# Patient Record
Sex: Male | Born: 1970 | Race: Black or African American | Hispanic: No | Marital: Married | Smoking: Former smoker
Health system: Southern US, Community
[De-identification: ages and names within clinical notes are randomized; demographics above are authoritative.]

## PROBLEM LIST (undated history)

## (undated) DIAGNOSIS — G4730 Sleep apnea, unspecified: Secondary | ICD-10-CM

## (undated) DIAGNOSIS — E785 Hyperlipidemia, unspecified: Secondary | ICD-10-CM

## (undated) DIAGNOSIS — K219 Gastro-esophageal reflux disease without esophagitis: Secondary | ICD-10-CM

## (undated) DIAGNOSIS — I10 Essential (primary) hypertension: Secondary | ICD-10-CM

## (undated) DIAGNOSIS — K760 Fatty (change of) liver, not elsewhere classified: Secondary | ICD-10-CM

## (undated) DIAGNOSIS — Z9989 Dependence on other enabling machines and devices: Secondary | ICD-10-CM

## (undated) DIAGNOSIS — G4733 Obstructive sleep apnea (adult) (pediatric): Secondary | ICD-10-CM

## (undated) DIAGNOSIS — M1990 Unspecified osteoarthritis, unspecified site: Secondary | ICD-10-CM

## (undated) DIAGNOSIS — I517 Cardiomegaly: Secondary | ICD-10-CM

## (undated) DIAGNOSIS — L409 Psoriasis, unspecified: Secondary | ICD-10-CM

## (undated) DIAGNOSIS — R011 Cardiac murmur, unspecified: Secondary | ICD-10-CM

## (undated) HISTORY — DX: Hyperlipidemia, unspecified: E78.5

## (undated) HISTORY — DX: Obstructive sleep apnea (adult) (pediatric): G47.33

## (undated) HISTORY — PX: CARDIAC CATHETERIZATION: SHX172

## (undated) HISTORY — DX: Unspecified osteoarthritis, unspecified site: M19.90

## (undated) HISTORY — DX: Gastro-esophageal reflux disease without esophagitis: K21.9

## (undated) HISTORY — DX: Cardiomegaly: I51.7

## (undated) HISTORY — PX: INGUINAL HERNIA REPAIR: SUR1180

## (undated) HISTORY — DX: Psoriasis, unspecified: L40.9

## (undated) HISTORY — DX: Dependence on other enabling machines and devices: Z99.89

## (undated) HISTORY — DX: Cardiac murmur, unspecified: R01.1

## (undated) HISTORY — DX: Fatty (change of) liver, not elsewhere classified: K76.0

## (undated) HISTORY — DX: Sleep apnea, unspecified: G47.30

## (undated) HISTORY — DX: Essential (primary) hypertension: I10

---

## 1999-05-17 ENCOUNTER — Ambulatory Visit (HOSPITAL_COMMUNITY): Admission: RE | Admit: 1999-05-17 | Discharge: 1999-05-17 | Payer: Self-pay | Admitting: Specialist

## 1999-05-17 ENCOUNTER — Encounter: Payer: Self-pay | Admitting: Specialist

## 2002-04-11 ENCOUNTER — Encounter: Payer: Self-pay | Admitting: Family Medicine

## 2002-04-11 ENCOUNTER — Ambulatory Visit (HOSPITAL_COMMUNITY): Admission: RE | Admit: 2002-04-11 | Discharge: 2002-04-11 | Payer: Self-pay | Admitting: Family Medicine

## 2003-10-11 ENCOUNTER — Ambulatory Visit (HOSPITAL_BASED_OUTPATIENT_CLINIC_OR_DEPARTMENT_OTHER): Admission: RE | Admit: 2003-10-11 | Discharge: 2003-10-11 | Payer: Self-pay | Admitting: *Deleted

## 2005-02-16 ENCOUNTER — Encounter (INDEPENDENT_AMBULATORY_CARE_PROVIDER_SITE_OTHER): Payer: Self-pay | Admitting: Specialist

## 2005-02-16 ENCOUNTER — Ambulatory Visit (HOSPITAL_COMMUNITY): Admission: RE | Admit: 2005-02-16 | Discharge: 2005-02-16 | Payer: Self-pay | Admitting: Otolaryngology

## 2005-02-16 ENCOUNTER — Ambulatory Visit (HOSPITAL_BASED_OUTPATIENT_CLINIC_OR_DEPARTMENT_OTHER): Admission: RE | Admit: 2005-02-16 | Discharge: 2005-02-16 | Payer: Self-pay | Admitting: Otolaryngology

## 2008-07-30 DIAGNOSIS — I517 Cardiomegaly: Secondary | ICD-10-CM

## 2008-07-30 DIAGNOSIS — G4733 Obstructive sleep apnea (adult) (pediatric): Secondary | ICD-10-CM

## 2008-07-30 HISTORY — DX: Cardiomegaly: I51.7

## 2008-07-30 HISTORY — DX: Obstructive sleep apnea (adult) (pediatric): G47.33

## 2009-01-14 ENCOUNTER — Ambulatory Visit: Payer: Self-pay | Admitting: Vascular Surgery

## 2009-03-26 ENCOUNTER — Encounter: Admission: RE | Admit: 2009-03-26 | Discharge: 2009-03-26 | Payer: Self-pay | Admitting: Rheumatology

## 2009-08-26 ENCOUNTER — Encounter: Admission: RE | Admit: 2009-08-26 | Discharge: 2009-08-26 | Payer: Self-pay | Admitting: Cardiology

## 2009-09-09 ENCOUNTER — Encounter: Admission: RE | Admit: 2009-09-09 | Discharge: 2009-09-09 | Payer: Self-pay | Admitting: Cardiology

## 2010-03-31 ENCOUNTER — Emergency Department (HOSPITAL_COMMUNITY): Admission: EM | Admit: 2010-03-31 | Discharge: 2010-03-31 | Payer: Self-pay | Admitting: Emergency Medicine

## 2010-08-20 ENCOUNTER — Encounter: Payer: Self-pay | Admitting: Cardiology

## 2010-12-12 NOTE — Procedures (Signed)
DUPLEX DEEP VENOUS EXAM - LOWER EXTREMITY   INDICATION:  Left lower extremity pain and swelling.   HISTORY:  Edema:  Yes.  Trauma/Surgery:  No.  Pain:  Yes.  PE:  No.  Previous DVT:  No.  Anticoagulants:  None.  Other:   DUPLEX EXAM:                CFV   SFV   PopV  PTV    GSV                R  L  R  L  R  L  R   L  R  L  Thrombosis    o  o     o     o      o     o  Spontaneous   +  +     +     +      +     +  Phasic        +  +     +     +      +     +  Augmentation  +  +     +     +      +     +  Compressible  +  +     +     +      +     +  Competent     +  +     +     +      +     +   Legend:  + - yes  o - no  p - partial  D - decreased   IMPRESSION:  No evidence of deep venous thrombosis noted in the left  leg.   Left a message on [employer], the nurse, answering machine.    _____________________________  Quita Skye. Hart Rochester, M.D.   MG/MEDQ  D:  01/14/2009  T:  01/14/2009  Job:  [id number]

## 2010-12-15 NOTE — Op Note (Signed)
NAMEEDITH, LORD             ACCOUNT NO.:  [id number]   MEDICAL RECORD NO.:  [id number]          PATIENT TYPE:  AMB   LOCATION:  DSC                          FACILITY:  [facility]   PHYSICIAN:  Christopher E. Ezzard Standing, M.D.DATE OF BIRTH:  06/07/1971   DATE OF PROCEDURE:  02/16/2005  DATE OF DISCHARGE:                                 OPERATIVE REPORT   PREOPERATIVE DIAGNOSIS:  Submental mass.   POSTOPERATIVE DIAGNOSIS:  Submental mass consistent with submental lymph  nodes.   OPERATION:  Excision of submental mass.   SURGEON:  Kristine Garbe. Ezzard Standing, M.D.   ANESTHESIA:  General endotracheal anesthesia.   COMPLICATIONS:  None.   BRIEF CLINICAL NOTE:  Erick Murin is a 40 year old gentleman who has had  a mass underneath his chin in the submental crease region for about two  months.  On examination, he has approximately 2 to 2.5 cm submental firm  mass, questionable cyst.  He was taken to the operating room for excisional  biopsy of a submental mass.   DESCRIPTION OF PROCEDURE:  Patient was brought to the operating room, placed  under general endotracheal anesthesia and given 1 g Ancef IV preoperatively.  Neck was prepped with Betadine solution and draped out with sterile towels.  An incision was made in a horizontal fashion directly over the mass.  Dissection was carried down through the subcutaneous tissue and the platysma  muscle.  Mass was found just beneath the platysma muscle and was consistent  with probable large lymph node.  There was an additional separate enlarged  lymph node that was removed also.  Hemostasis was obtained with the cautery.  The lymph nodes were sent in saline as fresh specimens to pathology.  After  obtaining adequate hemostasis, the wound was closed with 3-0 chromic sutures  subcutaneously and 5-0 nylon to reapproximate the skin edges.  Dressing was  applied.  The patient was awoken from anesthesia and transferred to the  recovery room  postoperatively doing well.   DISPOSITION:  Marlan is discharged home later this morning on Tylenol and  Tylenol No. 3 p.r.n. pain.  Will have him follow up in my office in one week  for recheck, review pathology and have sutures removed.        CEN/MEDQ  D:  02/16/2005  T:  02/16/2005  Job:  [id number]   cc:   Caryn Bee L. Little, M.D.  [address]  [place]  [place] [address]  Fax: [phone]

## 2010-12-19 ENCOUNTER — Ambulatory Visit: Payer: Self-pay | Admitting: Internal Medicine

## 2011-01-12 ENCOUNTER — Encounter: Payer: Self-pay | Admitting: *Deleted

## 2011-01-12 ENCOUNTER — Encounter: Payer: Self-pay | Admitting: Internal Medicine

## 2011-01-12 ENCOUNTER — Ambulatory Visit (INDEPENDENT_AMBULATORY_CARE_PROVIDER_SITE_OTHER): Payer: PRIVATE HEALTH INSURANCE | Admitting: Internal Medicine

## 2011-01-12 DIAGNOSIS — Z23 Encounter for immunization: Secondary | ICD-10-CM

## 2011-01-12 DIAGNOSIS — R011 Cardiac murmur, unspecified: Secondary | ICD-10-CM | POA: Insufficient documentation

## 2011-01-12 DIAGNOSIS — R05 Cough: Secondary | ICD-10-CM

## 2011-01-12 DIAGNOSIS — M79673 Pain in unspecified foot: Secondary | ICD-10-CM | POA: Insufficient documentation

## 2011-01-12 DIAGNOSIS — G4733 Obstructive sleep apnea (adult) (pediatric): Secondary | ICD-10-CM | POA: Insufficient documentation

## 2011-01-12 DIAGNOSIS — Z9989 Dependence on other enabling machines and devices: Secondary | ICD-10-CM | POA: Insufficient documentation

## 2011-01-12 DIAGNOSIS — I517 Cardiomegaly: Secondary | ICD-10-CM

## 2011-01-12 DIAGNOSIS — R059 Cough, unspecified: Secondary | ICD-10-CM

## 2011-01-12 DIAGNOSIS — W57XXXA Bitten or stung by nonvenomous insect and other nonvenomous arthropods, initial encounter: Secondary | ICD-10-CM | POA: Insufficient documentation

## 2011-01-12 DIAGNOSIS — L408 Other psoriasis: Secondary | ICD-10-CM

## 2011-01-12 DIAGNOSIS — Z0000 Encounter for general adult medical examination without abnormal findings: Secondary | ICD-10-CM | POA: Insufficient documentation

## 2011-01-12 DIAGNOSIS — T148XXA Other injury of unspecified body region, initial encounter: Secondary | ICD-10-CM

## 2011-01-12 DIAGNOSIS — T148 Other injury of unspecified body region: Secondary | ICD-10-CM

## 2011-01-12 DIAGNOSIS — L409 Psoriasis, unspecified: Secondary | ICD-10-CM

## 2011-01-12 DIAGNOSIS — M79609 Pain in unspecified limb: Secondary | ICD-10-CM

## 2011-01-12 DIAGNOSIS — I10 Essential (primary) hypertension: Secondary | ICD-10-CM

## 2011-01-12 LAB — CBC WITH DIFFERENTIAL/PLATELET
Basophils Absolute: 0 10*3/uL (ref 0.0–0.1)
Eosinophils Relative: 3.7 % (ref 0.0–5.0)
HCT: 48.5 % (ref 39.0–52.0)
Hemoglobin: 16.5 g/dL (ref 13.0–17.0)
Lymphs Abs: 2 10*3/uL (ref 0.7–4.0)
MCV: 88.8 fl (ref 78.0–100.0)
Monocytes Absolute: 0.4 10*3/uL (ref 0.1–1.0)
Monocytes Relative: 7.3 % (ref 3.0–12.0)
Neutro Abs: 3.3 10*3/uL (ref 1.4–7.7)
RDW: 14.1 % (ref 11.5–14.6)

## 2011-01-12 LAB — LIPID PANEL
Cholesterol: 211 mg/dL — ABNORMAL HIGH (ref 0–200)
Total CHOL/HDL Ratio: 4

## 2011-01-12 LAB — COMPREHENSIVE METABOLIC PANEL
Albumin: 4.3 g/dL (ref 3.5–5.2)
Alkaline Phosphatase: 75 U/L (ref 39–117)
BUN: 13 mg/dL (ref 6–23)
CO2: 28 mEq/L (ref 19–32)
Calcium: 9.3 mg/dL (ref 8.4–10.5)
Chloride: 106 mEq/L (ref 96–112)
GFR: 117.06 mL/min (ref >60.00)
Glucose, Bld: 79 mg/dL (ref 70–99)
Potassium: 4.8 mEq/L (ref 3.5–5.1)
Sodium: 140 mEq/L (ref 135–145)
Total Protein: 7.4 g/dL (ref 6.0–8.3)

## 2011-01-12 MED ORDER — HYDROCHLOROTHIAZIDE 25 MG PO TABS: 25.0000 mg | ORAL_TABLET | Freq: Every day | ORAL | Status: DC

## 2011-01-12 MED ORDER — LISINOPRIL 40 MG PO TABS: 40.0000 mg | ORAL_TABLET | Freq: Every day | ORAL | Status: DC

## 2011-01-12 MED ORDER — AZITHROMYCIN 250 MG PO TABS: ORAL_TABLET | ORAL | Status: AC

## 2011-01-12 NOTE — Assessment & Plan Note (Signed)
Reports history of cardiomegaly, and has been seen by cardiology at [facility] many times, needs a new cardiologist. Today he seems to be stable from the cardiac standpoint. Plan: Refer to cardiology Record

## 2011-01-12 NOTE — Assessment & Plan Note (Addendum)
Td today  never had a cscope Labs including a uric acid, patient likes to be tested for gout. Diet, exercise discussed

## 2011-01-12 NOTE — Assessment & Plan Note (Signed)
BP today well controlled

## 2011-01-12 NOTE — Assessment & Plan Note (Signed)
Recommend observation, will call if he develops a rash

## 2011-01-12 NOTE — Patient Instructions (Addendum)
Please sign a release of information and get the records from your previous doctors Rest, fluids , tylenol For cough, take Mucinex DM twice a day as needed  Take the antibiotic as prescribed. Take zithromax x 5 days  Call if no better in few days Call anytime if the symptoms increase (cough, chest congestion)

## 2011-01-12 NOTE — Assessment & Plan Note (Signed)
Plantar fascia is? Stretching discussed

## 2011-01-12 NOTE — Assessment & Plan Note (Signed)
Referred to a new dermatologist

## 2011-01-12 NOTE — Progress Notes (Signed)
  Subjective:    Patient ID: Jacob Cannon, male    DOB: 02-04-1971, 40 y.o.   MRN: [id number]  HPI CPX New patient, here with his wife. He has been unable to see his previous doctors due to an insurance issue. He used to be seen @ [facility]. Needs a referral to a new heart doctor and a dermatologist  Past Medical History  Diagnosis Date  . Hypertension   . Arthritis     type?  . Psoriasis     used to see derm   . Heart murmur congenital  . OSA on CPAP 2010  . Cardiomegaly 2010    [facility]   Past Surgical History  Procedure Date  . Hernia repair     R inguinal   Family History  Problem Relation Age of Onset  . Multiple sclerosis Mother   . Arthritis    . Heart disease      GM, father , brother (early age)  . Diabetes      father   . Colon cancer Neg Hx   . Prostate cancer Neg Hx    History   Social History  . Marital Status: Single    Spouse Name: N/A    Number of Children: 3  . Years of Education: N/A   Occupational History  .  [employer]   Social History Main Topics  . Smoking status: Never Smoker   . Smokeless tobacco: Not on file  . Alcohol Use: Yes     wine- occasionally  . Drug Use: No  . Sexually Active: Not on file   Other Topics Concern  . Not on file   Social History Narrative   3 biological kids and 5 adopted---    Review of Systems For one month, he has been coughing a lot, he has noted some chest congestion and occasional wheezing. He is producing green sputum. Last week, he had episode of sore throat, nausea some fever--those symptoms are resolved. Occasional short of breath with cough. He has ongoing chest pain, has been seeing cardiology for that, reports a stress test recently. Denies any abdominal pain or blood in the stools. Denies dysuria or gross hematuria. Complaining of right heel pain for a while. Over-the-counter medicines have not helped much. Yesterday he found a tick on the right scrotum, he showed to me, is 2  mm, not engorged     Objective:   Physical Exam  Constitutional: He appears well-developed. No distress.  HENT:  Head: Normocephalic and atraumatic.  Eyes: No scleral icterus.  Neck: No thyromegaly present.  Cardiovascular: Normal rate, regular rhythm and normal heart sounds.   No murmur heard. Pulmonary/Chest: Effort normal. No respiratory distress. He has no wheezes.       Few rhonchi  Abdominal: Soft. He exhibits no distension. There is no tenderness. There is no rebound and no guarding.  Musculoskeletal: He exhibits no edema.       Palpation of the right heel cause some pain  Skin: He is not diaphoretic.       Area of the tick bite is in the right scrotum, no lesions, no rash. Multiple skin lesions consistent with history of psoriasis          Assessment & Plan:  Today , I spent more than 20 min with the patient in addition of CPX addressing other  Issues (see a/p)

## 2011-01-15 DIAGNOSIS — R059 Cough, unspecified: Secondary | ICD-10-CM | POA: Insufficient documentation

## 2011-01-15 DIAGNOSIS — R05 Cough: Secondary | ICD-10-CM | POA: Insufficient documentation

## 2011-01-15 NOTE — Assessment & Plan Note (Signed)
Bronchitis: See instructions 

## 2011-02-09 ENCOUNTER — Encounter: Payer: Self-pay | Admitting: *Deleted

## 2011-02-09 ENCOUNTER — Encounter: Payer: Self-pay | Admitting: Cardiology

## 2011-02-12 ENCOUNTER — Institutional Professional Consult (permissible substitution): Payer: PRIVATE HEALTH INSURANCE | Admitting: Cardiology

## 2011-04-06 ENCOUNTER — Institutional Professional Consult (permissible substitution): Payer: PRIVATE HEALTH INSURANCE | Admitting: Cardiology

## 2011-04-20 ENCOUNTER — Encounter: Payer: Self-pay | Admitting: Cardiology

## 2011-04-20 ENCOUNTER — Ambulatory Visit (INDEPENDENT_AMBULATORY_CARE_PROVIDER_SITE_OTHER): Payer: PRIVATE HEALTH INSURANCE | Admitting: Cardiology

## 2011-04-20 DIAGNOSIS — I10 Essential (primary) hypertension: Secondary | ICD-10-CM

## 2011-04-20 DIAGNOSIS — R079 Chest pain, unspecified: Secondary | ICD-10-CM | POA: Insufficient documentation

## 2011-04-20 DIAGNOSIS — I517 Cardiomegaly: Secondary | ICD-10-CM

## 2011-04-20 MED ORDER — LISINOPRIL 40 MG PO TABS: 40.0000 mg | ORAL_TABLET | Freq: Every day | ORAL | Status: DC

## 2011-04-20 NOTE — Progress Notes (Signed)
HPI: 40 yo male for evaluation of cardiac enlargement. Apparently seen at [facility] in the past; no records available. Patient apparently has had intermittent chest pain for years. The pain is in the left breast area. It occurs usually with exertion or at rest. It lasts approximately 2 minutes and resolves spontaneously. It can increase with deep inspiration. No radiation. There is diaphoresis. He has some dyspnea on exertion but no orthopnea, PND or significant pedal edema. No syncope. He apparently had a cardiac catheterization in the remote past that was unremarkable. Because of his cardiac history and chest pain cardiology was asked to evaluate.  Current Outpatient Prescriptions  Medication Sig Dispense Refill  . lisinopril (PRINIVIL,ZESTRIL) 40 MG tablet Take 1 tablet (40 mg total) by mouth daily.  30 tablet  5  . Methotrexate Sodium (METHOTREXATE PO) Take 4 tablets by mouth once a week.        . hydrochlorothiazide 25 MG tablet Take 1 tablet (25 mg total) by mouth daily.  30 tablet  5    No Known Allergies  Past Medical History  Diagnosis Date  . Hypertension   . Arthritis     type?  . Psoriasis     used to see derm   . Heart murmur congenital  . OSA on CPAP 2010  . Cardiomegaly 2010    [facility]  . Hyperlipidemia     Past Surgical History  Procedure Date  . Hernia repair     R inguinal    History   Social History  . Marital Status: Single    Spouse Name: N/A    Number of Children: 3  . Years of Education: N/A   Occupational History  .  [employer]   Social History Main Topics  . Smoking status: Former Smoker    Quit date: 04/19/2001  . Smokeless tobacco: Not on file  . Alcohol Use: Yes     wine- occasionally  . Drug Use: No  . Sexually Active: Not on file   Other Topics Concern  . Not on file   Social History Narrative   3 biological kids and 5 adopted---    Family History  Problem Relation Age of Onset  . Multiple sclerosis Mother     . Arthritis    . Heart disease      GM, father , brother (early age)  . Diabetes      father   . Colon cancer Neg Hx   . Prostate cancer Neg Hx     ROS: no fevers or chills, productive cough, hemoptysis, dysphasia, odynophagia, melena, hematochezia, dysuria, hematuria, rash, seizure activity, orthopnea, PND, pedal edema, claudication. Remaining systems are negative.  Physical Exam: General:  Well developed/well nourished in NAD Skin warm/dry Patient not depressed No peripheral clubbing Back-normal HEENT-normal/normal eyelids Neck supple/normal carotid upstroke bilaterally; no bruits; no JVD; no thyromegaly chest - CTA/ normal expansion CV - RRR/normal S1 and S2; no murmurs, rubs or gallops;  PMI nondisplaced Abdomen -NT/ND, no HSM, no mass, + bowel sounds, no bruit 2+ femoral pulses, no bruits Ext-no edema, chords, 2+ DP Neuro-grossly nonfocal  ECG NSR, Normal axis, RVCD, Diffuse TWI, prolonged QT

## 2011-04-20 NOTE — Assessment & Plan Note (Signed)
Blood pressure controlled. Continue present medications. 

## 2011-04-20 NOTE — Patient Instructions (Signed)
Your physician recommends that you schedule a follow-up appointment in: 3 months.  

## 2011-04-20 NOTE — Assessment & Plan Note (Signed)
Patient apparently with history of cardiomegaly and murmur. I will obtain all records from [facility] cardiology. We will make further decisions concerning workup based on those records.

## 2011-04-20 NOTE — Assessment & Plan Note (Signed)
Symptoms atypical and apparently chronic. Await records concerning previous evaluation.

## 2011-05-12 ENCOUNTER — Telehealth: Payer: Self-pay | Admitting: Internal Medicine

## 2011-05-12 NOTE — Telephone Encounter (Signed)
A number of records reviewed , relevant ones will be scanned , the rest will go back to pt for safe keeping: H/o cardiac cath 2005 for CP: negative ECHO 06-2007: Normal EF ECHO 11-12-08: diastolic dysfunction Stress test 05-13-09 neg, hypertensive response Has seen before Dr Nickola Major , serologic w/u neg for inflamatory arthritis

## 2011-05-14 ENCOUNTER — Telehealth: Payer: Self-pay | Admitting: *Deleted

## 2011-05-14 NOTE — Telephone Encounter (Signed)
LMOM for patient to inform that old medical records can be picked up at their convenience. Contact number left to call if patient prefers records to be mailed.

## 2011-06-08 ENCOUNTER — Emergency Department (INDEPENDENT_AMBULATORY_CARE_PROVIDER_SITE_OTHER): Payer: PRIVATE HEALTH INSURANCE

## 2011-06-08 ENCOUNTER — Emergency Department (HOSPITAL_BASED_OUTPATIENT_CLINIC_OR_DEPARTMENT_OTHER)
Admission: EM | Admit: 2011-06-08 | Discharge: 2011-06-08 | Disposition: A | Discharge status: Home / Self Care | Payer: PRIVATE HEALTH INSURANCE | Attending: Emergency Medicine | Admitting: Emergency Medicine

## 2011-06-08 ENCOUNTER — Encounter (HOSPITAL_BASED_OUTPATIENT_CLINIC_OR_DEPARTMENT_OTHER): Payer: Self-pay | Admitting: *Deleted

## 2011-06-08 DIAGNOSIS — G4733 Obstructive sleep apnea (adult) (pediatric): Secondary | ICD-10-CM | POA: Insufficient documentation

## 2011-06-08 DIAGNOSIS — S0100XA Unspecified open wound of scalp, initial encounter: Secondary | ICD-10-CM

## 2011-06-08 DIAGNOSIS — I10 Essential (primary) hypertension: Secondary | ICD-10-CM | POA: Insufficient documentation

## 2011-06-08 DIAGNOSIS — I517 Cardiomegaly: Secondary | ICD-10-CM | POA: Insufficient documentation

## 2011-06-08 DIAGNOSIS — S0180XA Unspecified open wound of other part of head, initial encounter: Secondary | ICD-10-CM | POA: Insufficient documentation

## 2011-06-08 DIAGNOSIS — S0990XA Unspecified injury of head, initial encounter: Secondary | ICD-10-CM

## 2011-06-08 DIAGNOSIS — Z8739 Personal history of other diseases of the musculoskeletal system and connective tissue: Secondary | ICD-10-CM | POA: Insufficient documentation

## 2011-06-08 DIAGNOSIS — W1809XA Striking against other object with subsequent fall, initial encounter: Secondary | ICD-10-CM | POA: Insufficient documentation

## 2011-06-08 DIAGNOSIS — W010XXA Fall on same level from slipping, tripping and stumbling without subsequent striking against object, initial encounter: Secondary | ICD-10-CM

## 2011-06-08 DIAGNOSIS — E785 Hyperlipidemia, unspecified: Secondary | ICD-10-CM | POA: Insufficient documentation

## 2011-06-08 DIAGNOSIS — S0181XA Laceration without foreign body of other part of head, initial encounter: Secondary | ICD-10-CM

## 2011-06-08 MED ORDER — LIDOCAINE-EPINEPHRINE 2 %-1:100000 IJ SOLN
INTRAMUSCULAR | Status: AC
  Administered 2011-06-08: 1 mL
  Filled 2011-06-08: qty 1

## 2011-06-08 MED ORDER — TETANUS-DIPHTH-ACELL PERTUSSIS 5-2.5-18.5 LF-MCG/0.5 IM SUSP: 0.5000 mL | Freq: Once | INTRAMUSCULAR | Status: DC

## 2011-06-08 NOTE — ED Notes (Signed)
Tripped and fell onto a metal beam. 3" laceration to his forehead. Bleeding controlled. Pain on the left side of his face. Alert oriented. No LOC.

## 2011-06-08 NOTE — ED Notes (Signed)
Pt states he had a DT 2 months ago

## 2011-06-08 NOTE — ED Provider Notes (Signed)
History     CSN: [id number] Arrival date & time: 06/08/2011  1:45 PM   First MD Initiated Contact with Patient 06/08/11 1357      Chief Complaint  Patient presents with  . Head Laceration    (Consider location/radiation/quality/duration/timing/severity/associated sxs/prior treatment) HPI Patient was at work today. Patient tripped and fell hitting his head onto a metal beam. Patient sustained a laceration across his forehead. Patient did not lose consciousness however he has been somewhat dazed and drowsy since the injury. Patient denies any neck pain. He denies any numbness or weakness. There's been no fevers or vomiting. The pain is increased with palpation. It has been constant since the injury. Past Medical History  Diagnosis Date  . Hypertension   . Arthritis     type?  . Psoriasis     used to see derm   . Heart murmur congenital  . OSA on CPAP 2010  . Cardiomegaly 2010    [facility]  . Hyperlipidemia     Past Surgical History  Procedure Date  . Hernia repair     R inguinal    Family History  Problem Relation Age of Onset  . Multiple sclerosis Mother   . Arthritis    . Heart disease      GM, father , brother (early age)  . Diabetes      father   . Colon cancer Neg Hx   . Prostate cancer Neg Hx     History  Substance Use Topics  . Smoking status: Former Smoker    Quit date: 04/19/2001  . Smokeless tobacco: Not on file  . Alcohol Use: Yes     wine- occasionally      Review of Systems  All other systems reviewed and are negative.    Allergies  Review of patient's allergies indicates no known allergies.  Home Medications   Current Outpatient Rx  Name Route Sig Dispense Refill  . HYDROCHLOROTHIAZIDE 25 MG PO TABS Oral Take 1 tablet (25 mg total) by mouth daily. 30 tablet 5  . LISINOPRIL 40 MG PO TABS Oral Take 1 tablet (40 mg total) by mouth daily. 30 tablet 12  . METHOTREXATE PO Oral Take 4 tablets by mouth once a week.        BP  153/89  Pulse 87  Temp(Src) 98.2 F (36.8 C) (Oral)  Resp 20  SpO2 99%  Physical Exam  Nursing note and vitals reviewed. Constitutional: He appears well-developed and well-nourished. No distress.  HENT:  Head: Normocephalic.    Right Ear: External ear normal.  Left Ear: External ear normal.  Eyes: Conjunctivae are normal. Right eye exhibits no discharge. Left eye exhibits no discharge. No scleral icterus.  Neck: Neck supple. No tracheal deviation present.  Cardiovascular: Normal rate, regular rhythm and intact distal pulses.   Pulmonary/Chest: Effort normal and breath sounds normal. No stridor. No respiratory distress. He has no wheezes. He has no rales.  Abdominal: Soft. Bowel sounds are normal. He exhibits no distension. There is no tenderness. There is no rebound and no guarding.  Musculoskeletal: He exhibits no edema and no tenderness.       Cervical back: He exhibits no tenderness.       Thoracic back: He exhibits no tenderness.       Lumbar back: He exhibits no tenderness.  Neurological: He is alert. He has normal strength. No sensory deficit. Cranial nerve deficit:  no gross defecits noted. He exhibits normal muscle tone. He displays no  seizure activity. Coordination normal.  Skin: Skin is warm and dry. No rash noted.  Psychiatric: He has a normal mood and affect.    ED Course  LACERATION REPAIR Performed by: Jacob Cannon Authorized by: Jacob Cannon R Consent: Verbal consent obtained. Consent given by: patient Patient understanding: patient states understanding of the procedure being performed Patient identity confirmed: verbally with patient Body area: head/neck Location details: forehead Laceration length: 3 cm Tendon involvement: none Nerve involvement: none Vascular damage: no Anesthesia: local infiltration Local anesthetic: lidocaine 1% with epinephrine Anesthetic total: 10 ml Patient sedated: no Preparation: Patient was prepped and draped in the usual  sterile fashion. Irrigation solution: saline Irrigation method: syringe Amount of cleaning: standard Debridement: none Degree of undermining: none Skin closure: 5-0 Prolene Subcutaneous closure: 4-0 Vicryl Number of sutures: 9 Technique: simple Approximation: close Approximation difficulty: complex Patient tolerance: Patient tolerated the procedure well with no immediate complications.   (including critical care time)  Labs Reviewed - No data to display Ct Head Wo Contrast  06/08/2011  *RADIOLOGY REPORT*  Clinical Data: Laceration, blunt trauma  CT HEAD WITHOUT CONTRAST  Technique:  Contiguous axial images were obtained from the base of the skull through the vertex without contrast.  Comparison: Head CT 03/31/2010  Findings: No intracranial hemorrhage.  No parenchymal contusion. No midline shift or mass effect.  Basilar cisterns are patent.  No skull base fracture.  No fluid in the paranasal sinuses.  Orbits are normal.  There is a small laceration over the anterior left frontal bone.  IMPRESSION: No intracranial trauma.  Original Report Authenticated By: Jacob Cannon, M.D.     MDM  Patient does not show any signs of significant head injury on the CT scan. His laceration was. Without complications. Patient will be discharged home with recommendations for suture removal in 5 days.      Diagnosis: Laceration #2 head injury  Jacob Kras, MD 06/08/11 1547

## 2011-07-06 ENCOUNTER — Ambulatory Visit (INDEPENDENT_AMBULATORY_CARE_PROVIDER_SITE_OTHER): Payer: PRIVATE HEALTH INSURANCE | Admitting: Cardiology

## 2011-07-06 ENCOUNTER — Encounter: Payer: Self-pay | Admitting: Cardiology

## 2011-07-06 DIAGNOSIS — I10 Essential (primary) hypertension: Secondary | ICD-10-CM

## 2011-07-06 DIAGNOSIS — R079 Chest pain, unspecified: Secondary | ICD-10-CM

## 2011-07-06 NOTE — Progress Notes (Signed)
HPI:40 yo male I saw in Sept of 2012 for evaluation of cardiac enlargement. Seen at [facility] in the past. Patient apparently has had intermittent chest pain for years. Review of his records from [facility] cardiology shows cardiac catheterization in 2004 revealed normal coronary arteries, an echocardiogram in April of 2012 that showed normal LV function and grade 2 diastolic dysfunction, renal Doppler in February of 2011 showed no stenosis. Myoview in October of 2010 showed abnormal ECG but perfusion was normal with an ejection fraction of 68%. Previous WU for pheochromocytoma negative. Since I last saw him, he continues to have occasional chest pain which he states has been present intermittently for 12 years. He notices his symptoms mainly at night but there is no associated water brash. He does not have exertional chest pain. No dyspnea or syncope.  Current Outpatient Prescriptions  Medication Sig Dispense Refill  . hydrochlorothiazide 25 MG tablet Take 1 tablet (25 mg total) by mouth daily.  30 tablet  5  . lisinopril (PRINIVIL,ZESTRIL) 40 MG tablet Take 1 tablet (40 mg total) by mouth daily.  30 tablet  12  . Methotrexate Sodium (METHOTREXATE PO) Take 6 tablets by mouth once a week.          Past Medical History  Diagnosis Date  . Hypertension   . Arthritis     type?  . Psoriasis     used to see derm   . Heart murmur congenital  . OSA on CPAP 2010  . Cardiomegaly 2010    [facility]  . Hyperlipidemia     Past Surgical History  Procedure Date  . Hernia repair     R inguinal    History   Social History  . Marital Status: Single    Spouse Name: N/A    Number of Children: 3  . Years of Education: N/A   Occupational History  .  [employer]   Social History Main Topics  . Smoking status: Former Smoker    Quit date: 04/19/2001  . Smokeless tobacco: Not on file  . Alcohol Use: Yes     wine- occasionally  . Drug Use: No  . Sexually Active: Not on file   Other  Topics Concern  . Not on file   Social History Narrative   3 biological kids and 5 adopted---    ROS: no fevers or chills, productive cough, hemoptysis, dysphasia, odynophagia, melena, hematochezia, dysuria, hematuria, rash, seizure activity, orthopnea, PND, pedal edema, claudication. Remaining systems are negative.  Physical Exam: Well-developed well-nourished in no acute distress.  Skin is warm and dry.  HEENT is normal.  Neck is supple. No thyromegaly.  Chest is clear to auscultation with normal expansion.  Cardiovascular exam is regular rate and rhythm.  Abdominal exam nontender or distended. No masses palpated. Extremities show no edema. neuro grossly intact  ECG normal sinus rhythm at a rate of 72. Occasional fusion complex. Right axis deviation. RV conduction delay. Diffuse T-wave inversion.

## 2011-07-06 NOTE — Patient Instructions (Signed)
Your physician recommends that you schedule a follow-up appointment in: AS NEEDED  Your physician recommends that you continue on your current medications as directed. Please refer to the Current Medication list given to you today.  

## 2011-07-06 NOTE — Assessment & Plan Note (Signed)
Symptoms are chronic and previous workup including cardiac catheterization and stress Myoview. I will not pursue further cardiac evaluation. He does have an abnormal electrocardiogram at baseline and we will provide a copy for him to carry for comparison in the future.

## 2011-07-06 NOTE — Assessment & Plan Note (Signed)
We have discussed the importance of blood pressure control. He will continue on his present medications. Continue risk factor and lifestyle modification.

## 2011-07-13 ENCOUNTER — Ambulatory Visit: Payer: PRIVATE HEALTH INSURANCE | Admitting: Internal Medicine

## 2011-07-13 DIAGNOSIS — Z0289 Encounter for other administrative examinations: Secondary | ICD-10-CM

## 2011-09-11 ENCOUNTER — Observation Stay (HOSPITAL_COMMUNITY)
Admission: EM | Admit: 2011-09-11 | Discharge: 2011-09-13 | Disposition: A | Discharge status: Home / Self Care | Payer: PRIVATE HEALTH INSURANCE | Attending: Internal Medicine | Admitting: Internal Medicine

## 2011-09-11 ENCOUNTER — Other Ambulatory Visit: Payer: Self-pay

## 2011-09-11 ENCOUNTER — Emergency Department (HOSPITAL_COMMUNITY): Payer: PRIVATE HEALTH INSURANCE

## 2011-09-11 ENCOUNTER — Encounter (HOSPITAL_COMMUNITY): Payer: Self-pay | Admitting: *Deleted

## 2011-09-11 DIAGNOSIS — I10 Essential (primary) hypertension: Secondary | ICD-10-CM | POA: Insufficient documentation

## 2011-09-11 DIAGNOSIS — R1011 Right upper quadrant pain: Secondary | ICD-10-CM | POA: Insufficient documentation

## 2011-09-11 DIAGNOSIS — R0789 Other chest pain: Principal | ICD-10-CM | POA: Insufficient documentation

## 2011-09-11 DIAGNOSIS — R079 Chest pain, unspecified: Secondary | ICD-10-CM | POA: Diagnosis present

## 2011-09-11 DIAGNOSIS — R011 Cardiac murmur, unspecified: Secondary | ICD-10-CM | POA: Insufficient documentation

## 2011-09-11 DIAGNOSIS — G4733 Obstructive sleep apnea (adult) (pediatric): Secondary | ICD-10-CM | POA: Diagnosis present

## 2011-09-11 DIAGNOSIS — Z79899 Other long term (current) drug therapy: Secondary | ICD-10-CM | POA: Insufficient documentation

## 2011-09-11 DIAGNOSIS — K2990 Gastroduodenitis, unspecified, without bleeding: Secondary | ICD-10-CM | POA: Insufficient documentation

## 2011-09-11 DIAGNOSIS — K2970 Gastritis, unspecified, without bleeding: Secondary | ICD-10-CM | POA: Insufficient documentation

## 2011-09-11 DIAGNOSIS — K219 Gastro-esophageal reflux disease without esophagitis: Secondary | ICD-10-CM | POA: Insufficient documentation

## 2011-09-11 DIAGNOSIS — L409 Psoriasis, unspecified: Secondary | ICD-10-CM | POA: Diagnosis present

## 2011-09-11 DIAGNOSIS — L408 Other psoriasis: Secondary | ICD-10-CM | POA: Insufficient documentation

## 2011-09-11 DIAGNOSIS — E785 Hyperlipidemia, unspecified: Secondary | ICD-10-CM | POA: Insufficient documentation

## 2011-09-11 LAB — BASIC METABOLIC PANEL
BUN: 10 mg/dL (ref 6–23)
CO2: 26 mEq/L (ref 19–32)
Calcium: 9.7 mg/dL (ref 8.4–10.5)
Chloride: 100 mEq/L (ref 96–112)
Creatinine, Ser: 1.01 mg/dL (ref 0.50–1.35)
GFR calc Af Amer: >90 mL/min (ref >90)
GFR calc non Af Amer: >90 mL/min (ref >90)
Glucose, Bld: 92 mg/dL (ref 70–99)
Potassium: 4.3 mEq/L (ref 3.5–5.1)
Sodium: 137 mEq/L (ref 135–145)

## 2011-09-11 LAB — URINALYSIS, ROUTINE W REFLEX MICROSCOPIC
Bilirubin Urine: NEGATIVE
Glucose, UA: NEGATIVE mg/dL
Hgb urine dipstick: NEGATIVE
Ketones, ur: NEGATIVE mg/dL
Leukocytes, UA: NEGATIVE
Nitrite: NEGATIVE
Protein, ur: NEGATIVE mg/dL
Specific Gravity, Urine: 1.015 (ref 1.005–1.030)
Urobilinogen, UA: 0.2 mg/dL (ref 0.0–1.0)
pH: 7.5 (ref 5.0–8.0)

## 2011-09-11 LAB — CBC
HCT: 48.7 % (ref 39.0–52.0)
Hemoglobin: 16.9 g/dL (ref 13.0–17.0)
MCH: 29.9 pg (ref 26.0–34.0)
MCHC: 34.7 g/dL (ref 30.0–36.0)
MCV: 86 fL (ref 78.0–100.0)
Platelets: 259 10*3/uL (ref 150–400)
RBC: 5.66 MIL/uL (ref 4.22–5.81)
RDW: 13 % (ref 11.5–15.5)
WBC: 6.3 10*3/uL (ref 4.0–10.5)

## 2011-09-11 LAB — TROPONIN I
Troponin I: <0.3 ng/mL (ref <0.30)
Troponin I: <0.3 ng/mL (ref <0.30)

## 2011-09-11 MED ORDER — MORPHINE SULFATE 4 MG/ML IJ SOLN
4.0000 mg | Freq: Once | INTRAMUSCULAR | Status: AC
  Administered 2011-09-11: 4 mg via INTRAVENOUS
  Filled 2011-09-11: qty 1

## 2011-09-11 MED ORDER — ASPIRIN 81 MG PO CHEW
324.0000 mg | CHEWABLE_TABLET | Freq: Once | ORAL | Status: AC
  Administered 2011-09-11: 324 mg via ORAL
  Filled 2011-09-11: qty 1

## 2011-09-11 NOTE — ED Provider Notes (Signed)
Patient was seen by a colleague and has a disposition as admitted. Did not receive sign out concerning him in change of shit. Asked by nursing for bed assignment. Cardiology was consulted and earlier evaluated the patient. Per review of their note, it appears that they'll be following the patient has a consult though. Discussed with cards who confirmed this. Discussed with Dr Joneen Roach, hospitalist concerning admission? She pleasantly agreed to see pt in ED.  Raeford Razor, MD 09/16/11 [phone]

## 2011-09-11 NOTE — ED Provider Notes (Signed)
History     CSN: [id number]  Arrival date & time 09/11/11  1010   First MD Initiated Contact with Patient 09/11/11 1039      Chief Complaint  Patient presents with  . Chest Pain  . Abdominal Cramping    (Consider location/radiation/quality/duration/timing/severity/associated sxs/prior treatment) HPI The patient presents to the emergency department for chest pain that started at 5:30 AM.  He states that he was at work and the pain seemed to be getting worse.  He stated he was having nausea and shortness of breath as well.  Patient states the pain seemed to radiate to his left arm and into his back.  Patient denies abdominal pain, weakness, diarrhea, headache, visual changes, numbness, or fever.  Patient states that he did not try anything for his pain.  He states that he does have a cardiologist which he is seen for enlarged heart in the past. Past Medical History  Diagnosis Date  . Hypertension   . Arthritis     type?  . Psoriasis     used to see derm   . Heart murmur congenital  . OSA on CPAP 2010  . Cardiomegaly 2010    [facility]  . Hyperlipidemia     Past Surgical History  Procedure Date  . Hernia repair     R inguinal    Family History  Problem Relation Age of Onset  . Multiple sclerosis Mother   . Arthritis    . Heart disease      GM, father , brother (early age)  . Diabetes      father   . Colon cancer Neg Hx   . Prostate cancer Neg Hx     History  Substance Use Topics  . Smoking status: Former Smoker    Quit date: 04/19/2001  . Smokeless tobacco: Not on file  . Alcohol Use: Yes     wine- occasionally      Review of Systems All pertinent positives and negatives reviewed in the history of present illness  Allergies  Review of patient's allergies indicates no known allergies.  Home Medications     BP 141/84  Pulse 80  Temp(Src) 98.1 F (36.7 C) (Oral)  Resp 16  Ht 5\' 5"  (1.651 m)  Wt 192 lb (87.091 kg)  BMI 31.95 kg/m2  SpO2  100%  Physical Exam  Constitutional: He is oriented to person, place, and time. He appears well-developed and well-nourished. No distress.  HENT:  Head: Normocephalic and atraumatic.  Mouth/Throat: No oropharyngeal exudate.  Eyes: Pupils are equal, round, and reactive to light.  Neck: Normal range of motion. Neck supple.  Cardiovascular: Normal rate, regular rhythm and normal heart sounds.  Exam reveals no gallop and no friction rub.   No murmur heard. Pulmonary/Chest: Effort normal and breath sounds normal. No respiratory distress. He has no wheezes. He has no rales.  Abdominal: Soft. Bowel sounds are normal. There is no tenderness. There is no rebound and no guarding.  Neurological: He is alert and oriented to person, place, and time. Coordination normal.  Skin: Skin is warm and dry. No rash noted.    ED Course  Procedures (including critical care time)      Chest pain - Assessment & Plan Note Info       Author  Note Status  Last Update User  Last Update Date/Time    Lewayne Bunting, MD  Signed  Lewayne Bunting, MD  07/06/2011 4:10 PM  Chest pain - Assessment & Plan Note     Symptoms are chronic and previous workup including cardiac catheterization and stress Myoview. I will not pursue further cardiac evaluation. He does have an abnormal electrocardiogram at baseline and we will provide a copy for him to carry for comparison in the future.     I spoke with [facility] cardiology that the patient and they will be down to assess him for admission.  Patient in stable here in the emergency department and had pain relief with morphine.   MDM  MDM Reviewed: nursing note, previous chart and vitals Reviewed previous: labs, x-ray and ECG Interpretation: labs, ECG and x-ray Consults: cardiology   Date: 09/12/2011  Rate: 82  Rhythm: normal sinus rhythm  QRS Axis: normal  Intervals: normal  ST/T Wave abnormalities: nonspecific ST/T changes  Conduction  Disutrbances:none  Narrative Interpretation: Patient has diffuse ST changes but are compared to old and are unchanged.  Old EKG Reviewed: unchanged           Carlyle Dolly, PA-C 09/12/11 1654

## 2011-09-11 NOTE — ED Notes (Signed)
Pt reports onset of left side chest pain since this am, states radiating down left arm, hurts to take deep breath. Pt reports abdominal cramping/nausea/vomiting x 1 week. Reports diaphoresis, shortness of breath.

## 2011-09-11 NOTE — Consult Note (Signed)
CARDIOLOGY CONSULT NOTE   Patient ID: Jacob Cannon MRN: [id number] DOB/AGE: 41/28/1972 40 y.o.  Admit date: 09/11/2011  Primary Physician   Willow Ora, MD, MD Primary Cardiologist   [facility] - last seen in December 2012 Reason for Consultation   Chest pain  Jacob Cannon is a 41 y.o. male with no history of CAD.  He has approximately a 2 week history of cramping abdominal pain that has been episodic. There has been no association with meals or activity. He has not had any diarrhea or constipation. Except for one day, he has not had nausea or vomiting. The pain would start in his right upper quadrant but mainly focus on the mid abdominal area. His symptoms have gradually worsened over the last 2 weeks. Over the last week, his symptoms have been waking him during the night. He has slept poorly. One day last week he had nausea and one episode of vomiting. He has not had any fevers or chills. His activity level has been decreased because of the pain.  Today, he got up at 4:15 AM as usual. Sometime after 5 AM, he had onset of upper left chest pain. The pain was worse with deep inspiration and some movement of his left shoulder. The pain radiated into his left shoulder and down his left arm. It also goes through to his back. He was not diaphoretic or nauseated. The only shortness of breath was with exertion and because it hurt to take a deep breath. He felt slightly disoriented. There may have been some weakness in his left arm. He drove to the hospital but had to stop and ask for directions on the way because he got lost. In the emergency room, he received aspirin 81 mg x4 and morphine 4 mg. Initially, the pain was a 10/10. Currently it is a 7/10. He has not had this before. He has no history of exertional symptoms. He denies any history of presyncope or syncope. He denies PND or orthopnea.   Past Medical History  Diagnosis Date  . Hypertension   . Arthritis     type?  . Psoriasis     used  to see derm   . Heart murmur congenital  . OSA on CPAP 2010  . Cardiomegaly 2010    [facility]  . Hyperlipidemia      Past Surgical History  Procedure Date  . Hernia repair     R inguinal    No Known Allergies  I have reviewed the patient's current medications.   No current facility-administered medications on file prior to encounter.   Current Outpatient Prescriptions on File Prior to Encounter  Medication Sig Dispense Refill  . hydrochlorothiazide 25 MG tablet Take 1 tablet (25 mg total) by mouth daily.  30 tablet  5   Methotrexate 2.5 mg Take 6 tablets weekly    . lisinopril (PRINIVIL,ZESTRIL) 40 MG tablet Take 1 tablet (40 mg total) by mouth daily.  30 tablet  12    History   Social History  . Marital Status: Married    Spouse Name: N/A    Number of Children: 3  . Years of Education: N/A   Occupational History  .  [employer]   Social History Main Topics  . Smoking status: Former Smoker    Quit date: 04/19/2001  . Smokeless tobacco: No  . Alcohol Use: Yes     wine- occasionally  . Drug Use: No  . Sexually Active: Not on file  Social History Narrative   3 biological kids and 5 adopted---     Problem Relation  . Multiple sclerosis Mother  . Arthritis   . Heart disease     GM, father , brother (early age) - his father died in his early 36s and his brother died in his 44s of cardiac issues. His maternal grandmother also died of heart problems.   . Diabetes     father   . Colon cancer Neg Hx  . Prostate cancer Neg Hx     ROS: He has skin plaques from the psoriasis for which she takes methotrexate. He has occasional musculoskeletal aches and pains. He took mag citrate for the abdominal pain because he thought it might be gas although he denies any recent increase in belching or flatulence. The mag citrate did not change his symptoms. Full 14 point review of systems complete and found to be negative unless listed  above  Physical Exam: Blood  pressure 141/84, pulse 80, temperature 98.1 F (36.7 C), temperature source Oral, resp. rate 16, height 5\' 5"  (1.651 m), weight 192 lb (87.091 kg), SpO2 100.00%.   General: Well developed, well nourished, in no acute distress unless left chest palpated Head: Eyes PERRLA, No xanthomas.   Normocephalic and atraumatic, oropharynx without edema or exudate. Dentition Ok Lungs: Clear bilaterally to auscultation, upper left chest wall tender to palpation  Heart: HRRR S1 S2, soft rub, no gallop, pulses are 2+ & equal all 4 extrem.   Neck: No carotid bruit. No lymphadenopathy. No JVD. Abdomen: Bowel sounds present, abdomen soft and non-tender without masses or hernias noted. Negative Murphy's sign. Msk:  No spine or cva tenderness. Normal strength and tone for age, no joint deformities or effusions. Extremities: No clubbing or cyanosis. No edema.  Neuro: Alert and oriented X 3. No focal deficits noted. Psych:  Good affect, responds appropriately Skin: No rashes noted. Psoriasis plaques are present.  Labs:   Lab Results  Component Value Date   WBC 6.3 09/11/2011   HGB 16.9 09/11/2011   HCT 48.7 09/11/2011   MCV 86.0 09/11/2011   PLT 259 09/11/2011   No results found for this basename: INR in the last 72 hours  Lab 09/11/11 1105  NA 137  K 4.3  CL 100  CO2 26  BUN 10  CREATININE 1.01  CALCIUM 9.7  PROT --  BILITOT --  ALKPHOS --  ALT --  AST --  GLUCOSE 92    Basename 09/11/11 1105  CKTOTAL --  CKMB --  TROPONINI <0.30   No results found for this basename: DDIMER   No results found for this basename: Lipase, amylase   TSH  Date/Time Value Range Status  01/12/2011 10:02 AM 1.12  0.35-5.50 (uIU/mL) Final   Echo: 11/02/2008  Normal EF, no significant valvular abnormalities  Radiology:   Dg Chest 2 View 09/11/2011  *RADIOLOGY REPORT*  Clinical Data: 41 year old male with chest pain, hypertension.  CHEST - 2 VIEW  Comparison: 02/25/2009.  Findings: Stable lung volumes.  Cardiac  size and mediastinal contours are within normal limits.  Visualized tracheal air column is within normal limits.  No pneumothorax, pulmonary edema, pleural effusion or confluent pulmonary opacity. No acute osseous abnormality identified.  IMPRESSION: No acute cardiopulmonary abnormality.  Original Report Authenticated By: Ulla Potash III, M.D.    EKG:  SINUS RHYTHM ~ normal P axis, V-rate 50- 99 PROBABLE RVH W/ SECONDARY REPOL ABNORMALITY ~ prominent R or R' & repol abnrm ABNORMAL T, CONSIDER  ISCHEMIA, LATERAL LEADS ~ T <-0.20mV, I aVL V5 V6 **No change from an ECG dated 07/06/2011  ASSESSMENT AND PLAN:   The patient was seen today by Dr Patty Sermons, the patient evaluated and the data reviewed.  1. chest pain: The patient's cardiac risk factors are family history of premature coronary artery disease and hypertension. He also reportedly has hyperlipidemia but the patient and his wife deny that. It is appropriate to admit him and rule out MI. The situation was discussed with the patient and his wife. Several aspects of his chest pain are atypical. It is appropriate to check an echocardiogram and possibly do a stress test but his ECG has no new changes and we will hold off on invasive testing for now.  2. Abdominal pain: He has a negative Murphy's sign. He has no history of abdominal problems. His symptoms have shown no relation to food and have increased over the last 2 weeks although his abdomen is not tender right now. Consider evaluation by internal medicine or GI. Signed: Theodore Demark 09/11/2011, 2:17 PM Co-Sign MD The history was reviewed in detail with Theodore Demark and with the patient.  Patient examined in detail. His exam is positive for tenderness to palpation on the upper left chest, shoulder and left scapular pain.  The pain is present at rest but worse with movement such as sitting up as well as by deep inspiration. The lungs are clear and the heart reveals no pericardial rub.  There is an  insignificant systolic flow murmur at the base.  Despite his recent history of severe epigastric pain and cramps, today his abdomen is benign and nontender.  His EKG is abnormal with widespread STT abnormalities but is identical to a prior EKG from December 2012 and presumably reflects LVH from his prior poorly controlled hypertension. I doubt an acute myocardial event.  His chest pain is felt to be musculoskeletal.  I doubt pulmonary embolus or aortic dissection but would consider getting a DDimer and if elevated get a CT angio of chest. His recent GI symptoms are unexplained and he may benefit from GI consult. Will update his 2D Echo during this admission. Will follow with you.

## 2011-09-12 ENCOUNTER — Encounter (HOSPITAL_COMMUNITY): Payer: Self-pay | Admitting: Family Medicine

## 2011-09-12 DIAGNOSIS — K2990 Gastroduodenitis, unspecified, without bleeding: Secondary | ICD-10-CM

## 2011-09-12 DIAGNOSIS — R079 Chest pain, unspecified: Secondary | ICD-10-CM

## 2011-09-12 DIAGNOSIS — R072 Precordial pain: Secondary | ICD-10-CM

## 2011-09-12 DIAGNOSIS — R1011 Right upper quadrant pain: Secondary | ICD-10-CM

## 2011-09-12 LAB — HEPATIC FUNCTION PANEL
AST: 27 U/L (ref 0–37)
Albumin: 4 g/dL (ref 3.5–5.2)
Bilirubin, Direct: 0.1 mg/dL (ref 0.0–0.3)

## 2011-09-12 LAB — LIPID PANEL
HDL: 42 mg/dL (ref >39)
LDL Cholesterol: 147 mg/dL — ABNORMAL HIGH (ref 0–99)
Triglycerides: 117 mg/dL (ref <150)
VLDL: 23 mg/dL (ref 0–40)

## 2011-09-12 MED ORDER — HYDROCHLOROTHIAZIDE 25 MG PO TABS
25.0000 mg | ORAL_TABLET | Freq: Every day | ORAL | Status: DC
  Administered 2011-09-12 – 2011-09-13 (×2): 25 mg via ORAL
  Filled 2011-09-12 (×3): qty 1

## 2011-09-12 MED ORDER — PANTOPRAZOLE SODIUM 40 MG PO TBEC
40.0000 mg | DELAYED_RELEASE_TABLET | Freq: Two times a day (BID) | ORAL | Status: DC
  Administered 2011-09-12 – 2011-09-13 (×3): 40 mg via ORAL
  Filled 2011-09-12 (×5): qty 1

## 2011-09-12 MED ORDER — PANTOPRAZOLE SODIUM 40 MG PO TBEC: 40.0000 mg | DELAYED_RELEASE_TABLET | Freq: Every day | ORAL | Status: DC

## 2011-09-12 MED ORDER — ONDANSETRON HCL 4 MG/2ML IJ SOLN: 4.0000 mg | Freq: Four times a day (QID) | INTRAMUSCULAR | Status: DC | PRN

## 2011-09-12 MED ORDER — HYDROCODONE-ACETAMINOPHEN 5-325 MG PO TABS: 1.0000 | ORAL_TABLET | Freq: Four times a day (QID) | ORAL | Status: AC | PRN

## 2011-09-12 MED ORDER — ENOXAPARIN SODIUM 40 MG/0.4ML ~~LOC~~ SOLN
40.0000 mg | SUBCUTANEOUS | Status: DC
  Administered 2011-09-12: 40 mg via SUBCUTANEOUS
  Filled 2011-09-12 (×2): qty 0.4

## 2011-09-12 MED ORDER — LISINOPRIL 40 MG PO TABS
40.0000 mg | ORAL_TABLET | Freq: Every day | ORAL | Status: DC
  Administered 2011-09-12 – 2011-09-13 (×2): 40 mg via ORAL
  Filled 2011-09-12 (×3): qty 1

## 2011-09-12 MED ORDER — PANTOPRAZOLE SODIUM 40 MG PO TBEC
40.0000 mg | DELAYED_RELEASE_TABLET | Freq: Every day | ORAL | Status: DC
  Administered 2011-09-12: 40 mg via ORAL
  Filled 2011-09-12: qty 1

## 2011-09-12 MED ORDER — HYDROMORPHONE HCL PF 1 MG/ML IJ SOLN
1.0000 mg | INTRAMUSCULAR | Status: DC | PRN
  Administered 2011-09-12 – 2011-09-13 (×3): 1 mg via INTRAVENOUS
  Filled 2011-09-12 (×3): qty 1

## 2011-09-12 MED ORDER — NITROGLYCERIN 0.4 MG SL SUBL: 0.4000 mg | SUBLINGUAL_TABLET | SUBLINGUAL | Status: DC | PRN

## 2011-09-12 MED ORDER — SODIUM CHLORIDE 0.9 % IJ SOLN: 3.0000 mL | INTRAMUSCULAR | Status: DC | PRN

## 2011-09-12 MED ORDER — SIMETHICONE 80 MG PO CHEW
160.0000 mg | CHEWABLE_TABLET | Freq: Four times a day (QID) | ORAL | Status: AC
  Administered 2011-09-12 (×3): 160 mg via ORAL
  Filled 2011-09-12 (×3): qty 2

## 2011-09-12 MED ORDER — GI COCKTAIL ~~LOC~~
30.0000 mL | Freq: Three times a day (TID) | ORAL | Status: DC | PRN
  Administered 2011-09-12: 30 mL via ORAL
  Filled 2011-09-12 (×2): qty 30

## 2011-09-12 MED ORDER — METHOTREXATE 2.5 MG PO TABS
15.0000 mg | ORAL_TABLET | ORAL | Status: DC
  Filled 2011-09-12: qty 6

## 2011-09-12 MED ORDER — SIMETHICONE 80 MG PO CHEW: 80.0000 mg | CHEWABLE_TABLET | Freq: Three times a day (TID) | ORAL | Status: AC | PRN

## 2011-09-12 MED ORDER — KETOROLAC TROMETHAMINE 30 MG/ML IJ SOLN
30.0000 mg | Freq: Once | INTRAMUSCULAR | Status: AC
  Administered 2011-09-12: 30 mg via INTRAVENOUS
  Filled 2011-09-12: qty 1

## 2011-09-12 MED ORDER — HYDROCODONE-ACETAMINOPHEN 5-325 MG PO TABS
1.0000 | ORAL_TABLET | ORAL | Status: DC | PRN
  Administered 2011-09-12 (×2): 2 via ORAL
  Filled 2011-09-12 (×2): qty 2

## 2011-09-12 NOTE — H&P (Signed)
PCP:  Unknown  Chief Complaint:  Chest pain  HPI: This is a 41 y/o gentleman who presents with complaints of left-sided chest pains that started at 5 PM this evening. He describes a heaviness in his chest which radiated down his left arm. He describes shortness of breath with the pain with exertion. He denies wheezing. He has a family history of coronary artery disease, his dad in his 84s and his brother in his 35s. The patient has hypertension which he states is often uncontrolled with blood pressures in the 180s. He also has obstructive sleep apnea with infrequent use of his CPAP machine. He had a negative left heart cath approximately 4 years ago. Chest pain described as sharp and 8/10.  The patient has had abdominal pain all week. He believes is gas pain. He's had bloating and ongoing discomfort. He states laxatives and something for heartburn without relief. Today he developed chest pains. The patient chest pain is reproducible - worse with movements. Reports no nausea no vomiting no diarrhea.  Review of Systems:   anorexia, fever, weight loss,, vision loss, decreased hearing, hoarseness, chest pain, syncope, dyspnea on exertion, peripheral edema, balance deficits, hemoptysis, abdominal pain, melena, hematochezia, severe indigestion/heartburn, hematuria, incontinence, genital sores, muscle weakness, suspicious skin lesions, transient blindness, difficulty walking, depression, unusual weight change, abnormal bleeding, enlarged lymph nodes, angioedema, and breast masses.  Past Medical History: Past Medical History  Diagnosis Date  . Hypertension   . Arthritis     type?  . Psoriasis     used to see derm   . Heart murmur congenital  . OSA on CPAP 2010  . Cardiomegaly 2010    [facility]  . Hyperlipidemia    Past Surgical History  Procedure Date  . Hernia repair     R inguinal  . Cardiac catheterization     Medications: Prior to Admission medications   Medication Sig Start  Date End Date Taking? Authorizing Provider  hydrochlorothiazide 25 MG tablet Take 1 tablet (25 mg total) by mouth daily. 01/12/11  Yes Wanda Plump, MD  lisinopril (PRINIVIL,ZESTRIL) 40 MG tablet Take 1 tablet (40 mg total) by mouth daily. 04/20/11  Yes Lewayne Bunting, MD  methotrexate (RHEUMATREX) 2.5 MG tablet Take 15 mg by mouth once a week. Caution:Chemotherapy. Protect from light.   (pt takes 6 tablets for total dose 15 MG  pt takes on fridays.   Yes Historical Provider, MD    Allergies:  No Known Allergies  Social History:  reports that he quit smoking about 10 years ago. He has never used smokeless tobacco. He reports that he drinks alcohol. He reports that he does not use illicit drugs.  Family History: Family History  Problem Relation Age of Onset  . Multiple sclerosis Mother   . Arthritis    . Heart disease      GM, father , brother (early age)  . Diabetes      father   . Colon cancer Neg Hx   . Prostate cancer Neg Hx     Physical Exam: Filed Vitals:   09/11/11 1026 09/11/11 1733 09/11/11 2035 09/11/11 2234  BP: 141/84 127/81 135/75 141/76  Pulse: 80 78 83 85  Temp: 98.1 F (36.7 C) 98.7 F (37.1 C) 98.2 F (36.8 C) 98.1 F (36.7 C)  TempSrc: Oral Oral Oral Oral  Resp: 16 18 18 18   Height: 5\' 5"  (1.651 m)     Weight: 87.091 kg (192 lb)     SpO2:  100% 100% 99% 98%    General:  Alert and oriented times three, well developed and nourished, no acute distress Eyes: PERRLA, pink conjunctiva, no scleral icterus ENT: Moist oral mucosa, neck supple, no thyromegaly Lungs: clear to ascultation, no wheeze, no crackles, no use of accessory muscles Cardiovascular: regular rate and rhythm, no regurgitation, no gallops, no murmurs. No carotid bruits, no JVD Abdomen: soft, positive BS, non-tender, bloated, not an acute abdomen GU: not examined Neuro: CN II - XII grossly intact, sensation intact Musculoskeletal: strength 5/5 all extremities, no clubbing, cyanosis or edema  reproducible chest wall pain Skin: no rash, no subcutaneous crepitation, no decubitus Psych: appropriate patient   Labs on Admission:   Basename 09/11/11 1105  NA 137  K 4.3  CL 100  CO2 26  GLUCOSE 92  BUN 10  CREATININE 1.01  CALCIUM 9.7  MG --  PHOS --   No results found for this basename: AST:2,ALT:2,ALKPHOS:2,BILITOT:2,PROT:2,ALBUMIN:2 in the last 72 hours No results found for this basename: LIPASE:2,AMYLASE:2 in the last 72 hours  Basename 09/11/11 1105  WBC 6.3  NEUTROABS --  HGB 16.9  HCT 48.7  MCV 86.0  PLT 259    Basename 09/11/11 1410 09/11/11 1105  CKTOTAL -- --  CKMB -- --  CKMBINDEX -- --  TROPONINI <0.30 <0.30   No components found with this basename: POCBNP:3 No results found for this basename: DDIMER:2 in the last 72 hours No results found for this basename: HGBA1C:2 in the last 72 hours No results found for this basename: CHOL:2,HDL:2,LDLCALC:2,TRIG:2,CHOLHDL:2,LDLDIRECT:2 in the last 72 hours No results found for this basename: TSH,T4TOTAL,FREET3,T3FREE,THYROIDAB in the last 72 hours No results found for this basename: VITAMINB12:2,FOLATE:2,FERRITIN:2,TIBC:2,IRON:2,RETICCTPCT:2 in the last 72 hours  Micro Results: No results found for this or any previous visit    Radiological Exams on Admission: Dg Chest 2 View  09/11/2011  *RADIOLOGY REPORT*  Clinical Data: 41 year old male with chest pain, hypertension.  CHEST - 2 VIEW  Comparison: 02/25/2009.  Findings: Stable lung volumes.  Cardiac size and mediastinal contours are within normal limits.  Visualized tracheal air column is within normal limits.  No pneumothorax, pulmonary edema, pleural effusion or confluent pulmonary opacity. No acute osseous abnormality identified.  IMPRESSION: No acute cardiopulmonary abnormality.  Original Report Authenticated By: Ulla Potash III, M.D.    EKG flipped T waves laterally leads. Normal sinus rhythm  Assessment/Plan Present on Admission:  Chest pains  Admit  to observation Patient with cardiac risk factors, however pain doesn't seem cardiac in etiology  We'll cycle cardiac enzymes.place patient on Protonix and Maalox, simethicone ordered Toradol for the most the skeletal component  Cardiology alert is seen the patient in the ER, they ordered an echo  Aspirin and nitroglycerin ordered .Hypertension .OSA on CPAP .Heart murmur .Psoriasis For medication resumed. CPAP   Full code DVT prophylaxis Team 2/Dr Enos Fling, Rebbecca Osuna 09/12/2011, 1:07 AM

## 2011-09-12 NOTE — Progress Notes (Signed)
UR completed 

## 2011-09-12 NOTE — Consult Note (Signed)
Referring Provider: [facility];Primary Care Physician:  Willow Ora, MD, MD Primary Gastroenterologist:  none  Reason for Consultation:   Chest pain, right upper abdominal pain and nausea  HPI: Jacob Cannon is a 41 y.o. male with history of hypertension, obstructive sleep apnea on CPAP, and history of psoriasis for which she takes methotrexate. He has had history of intermittent 8 atypical chest pain, and has had previous cardiac evaluations which have been negative. He was admitted yesterday here with recurrent chest pain and has been seen by cardiology and cleared. His cardiac enzymes are negative and his chest pain is felt to be noncardiac. Patient relates that over the past 2 weeks he has been having intermittent chest pain , and has also been having some intermittent right upper quadrant abdominal pain and a knot-like or" spasmy" type of sensation in his right upper quadrant. Over the past couple of days he says he has been having this cramping feeling all day long which does not change with by mouth intake. His pain is worse today and is not being relieved by oral pain medication. He has  also developed nausea but no vomiting. His bowels have been moving fairly regularly and has not noted any melena or hematochezia. Is not on any regular aspirin or NSAIDs. He does not feel that he is had any injury to cause musculoskeletal-type pain. Is very concerned because his pain seems to have gotten worse since admission.  Patient relates that he did have a prior upper endoscopy or colonoscopy done at [facility] perhaps 5-6 years ago for some intermittent bleeding. He does not think that anything significant was found. He says he cannot be seen by [facility] any longer due to to his insurance.   Past Medical History  Diagnosis Date  . Hypertension   . Arthritis     type?  . Psoriasis     used to see derm   . Heart murmur congenital  . OSA on CPAP 2010  . Cardiomegaly 2010    [facility]  .  Hyperlipidemia     Past Surgical History  Procedure Date  . Hernia repair     R inguinal  . Cardiac catheterization     Prior to Admission medications   Medication Sig Start Date End Date Taking? Authorizing Provider  hydrochlorothiazide 25 MG tablet Take 1 tablet (25 mg total) by mouth daily. 01/12/11  Yes Wanda Plump, MD  lisinopril (PRINIVIL,ZESTRIL) 40 MG tablet Take 1 tablet (40 mg total) by mouth daily. 04/20/11  Yes Lewayne Bunting, MD  methotrexate (RHEUMATREX) 2.5 MG tablet Take 15 mg by mouth once a week. Caution:Chemotherapy. Protect from light.   (pt takes 6 tablets for total dose 15 MG ) pt takes on fridays.   Yes Historical Provider, MD  HYDROcodone-acetaminophen (NORCO) 5-325 MG per tablet Take 1 tablet by mouth every 6 (six) hours as needed for pain. 09/12/11 09/22/11  Ripudeep Jenna Luo, MD  pantoprazole (PROTONIX) 40 MG tablet Take 1 tablet (40 mg total) by mouth daily. 09/12/11 09/11/12  Ripudeep Jenna Luo, MD  simethicone (MYLICON) 80 MG chewable tablet Chew 1 tablet (80 mg total) by mouth 3 (three) times daily as needed for flatulence (gas pains). 09/12/11 09/22/11  Ripudeep Jenna Luo, MD    Current Facility-Administered Medications  Medication Dose Route Frequency Provider Last Rate Last Dose  . enoxaparin (LOVENOX) injection 40 mg  40 mg Subcutaneous Q24H Debby Crosley, MD   40 mg at 09/12/11 0850  .  gi cocktail  30 mL Oral TID PRN Debby Crosley, MD      . hydrochlorothiazide (HYDRODIURIL) tablet 25 mg  25 mg Oral Daily Debby Crosley, MD   25 mg at 09/12/11 1019  . HYDROcodone-acetaminophen (NORCO) 5-325 MG per tablet 1-2 tablet  1-2 tablet Oral Q4H PRN Gery Pray, MD   2 tablet at 09/12/11 1306  . ketorolac (TORADOL) 30 MG/ML injection 30 mg  30 mg Intravenous Once Debby Crosley, MD   30 mg at 09/12/11 0121  . lisinopril (PRINIVIL,ZESTRIL) tablet 40 mg  40 mg Oral Daily Debby Crosley, MD   40 mg at 09/12/11 1019  . methotrexate (RHEUMATREX) tablet 15 mg  15 mg Oral Weekly Debby  Crosley, MD      . morphine 4 MG/ML injection 4 mg  4 mg Intravenous Once Jamesetta Orleans Lawyer, PA-C   4 mg at 09/11/11 1849  . morphine 4 MG/ML injection 4 mg  4 mg Intravenous Once Raeford Razor, MD   4 mg at 09/11/11 2217  . nitroGLYCERIN (NITROSTAT) SL tablet 0.4 mg  0.4 mg Sublingual Q5 Min x 3 PRN Debby Crosley, MD      . pantoprazole (PROTONIX) EC tablet 40 mg  40 mg Oral BID WC Ripudeep K Rai, MD      . simethicone (MYLICON) chewable tablet 160 mg  160 mg Oral QID Debby Crosley, MD   160 mg at 09/12/11 1404  . sodium chloride 0.9 % injection 3 mL  3 mL Intravenous PRN Debby Crosley, MD      . DISCONTD: pantoprazole (PROTONIX) EC tablet 40 mg  40 mg Oral Q0600 Debby Crosley, MD   40 mg at 09/12/11 0545    Allergies as of 09/11/2011  . (No Known Allergies)    Family History  Problem Relation Age of Onset  . Multiple sclerosis Mother   . Arthritis    . Heart disease      GM, father , brother (early age)  . Diabetes      father   . Colon cancer Neg Hx   . Prostate cancer Neg Hx     History   Social History  . Marital Status: Single    Spouse Name: N/A    Number of Children: 3  . Years of Education: N/A   Occupational History  .  [employer]   Social History Main Topics  . Smoking status: Former Smoker    Quit date: 04/19/2001  . Smokeless tobacco: Never Used  . Alcohol Use: Yes     wine- occasionally  . Drug Use: No  . Sexually Active: No   Other Topics Concern  . Not on file   Social History Narrative   3 biological kids and 5 adopted---    Review of Systems: Pertinent positive and negative review of systems were noted in the above HPI section.  All other review of systems was otherwise negative.    Physical Exam: Vital signs in last 24 hours: Temp:  [96.9 F (36.1 C)-98.7 F (37.1 C)] 97.5 F (36.4 C) (02/13 1318) Pulse Rate:  [69-85] 69  (02/13 1318) Resp:  [18] 18  (02/13 1318) BP: (123-141)/(73-85) 133/80 mmHg (02/13 1318) SpO2:  [95  %-100 %] 95 % (02/13 1318) Weight:  [202 lb 9.6 oz (91.9 kg)] 202 lb 9.6 oz (91.9 kg) (02/13 0231) Last BM Date: 09/11/11 General:   Alert,  Well-developed, well-nourished, pleasant and cooperative in NAD Head:  Normocephalic and atraumatic. Eyes:  Sclera clear, no  icterus.   Conjunctiva pink. Ears:  Normal auditory acuity. Nose:  No deformity, discharge,  or lesions. Mouth:  No deformity or lesions.   Neck:  Supple; no masses or thyromegaly. Lungs:  Clear throughout to auscultation.   No wheezes, crackles, or rhonchi. Heart:  Regular rate and rhythm; no murmurs, clicks, rubs,  or gallops. Abdomen:  Soft,tender RUQ, and mildly in epigastrium. He is also tender over right lower ant ribs,BS+ no mass or hsm Rectal ;not done Msk:  Symmetrical without gross deformities. . Pulses:  Normal pulses noted. Extremities:  Without clubbing or edema. Neurologic:  Alert and  oriented x4;  grossly normal neurologically. Skin:scattered psoriatic lesions Psych:  Alert and cooperative. Normal mood and affect.  Intake/Output from previous day: 02/12 0701 - 02/13 0700 In: 222 [P.O.:222] Out: -  Intake/Output this shift: Total I/O In: 480 [P.O.:480] Out: -   Lab Results:  Basename 09/11/11 1105  WBC 6.3  HGB 16.9  HCT 48.7  PLT 259   BMET  Basename 09/11/11 1105  NA 137  K 4.3  CL 100  CO2 26  GLUCOSE 92  BUN 10  CREATININE 1.01  CALCIUM 9.7   LFT No results found for this basename: PROT,ALBUMIN,AST,ALT,ALKPHOS,BILITOT,BILIDIR,IBILI in the last 72 hours PT/INR No results found for this basename: LABPROT:2,INR:2 in the last 72 hours Hepatitis Panel No results found for this basename: HEPBSAG,HCVAB,HEPAIGM,HEPBIGM in the last 72 hours    Studies/Results: Dg Chest 2 View  09/11/2011  *RADIOLOGY REPORT*  Clinical Data: 41 year old male with chest pain, hypertension.  CHEST - 2 VIEW  Comparison: 02/25/2009.  Findings: Stable lung volumes.  Cardiac size and mediastinal contours are  within normal limits.  Visualized tracheal air column is within normal limits.  No pneumothorax, pulmonary edema, pleural effusion or confluent pulmonary opacity. No acute osseous abnormality identified.  IMPRESSION: No acute cardiopulmonary abnormality.  Original Report Authenticated By: Harley Hallmark, M.D.    IMPRESSION:  #31 41 year old male with atypical chest pain and previous negative cardiac evaluation. Current pain felt to be noncardiac. #2 epigastric and right upper quadrant pain with nausea. Will need to rule out gallbladder disease, and peptic ulcer disease. He may have a component of musculoskeletal pain as well. #3 hypertension #4 psoriasis-on methotrexate over the past 4 or 5 months #5 history of obstructive sleep apnea uses CPAP  PLAN: #1 twice daily PPI therapy #2 hepatic panel and lipase this evening #3 we'll schedule for upper abdominal ultrasound in early a.m. #4 schedule for upper endoscopy tomorrow as well after ultrasound #5 pain control also add local heat. Further plans pending findings of above.   Vandana Haman  09/12/2011, 4:24 PM

## 2011-09-12 NOTE — ED Notes (Signed)
Pt in room resting with family at bedside awaiting MD to assess for admission. MD is aware.

## 2011-09-12 NOTE — Progress Notes (Signed)
*  PRELIMINARY RESULTS* Echocardiogram 2D Echocardiogram has been performed.  Jacob Cannon 09/12/2011, 11:47 AM

## 2011-09-12 NOTE — Progress Notes (Signed)
Jacob Cannon  ZOX:[id number]  DOB: 01/10/1971  DOA: 09/11/2011  PCP: Willow Ora, MD, MD  Subjective: Status chest pain somewhat improved however continues to have atypical right upper quadrant or epigastric pain  Objective: Weight change:   Intake/Output Summary (Last 24 hours) at 09/12/11 1746 Last data filed at 09/12/11 1700  Gross per 24 hour  Intake    942 ml  Output      0 ml  Net    942 ml   Blood pressure 133/80, pulse 69, temperature 97.5 F (36.4 C), temperature source Oral, resp. rate 18, height 5\' 5"  (1.651 m), weight 91.9 kg (202 lb 9.6 oz), SpO2 95.00%.  Physical Exam: General: Alert and awake, oriented x3, not in any acute distress. HEENT: anicteric sclera, pupils reactive to light and accommodation, EOMI CVS: S1-S2 clear, no murmur rubs or gallops Chest: clear to auscultation bilaterally, no wheezing, rales or rhonchi Abdomen: soft nontender, nondistended, normal bowel sounds, no organomegaly Extremities: no cyanosis, clubbing or edema noted bilaterally Neuro: Cranial nerves II-XII intact, no focal neurological deficits  Lab Results: Basic Metabolic Panel:  Lab 09/11/11 8119  NA 137  K 4.3  CL 100  CO2 26  GLUCOSE 92  BUN 10  CREATININE 1.01  CALCIUM 9.7  MG --  PHOS --   Liver Function Tests: No results found for this basename: AST:2,ALT:2,ALKPHOS:2,BILITOT:2,PROT:2,ALBUMIN:2 in the last 168 hours No results found for this basename: LIPASE:2,AMYLASE:2 in the last 168 hours No results found for this basename: AMMONIA:2 in the last 168 hours CBC:  Lab 09/11/11 1105  WBC 6.3  NEUTROABS --  HGB 16.9  HCT 48.7  MCV 86.0  PLT 259   Cardiac Enzymes:  Lab 09/12/11 0210 09/11/11 1410 09/11/11 1105  CKTOTAL -- -- --  CKMB -- -- --  CKMBINDEX -- -- --  TROPONINI <0.30 <0.30 <0.30   BNP: No components found with this basename: POCBNP:2 CBG: No results found for this basename: GLUCAP:5 in the last 168 hours   Micro Results: No results  found for this or any previous visit (from the past 240 hour(s)).  Studies/Results: Dg Chest 2 View  09/11/2011  *RADIOLOGY REPORT*  Clinical Data: 41 year old male with chest pain, hypertension.  CHEST - 2 VIEW  Comparison: 02/25/2009.  Findings: Stable lung volumes.  Cardiac size and mediastinal contours are within normal limits.  Visualized tracheal air column is within normal limits.  No pneumothorax, pulmonary edema, pleural effusion or confluent pulmonary opacity. No acute osseous abnormality identified.  IMPRESSION: No acute cardiopulmonary abnormality.  Original Report Authenticated By: Harley Hallmark, M.D.    Medications: Scheduled Meds:   . hydrochlorothiazide  25 mg Oral Daily  . ketorolac  30 mg Intravenous Once  . lisinopril  40 mg Oral Daily  . methotrexate  15 mg Oral Weekly  .  morphine injection  4 mg Intravenous Once  .  morphine injection  4 mg Intravenous Once  . pantoprazole  40 mg Oral BID WC  . simethicone  160 mg Oral QID  . DISCONTD: enoxaparin  40 mg Subcutaneous Q24H  . DISCONTD: pantoprazole  40 mg Oral Q0600   Continuous Infusions:    Assessment/Plan: Active Problems:  Hypertension: Stable  Psoriasis: Stable, continue methotrexate   OSA on CPAP  Chest pain: Atypical. Cardiac enzymes x3 negative - 2-D echocardiogram done today, EF preserved with no regional wall motion abnormalities    Abdominal pain, right upper quadrant/ epigastric and spasmodic - Added proton pump inhibitor, discussed with  gastroenterology - Right upper quadrant ultrasound and EGD tomorrow. Await LFTs and lipase ordered today  DVT Prophylaxis: SCDs  Code Status: Full code  Disposition: Hopefully DC home tomorrow if medically stable and workup negative   LOS: 1 day   Alisea Matte M.D. [facility] 09/12/2011, 5:46 PM

## 2011-09-12 NOTE — Consult Note (Signed)
I have taken a history, examined the patient and reviewed the chart. I agree with the extender's note, impression and recommendations.  Kymani Laursen T Lamarcus Spira MD FACG 

## 2011-09-12 NOTE — Progress Notes (Signed)
SUBJECTIVE:  Pain is improved   PHYSICAL EXAM Filed Vitals:   09/11/11 2234 09/12/11 0155 09/12/11 0231 09/12/11 0547  BP: 141/76 123/73 133/85 137/77  Pulse: 85 70 69 69  Temp: 98.1 F (36.7 C) 97.6 F (36.4 C) 97.2 F (36.2 C) 96.9 F (36.1 C)  TempSrc: Oral Oral Oral Axillary  Resp: 18 18 18 18   Height:   5\' 5"  (1.651 m)   Weight:   202 lb 9.6 oz (91.9 kg)   SpO2: 98% 99% 97% 100%   General:  No distress Lungs:  Clear Heart:  No rub, no murmur Abdomen:  Positive bowel sounds, no rebound no guarding. Extremities:  No distress  LABS: Lab Results  Component Value Date   TROPONINI <0.30 09/12/2011   Results for orders placed during the hospital encounter of 09/11/11 (from the past 24 hour(s))  CBC     Status: Normal   Collection Time   09/11/11 11:05 AM      Component Value Range   WBC 6.3  4.0 - 10.5 (K/uL)   RBC 5.66  4.22 - 5.81 (MIL/uL)   Hemoglobin 16.9  13.0 - 17.0 (g/dL)   HCT 00.1  74.9 - 44.9 (%)   MCV 86.0  78.0 - 100.0 (fL)   MCH 29.9  26.0 - 34.0 (pg)   MCHC 34.7  30.0 - 36.0 (g/dL)   RDW 67.5  91.6 - 38.4 (%)   Platelets 259  150 - 400 (K/uL)  BASIC METABOLIC PANEL     Status: Normal   Collection Time   09/11/11 11:05 AM      Component Value Range   Sodium 137  135 - 145 (mEq/L)   Potassium 4.3  3.5 - 5.1 (mEq/L)   Chloride 100  96 - 112 (mEq/L)   CO2 26  19 - 32 (mEq/L)   Glucose, Bld 92  70 - 99 (mg/dL)   BUN 10  6 - 23 (mg/dL)   Creatinine, Ser 6.65  0.50 - 1.35 (mg/dL)   Calcium 9.7  8.4 - 99.3 (mg/dL)   GFR calc non Af Amer >90  >90 (mL/min)   GFR calc Af Amer >90  >90 (mL/min)  TROPONIN I     Status: Normal   Collection Time   09/11/11 11:05 AM      Component Value Range   Troponin I <0.30  <0.30 (ng/mL)  URINALYSIS, ROUTINE W REFLEX MICROSCOPIC     Status: Normal   Collection Time   09/11/11 11:25 AM      Component Value Range   Color, Urine YELLOW  YELLOW    APPearance CLEAR  CLEAR    Specific Gravity, Urine 1.015  1.005 - 1.030     pH 7.5  5.0 - 8.0    Glucose, UA NEGATIVE  NEGATIVE (mg/dL)   Hgb urine dipstick NEGATIVE  NEGATIVE    Bilirubin Urine NEGATIVE  NEGATIVE    Ketones, ur NEGATIVE  NEGATIVE (mg/dL)   Protein, ur NEGATIVE  NEGATIVE (mg/dL)   Urobilinogen, UA 0.2  0.0 - 1.0 (mg/dL)   Nitrite NEGATIVE  NEGATIVE    Leukocytes, UA NEGATIVE  NEGATIVE   TROPONIN I     Status: Normal   Collection Time   09/11/11  2:10 PM      Component Value Range   Troponin I <0.30  <0.30 (ng/mL)  TROPONIN I     Status: Normal   Collection Time   09/12/11  2:10 AM  Component Value Range   Troponin I <0.30  <0.30 (ng/mL)  LIPID PANEL     Status: Abnormal   Collection Time   09/12/11  2:10 AM      Component Value Range   Cholesterol 212 (*) 0 - 200 (mg/dL)   Triglycerides 161  <096 (mg/dL)   HDL 42  >04 (mg/dL)   Total CHOL/HDL Ratio 5.0     VLDL 23  0 - 40 (mg/dL)   LDL Cholesterol 540 (*) 0 - 99 (mg/dL)   No intake or output data in the 24 hours ending 09/12/11 0635    ASSESSMENT AND PLAN:  1) Chest pain:  Trop negative x 3.  Echo pending.  Pain is atypical.  If echo is normal then no further inpatient evaluation from a cardiac standpoint would be necessary.  2)  Hypertension:  BP is improved on current meds.  Continue current therapy.   Rollene Rotunda 09/12/2011 6:35 AM

## 2011-09-12 NOTE — H&P (Signed)
error 

## 2011-09-13 ENCOUNTER — Encounter (HOSPITAL_COMMUNITY): Payer: Self-pay | Admitting: Gastroenterology

## 2011-09-13 ENCOUNTER — Other Ambulatory Visit: Payer: Self-pay | Admitting: Gastroenterology

## 2011-09-13 ENCOUNTER — Encounter (HOSPITAL_COMMUNITY)
Admission: EM | Disposition: A | Discharge status: Home / Self Care | Payer: Self-pay | Source: Home / Self Care | Attending: Emergency Medicine

## 2011-09-13 ENCOUNTER — Observation Stay (HOSPITAL_COMMUNITY): Payer: PRIVATE HEALTH INSURANCE

## 2011-09-13 DIAGNOSIS — K2970 Gastritis, unspecified, without bleeding: Secondary | ICD-10-CM

## 2011-09-13 DIAGNOSIS — K2990 Gastroduodenitis, unspecified, without bleeding: Secondary | ICD-10-CM

## 2011-09-13 HISTORY — PX: ESOPHAGOGASTRODUODENOSCOPY: SHX5428

## 2011-09-13 LAB — CBC
HCT: 48 % (ref 39.0–52.0)
MCV: 86 fL (ref 78.0–100.0)
Platelets: 242 10*3/uL (ref 150–400)
RBC: 5.58 MIL/uL (ref 4.22–5.81)
WBC: 7.4 10*3/uL (ref 4.0–10.5)

## 2011-09-13 SURGERY — EGD (ESOPHAGOGASTRODUODENOSCOPY)
Anesthesia: Moderate Sedation

## 2011-09-13 MED ORDER — PANTOPRAZOLE SODIUM 40 MG PO TBEC: 40.0000 mg | DELAYED_RELEASE_TABLET | Freq: Two times a day (BID) | ORAL | Status: DC

## 2011-09-13 MED ORDER — SODIUM CHLORIDE 0.45 % IV SOLN
Freq: Once | INTRAVENOUS | Status: AC
  Administered 2011-09-13: 12:00:00 via INTRAVENOUS

## 2011-09-13 MED ORDER — FENTANYL NICU IV SYRINGE 50 MCG/ML
INJECTION | INTRAMUSCULAR | Status: DC | PRN
  Administered 2011-09-13 (×2): 25 ug via INTRAVENOUS

## 2011-09-13 MED ORDER — METHYLPREDNISOLONE 4 MG PO KIT: PACK | ORAL | Status: AC

## 2011-09-13 MED ORDER — MIDAZOLAM HCL 10 MG/2ML IJ SOLN
INTRAMUSCULAR | Status: DC | PRN
  Administered 2011-09-13 (×2): 2 mg via INTRAVENOUS

## 2011-09-13 MED ORDER — BUTAMBEN-TETRACAINE-BENZOCAINE 2-2-14 % EX AERO
INHALATION_SPRAY | CUTANEOUS | Status: DC | PRN
  Administered 2011-09-13: 2 via TOPICAL

## 2011-09-13 NOTE — Discharge Summary (Signed)
Physician Discharge Summary  Patient ID: Jacob Cannon MRN: [id number] DOB/AGE: 1970-12-19 41 y.o.  Admit date: 09/11/2011 Discharge date: 09/13/2011  Primary Care Physician:  Jacob Ora, MD, MD  Discharge Diagnoses:   Present on Admission:  .Hypertension . atypical Chest pain: Gastritis with some musculoskeletal complement  .OSA on CPAP .Heart murmur .Psoriasis  gastritis /GERD   Consults:  Cardiology                     Gastroenterology, Dr. Krystal Cannon       Discharge Medications: Medication List  As of 09/13/2011  5:08 PM   TAKE these medications         hydrochlorothiazide 25 MG tablet   Commonly known as: HYDRODIURIL   Take 1 tablet (25 mg total) by mouth daily.      HYDROcodone-acetaminophen 5-325 MG per tablet   Commonly known as: NORCO   Take 1 tablet by mouth every 6 (six) hours as needed for pain.      lisinopril 40 MG tablet   Commonly known as: PRINIVIL,ZESTRIL   Take 1 tablet (40 mg total) by mouth daily.      methotrexate 2.5 MG tablet   Commonly known as: RHEUMATREX   Take 15 mg by mouth once a week. Caution:Chemotherapy. Protect from light.  (pt takes 6 tablets for total dose 15 MG )  pt takes on fridays.      methylPREDNISolone 4 MG tablet   Commonly known as: MEDROL DOSEPAK   follow package directions      pantoprazole 40 MG tablet   Commonly known as: PROTONIX   Take 1 tablet (40 mg total) by mouth 2 (two) times daily with a meal.      simethicone 80 MG chewable tablet   Commonly known as: MYLICON   Chew 1 tablet (80 mg total) by mouth 3 (three) times daily as needed for flatulence (gas pains).             Brief H and P: For complete details please refer to admission H and P, but in brief patient is a 41 year old male who presented with left-sided chest pain that started at 5 PM on the night of admission. He described it as heaviness in his chest which radiated down his left arm with some shortness of breath for the pain with exertion. He  also complained of abdominal pain and described it as 'gas pain ", with bloating and ongoing discomfort.     Hospital Course:  Patient was admitted for atypical chest pain, he was on cardiac telemetry monitor and was ruled out for any arrhythmias. Cardiac enzymes were done and were negative. Since patient had cardiac risk factors of family history of premature coronary artery disease and hypertension, cardiology service was consulted. 2-D echo was done, EF was 55-60% with normal wall motion and no regional wall motion abnormalities. As patient was having some abdominal issues as well, gastroenterology was consulted and patient was started on proton pump inhibitor. He underwent right quadrant ultrasound which was also essentially unremarkable except hepatic steatosis. EGD was done today and showed mild gastritis in the body and the antrum of the stomach. Patient was started on PPI twice a day and he will followup with Dr. Russella Cannon in the office in 2-3 weeks. He also had some musculoskeletal pain, possibly secondary to remote fall and was started on pain medications.    Day of Discharge BP 111/72  Pulse 86  Temp(Src) 97.8 F (36.6 C) (Oral)  Resp 18  Ht 5\' 5"  (1.651 m)  Wt 91.9 kg (202 lb 9.6 oz)  BMI 33.71 kg/m2  SpO2 92%  Physical Exam: General: Alert and awake oriented x3 not in any acute distress. HEENT: anicteric sclera, pupils reactive to light and accommodation CVS: S1-S2 clear no murmur rubs or gallops Chest: clear to auscultation bilaterally, no wheezing rales or rhonchi Abdomen: soft nontender, nondistended, normal bowel sounds, no organomegaly Extremities: no cyanosis, clubbing or edema noted bilaterally Neuro: Cranial nerves II-XII intact, no focal neurological deficits   The results of significant diagnostics from this hospitalization (including imaging, microbiology, ancillary and laboratory) are listed below for reference.    LAB RESULTS: Basic Metabolic Panel:  Lab 09/11/11  1105  NA 137  K 4.3  CL 100  CO2 26  GLUCOSE 92  BUN 10  CREATININE 1.01  CALCIUM 9.7  MG --  PHOS --   Liver Function Tests:  Lab 09/12/11 0210  AST 27  ALT 23  ALKPHOS 81  BILITOT 0.6  PROT 7.3  ALBUMIN 4.0    Lab 09/12/11 0210  LIPASE 41  AMYLASE --   CBC:  Lab 09/13/11 0456 09/11/11 1105  WBC 7.4 6.3  NEUTROABS -- --  HGB 16.5 16.9  HCT 48.0 48.7  MCV 86.0 --  PLT 242 259   Cardiac Enzymes:  Lab 09/12/11 0210 09/11/11 1410  CKTOTAL -- --  CKMB -- --  CKMBINDEX -- --  TROPONINI <0.30 <0.30    Significant Diagnostic Studies:  Dg Chest 2 View  09/11/2011  *RADIOLOGY REPORT*  Clinical Data: 41 year old male with chest pain, hypertension.  CHEST - 2 VIEW  Comparison: 02/25/2009.  Findings: Stable lung volumes.  Cardiac size and mediastinal contours are within normal limits.  Visualized tracheal air column is within normal limits.  No pneumothorax, pulmonary edema, pleural effusion or confluent pulmonary opacity. No acute osseous abnormality identified.  IMPRESSION: No acute cardiopulmonary abnormality.  Original Report Authenticated By: Jacob Cannon, M.D.     Disposition and Follow-up: Discharge Orders    Future Orders Please Complete By Expires   Diet - low sodium heart healthy      Increase activity slowly          DISPOSITION: Home  DIET:Heart healthy   ACTIVITY: As tolerated   DISCHARGE FOLLOW-UP Follow-up Information    Follow up with Jacob Ora, MD. Schedule an appointment as soon as possible for a visit in 10 days.      Follow up with Jacob Petit T. Jacob Dar, MD,FACG. Schedule an appointment as soon as possible for a visit in 3 weeks.   Contact information:   [address] [facility] [address] [place] [address] [phone]          Time spent on Discharge: 45 mins  Signed:  Halana Cannon M.D. [facility] 09/13/2011, 5:08 PM

## 2011-09-13 NOTE — Op Note (Signed)
[facility] [address] [place], [place]  [address]  ENDOSCOPY PROCEDURE REPORT  PATIENT:  Jacob Cannon, Jacob Cannon  MR#:  [id number] BIRTHDATE:  1971-04-03, 40 yrs. old  GENDER:  male ENDOSCOPIST:  Judie Petit T. Russella Dar, MD, [facility] Referred by:  [facility] PROCEDURE DATE:  09/13/2011 PROCEDURE:  EGD with biopsy, [id number] ASA CLASS:  Class II INDICATIONS:  chest pain, abdominal pain, right upper quad MEDICATIONS:   Fentanyl 75 mcg IV, Versed 6 mg IV TOPICAL ANESTHETIC:  Cetacaine Spray DESCRIPTION OF PROCEDURE:   After the risks benefits and alternatives of the procedure were thoroughly explained, informed consent was obtained.  The  endoscope was introduced through the mouth and advanced to the second portion of the duodenum, without limitations.  The instrument was slowly withdrawn as the mucosa was fully examined. <<PROCEDUREIMAGES>> Mild gastritis was found in the body and the antrum of the stomach. It was erythematous with a few erosions. Biopsies of the antrum and body of the stomach were obtained and sent to pathology.  Otherwise normal stomach.  The esophagus and gastroesophageal junction were completely normal in appearance. The duodenal bulb was normal in appearance, as was the postbulbar duodenum. Retroflexed views revealed no abnormalities. The scope was then withdrawn from the patient and the procedure completed.  COMPLICATIONS:  None  ENDOSCOPIC IMPRESSION: 1) Mild gastritis in the body and the antrum of the stomach  RECOMMENDATIONS: 1) Await pathology results 2) Avoid NSAIDs 3) PPI qam 4) Findings do not fully explain his chest pain/right costochodral pain which seem to be primarily musculoskeletal.  Judie Petit T. Russella Dar, MD, Clementeen Graham  n. eSIGNED:   Venita Lick. Brinklee Cisse at 09/13/2011 01:21 PM  Francis Dowse, [phone]

## 2011-09-13 NOTE — Interval H&P Note (Signed)
History and Physical Interval Note:  09/13/2011 1:05 PM  Jacob Cannon  has presented today for surgery, with the diagnosis of epigastric and ruq pain  The various methods of treatment have been discussed with the patient and family. After consideration of risks, benefits and other options for treatment, the patient has consented to  Procedure(s) (LRB): ESOPHAGOGASTRODUODENOSCOPY (EGD) (N/A) as a surgical intervention .  The patients' history has been reviewed, patient examined, no change in status, stable for surgery.  I have reviewed the patients' chart and labs.  Questions were answered to the patient's satisfaction.     Venita Lick. Benay Pike MD Clementeen Graham

## 2011-09-13 NOTE — Progress Notes (Signed)
I have taken an interval history, reviewed the chart and examined the patient. I agree with the extender's note, impression and recommendations.   Amorie Rentz T. Yasemin Rabon MD FACG 

## 2011-09-13 NOTE — Progress Notes (Signed)
Patient ID: Jacob Cannon, male   DOB: 1971-07-12, 41 y.o.   MRN: [id number] [facility] Progress Note  Subjective: Feels a little better today, less pain. He is still c/o some chest discomfort as well . No nausea, and hungry.  Objective:  Vital signs in last 24 hours: Temp:  [97.5 F (36.4 C)-98.1 F (36.7 C)] 98.1 F (36.7 C) (02/14 0525) Pulse Rate:  [62-69] 62  (02/14 0525) Resp:  [18-20] 18  (02/14 0525) BP: (120-133)/(75-84) 130/84 mmHg (02/14 1014) SpO2:  [95 %-99 %] 99 % (02/14 0525) Last BM Date: 09/11/11 General:   Alert,  Well-developed,    in NAD Heart:  Regular rate and rhythm; no murmurs Pulm;clear. he is tender to pressure over chest wall on left and sternum Abdomen:  Soft, mildly tender and nondistended. Normal bowel sounds, without guarding Extremities:  Without edema. Neurologic:  Alert and  oriented x4;  grossly normal neurologically. Psych:  Alert and cooperative. Normal mood and affect.  Intake/Output from previous day: 02/13 0701 - 02/14 0700 In: 1200 [P.O.:1200] Out: -  Intake/Output this shift:    Lab Results:  Basename 09/13/11 0456 09/11/11 1105  WBC 7.4 6.3  HGB 16.5 16.9  HCT 48.0 48.7  PLT 242 259   BMET  Basename 09/11/11 1105  NA 137  K 4.3  CL 100  CO2 26  GLUCOSE 92  BUN 10  CREATININE 1.01  CALCIUM 9.7   LFT  Basename 09/12/11 0210  PROT 7.3  ALBUMIN 4.0  AST 27  ALT 23  ALKPHOS 81  BILITOT 0.6  BILIDIR 0.1  IBILI 0.5   PT/INR No results found for this basename: LABPROT:2,INR:2 in the last 72 hours Hepatitis Panel No results found for this basename: HEPBSAG,HCVAB,HEPAIGM,HEPBIGM in the last 72 hours  Assessment / Plan: #1 41 yo male with epigastric/RUQ and chest pain.  Abdominal US this am shows no gallstones, he does have a fatty liver, pancreas incompletely visualized. I suspect his pain may be primarily musculoskeletal/ costochondritis.   EGD scheduled for today- if negative would continue PPI,  and give him a steroid dose pack for costochondritis. Active Problems:  Hypertension  Psoriasis  Heart murmur  OSA on CPAP  Chest pain  Abdominal pain, right upper quadrant     LOS: 2 days   Jacob Cannon  09/13/2011, 11:23 AM

## 2011-09-14 ENCOUNTER — Encounter: Payer: Self-pay | Admitting: Gastroenterology

## 2011-09-15 NOTE — ED Provider Notes (Signed)
Medical screening examination/treatment/procedure(s) were performed by non-physician practitioner and as supervising physician I was immediately available for consultation/collaboration.   Snow Peoples A. Medard Decuir, MD 09/15/11 0903 

## 2011-09-17 ENCOUNTER — Encounter (HOSPITAL_COMMUNITY): Payer: Self-pay | Admitting: Gastroenterology

## 2011-10-08 ENCOUNTER — Encounter: Payer: Self-pay | Admitting: Gastroenterology

## 2011-10-09 ENCOUNTER — Telehealth: Payer: Self-pay | Admitting: *Deleted

## 2011-10-09 NOTE — Telephone Encounter (Signed)
Appt scheduled

## 2011-10-09 NOTE — Telephone Encounter (Signed)
[facility] Triage Call Report Triage Record Num: [id number] Operator: Elita Boone Patient Name: Jacob Cannon Call Date & Time: 10/08/2011 4:59:47PM Patient Phone: [phone] PCP: Patient Gender: Male PCP Fax : Patient DOB: 16-Apr-1971 Practice Name: [facility] Reason for Call: Caller: [name]; PCP: Willow Ora; CB#: [phone]; Call regarding Post Hospital Followup. Wife wanted to make post-op appt for husband for 3/22. Wife insturcted to call office in am. Protocol(s) Used: Information Only Call; No Symptom Triage (Adult) Recommended Outcome per Protocol: Call Provider When Office is Open Reason for Outcome: Requesting regular office appointment Care Advice:

## 2011-10-10 ENCOUNTER — Encounter: Payer: Self-pay | Admitting: Cardiology

## 2011-10-10 ENCOUNTER — Ambulatory Visit (INDEPENDENT_AMBULATORY_CARE_PROVIDER_SITE_OTHER): Payer: PRIVATE HEALTH INSURANCE | Admitting: Cardiology

## 2011-10-10 DIAGNOSIS — R079 Chest pain, unspecified: Secondary | ICD-10-CM

## 2011-10-10 DIAGNOSIS — I10 Essential (primary) hypertension: Secondary | ICD-10-CM

## 2011-10-10 NOTE — Patient Instructions (Signed)
Your physician recommends that you schedule a follow-up appointment in: as needed  

## 2011-10-10 NOTE — Assessment & Plan Note (Signed)
Blood pressure controlled. Continue present medications. Management per primary care.

## 2011-10-10 NOTE — Progress Notes (Signed)
HPI: 41 yo male I saw in Sept of 2012 for evaluation of cardiac enlargement. Seen at [facility] in the past. Patient apparently has had intermittent chest pain for years. Review of his records from [facility] cardiology shows cardiac catheterization in 2004 revealed normal coronary arteries, an echocardiogram in April of 2012 that showed normal LV function and grade 2 diastolic dysfunction, renal Doppler in February of 2011 showed no stenosis. Myoview in October of 2010 showed abnormal ECG but perfusion was normal with an ejection fraction of 68%. Previous [facility] for pheochromocytoma negative. Admitted in February of 2013 with chest pain and enzymes negative. Echocardiogram in February 2013 showed an ejection fraction of 55-60% with no wall motion abnormalities. Right upper quadrant ultrasound unremarkable other than hepatic steatosis. Patient had EGD in February 2013 that showed mild gastritis. Findings not felt to be cause of chest pain; Pain felt possibly to be muskuloskeletal. Patient now returns for fu. Patient continues to have chest pain. It is in the left upper chest. It has been intermittent for 15 years. He has had extensive evaluation as outlined above. The pain can occur either with exertion or at rest. It lasts 15 minutes and resolves. No radiation. Some fatigue but no dyspnea.   Current Outpatient Prescriptions  Medication Sig Dispense Refill  . hydrochlorothiazide 25 MG tablet Take 1 tablet (25 mg total) by mouth daily.  30 tablet  5  . lisinopril (PRINIVIL,ZESTRIL) 40 MG tablet Take 1 tablet (40 mg total) by mouth daily.  30 tablet  12  . methotrexate (RHEUMATREX) 2.5 MG tablet Take 15 mg by mouth once a week. Caution:Chemotherapy. Protect from light.   (pt takes 6 tablets for total dose 15 MG )  pt takes on fridays.      . pantoprazole (PROTONIX) 40 MG tablet Take 1 tablet (40 mg total) by mouth 2 (two) times daily with a meal.  60 tablet  3     Past Medical History  Diagnosis Date  .  Hypertension   . Arthritis     type?  . Psoriasis     used to see derm   . Heart murmur congenital  . OSA on CPAP 2010  . Cardiomegaly 2010    [facility]  . Hyperlipidemia   . Chest pain Chronic    Past Surgical History  Procedure Date  . Hernia repair     R inguinal  . Cardiac catheterization   . Esophagogastroduodenoscopy 09/13/2011    Procedure: ESOPHAGOGASTRODUODENOSCOPY (EGD);  Surgeon: Eliezer Bottom., MD,FACG;  Location: [facility];  Service: Endoscopy;  Laterality: N/A;    History   Social History  . Marital Status: Single    Spouse Name: N/A    Number of Children: 3  . Years of Education: N/A   Occupational History  .  [employer]   Social History Main Topics  . Smoking status: Former Smoker    Quit date: 04/19/2001  . Smokeless tobacco: Never Used  . Alcohol Use: Yes     wine- occasionally  . Drug Use: No  . Sexually Active: No   Other Topics Concern  . Not on file   Social History Narrative   3 biological kids and 5 adopted---    ROS: no fevers or chills, productive cough, hemoptysis, dysphasia, odynophagia, melena, hematochezia, dysuria, hematuria, rash, seizure activity, orthopnea, PND, pedal edema, claudication. Remaining systems are negative.  Physical Exam: Well-developed well-nourished in no acute distress.  Skin is warm and dry.  HEENT  is normal.  Neck is supple. Chest is clear to auscultation with normal expansion. Some pain in left upper chest with palpation. Cardiovascular exam is regular rate and rhythm.  Abdominal exam nontender or distended. No masses palpated. Extremities show no edema. neuro grossly intact

## 2011-10-10 NOTE — Assessment & Plan Note (Signed)
Symptoms are chronic for approximately 15 years. He has had extensive cardiac evaluation for these symptoms previously including cardiac catheterization and stress test. Also echocardiogram. I do not think his symptoms are cardiac. I do not think further cardiac evaluation is indicated. He is scheduled to see GI back. He should also followup with his primary care.

## 2011-10-24 ENCOUNTER — Ambulatory Visit (INDEPENDENT_AMBULATORY_CARE_PROVIDER_SITE_OTHER): Payer: PRIVATE HEALTH INSURANCE | Admitting: Gastroenterology

## 2011-10-24 ENCOUNTER — Encounter: Payer: Self-pay | Admitting: Gastroenterology

## 2011-10-24 VITALS — BP 138/82 | HR 76 | Ht 65.0 in | Wt 201.0 lb

## 2011-10-24 DIAGNOSIS — K2970 Gastritis, unspecified, without bleeding: Secondary | ICD-10-CM

## 2011-10-24 DIAGNOSIS — R079 Chest pain, unspecified: Secondary | ICD-10-CM

## 2011-10-24 DIAGNOSIS — K2990 Gastroduodenitis, unspecified, without bleeding: Secondary | ICD-10-CM

## 2011-10-24 MED ORDER — PANTOPRAZOLE SODIUM 40 MG PO TBEC: 40.0000 mg | DELAYED_RELEASE_TABLET | Freq: Every day | ORAL | Status: DC

## 2011-10-24 MED ORDER — HYOSCYAMINE SULFATE 0.125 MG SL SUBL: SUBLINGUAL_TABLET | SUBLINGUAL | Status: DC

## 2011-10-24 NOTE — Patient Instructions (Addendum)
We have sent the following medications to your pharmacy for you to pick up at your convenience:Levsin. Decrease taking your pantoprazole to once daily.

## 2011-10-24 NOTE — Progress Notes (Signed)
History of Present Illness: This is a 41 year old male complaining of clearing his throat with clear drainage in his throat and mouth and gagging mainly in the morning and sometimes at night. He was recenty seen as a hospital consultation for chest pain and right costal margin pain which have improved but persist. Abdominal ultrasound and upper endoscopy were only remarkable for mild gastritis. He complains of spasms and cramps in his abdomen that are partially relieved simethicone. His chest pain and right costal margin pain were not felt to be GI related. He was treated with pantoprazole for his gastritis. Denies weight loss, constipation, diarrhea, change in stool caliber, melena, hematochezia, nausea, vomiting, dysphagia, reflux symptoms, chest pain.  Current Medications, Allergies, Past Medical History, Past Surgical History, Family History and Social History were reviewed in [employer] record.  Physical Exam: General: Well developed , well nourished, no acute distress Head: Normocephalic and atraumatic Eyes:  sclerae anicteric, EOMI Ears: Normal auditory acuity Mouth: No deformity or lesions Lungs: Clear throughout to auscultation Heart: Regular rate and rhythm; no murmurs, rubs or bruits Abdomen: Soft, non tender and non distended. No masses, hepatosplenomegaly or hernias noted. Normal Bowel sounds Musculoskeletal: Symmetrical with no gross deformities  Pulses:  Normal pulses noted Extremities: No clubbing, cyanosis, edema or deformities noted Neurological: Alert oriented x 4, grossly nonfocal Psychological:  Alert and cooperative. Normal mood and affect  Assessment and Recommendations:  1. Chest pain and right costal margin pain. Not gastrointestinal in etiology. Possibly musculoskeletal. Further followup with his PCP.  2. Gastritis. Continue pantoprazole daily.  3. Throat clearing, drainage and gagging do not appear to be GI related. Possibly sinus drainage  or related to OSA or CPAP. Follow up with PCP.  4. Mild intermittent abdominal cramping. Continue simethicone prn and add Levsin prn.

## 2011-10-25 ENCOUNTER — Ambulatory Visit (INDEPENDENT_AMBULATORY_CARE_PROVIDER_SITE_OTHER): Payer: PRIVATE HEALTH INSURANCE | Admitting: Internal Medicine

## 2011-10-25 VITALS — BP 138/84 | HR 76 | Temp 98.2°F | Wt 207.0 lb

## 2011-10-25 DIAGNOSIS — K2990 Gastroduodenitis, unspecified, without bleeding: Secondary | ICD-10-CM

## 2011-10-25 DIAGNOSIS — K2970 Gastritis, unspecified, without bleeding: Secondary | ICD-10-CM

## 2011-10-25 DIAGNOSIS — R059 Cough, unspecified: Secondary | ICD-10-CM

## 2011-10-25 DIAGNOSIS — L408 Other psoriasis: Secondary | ICD-10-CM

## 2011-10-25 DIAGNOSIS — L409 Psoriasis, unspecified: Secondary | ICD-10-CM

## 2011-10-25 DIAGNOSIS — R079 Chest pain, unspecified: Secondary | ICD-10-CM

## 2011-10-25 DIAGNOSIS — I10 Essential (primary) hypertension: Secondary | ICD-10-CM

## 2011-10-25 DIAGNOSIS — R05 Cough: Secondary | ICD-10-CM

## 2011-10-25 NOTE — Assessment & Plan Note (Signed)
Per dermatology, patient will call with the dermatology name, she needs a copy of this office visit.

## 2011-10-25 NOTE — Progress Notes (Signed)
  Subjective:    Patient ID: Jacob Cannon, male    DOB: [birth year], 41 y.o.   MRN: [id number]  HPI Followup hospital admission Since last time I saw him, he has been seen by a number of providers, extensive chart review. Was admitted 08-2011 with chest pain, cardiology was consulted, echocardiogram was normal. It was felt that he had a muscle skeletal pain. This was followed by an EGD, it showed gastritis, was prescribed PPIs. GI also agreed that part of the pain was musculoskeletal not just gastritis. Also complained of a sinus problem for the last 2 weeks: Eyes and nose runny, sinus disease congested.  Past medical history Hypertension Psoriasis, followup by dermatology OSA, on CPAP Arthritis, type? Cardiovascular (used to see [facility] now [facility]) --Reports a history of heart murmur, ? Cardiomegaly. --Chronic chest pain, previous extensive workup negative, admitted 08-2011, echo essentially normal. Gastritis by EGD 08/2011  Past Surgical History  Procedure Date  . Hernia repair     R inguinal  . Cardiac catheterization   . Esophagogastroduodenoscopy 09/13/2011    Procedure: ESOPHAGOGASTRODUODENOSCOPY (EGD);  Surgeon: Eliezer Bottom., MD,FACG;  Location: [facility];  Service: Endoscopy;  Laterality: N/A;    Review of Systems Overall he is better. Musculoskeletal chest pain almost resolved. He admits that had heartburn before, but w/ PPIs the heartburn definitely decreased. Denies dysphasia or odynophagia. No fever or chills. He admits to cough with some greenish sputum. No chest congestion.     Objective:   Physical Exam General -- alert, well-developed, and well-nourished. NAD  Neck --no LADs HEENT -- TMs normal, throat w/o redness, face symmetric and not tender to palpation, nose clear Lungs -- normal respiratory effort, no intercostal retractions, no accessory muscle use, and normal breath sounds.   Heart-- normal rate, regular rhythm, no murmur, and no gallop.     Extremities-- no pretibial edema bilaterally         Assessment & Plan:

## 2011-10-25 NOTE — Assessment & Plan Note (Signed)
Patient recently diagnosed with gastritis by EGD, and now on PPIs. He still has some breakthrough heartburn but on looking back, the heartburn has decrease tremendously. Plan: Continue with PPIs Heartburn prevention info provided

## 2011-10-25 NOTE — Assessment & Plan Note (Signed)
He respiratory symptoms, allergies versus URI. See instructions

## 2011-10-25 NOTE — Assessment & Plan Note (Signed)
Acceptable control. No change.

## 2011-10-25 NOTE — Patient Instructions (Addendum)
Heartburn Heartburn is a painful, burning sensation in the chest. It may feel worse in certain positions, such as lying down or bending over. It is caused by stomach acid backing up into the tube that carries food from the mouth down to the stomach (lower esophagus).  CAUSES   Large meals.   Certain foods and drinks.   Exercise.   Increased acid production.   Being overweight or obese.   Certain medicines.  SYMPTOMS   Burning pain in the chest or lower throat.   Bitter taste in the mouth.   Coughing.  DIAGNOSIS  If the usual treatments for heartburn do not improve your symptoms, then tests may be done to see if there is another condition present. Possible tests may include:  X-rays.   Endoscopy. This is when a tube with a light and a camera on the end is used to examine the esophagus and the stomach.   A test to measure the amount of acid in the esophagus (pH test).   A test to see if the esophagus is working properly (esophageal manometry).   Blood, breath, or stool tests to check for bacteria that cause ulcers.  TREATMENT   Your caregiver may tell you to use certain over-the-counter medicines (antacids, acid reducers) for mild heartburn.   Your caregiver may prescribe medicines to decrease the acid in your stomach or protect your stomach lining.   Your caregiver may recommend certain diet changes.   For severe cases, your caregiver may recommend that the head of your bed be elevated on blocks. (Sleeping with more pillows is not an effective treatment as it only changes the position of your head and does not improve the main problem of stomach acid refluxing into the esophagus.)  HOME CARE INSTRUCTIONS   Take all medicines as directed by your caregiver.   Raise the head of your bed by putting blocks under the legs if instructed to by your caregiver.   Do not exercise right after eating.   Avoid eating 2 or 3 hours before bed. Do not lie down right after eating.    Eat small meals throughout the day instead of 3 large meals.   Stop smoking if you smoke.   Maintain a healthy weight.   Identify foods and beverages that make your symptoms worse and avoid them. Foods you may want to avoid include:   Peppers.   Chocolate.   High-fat foods, including fried foods.   Spicy foods.   Garlic and onions.   Citrus fruits, including oranges, grapefruit, lemons, and limes.   Food containing tomatoes or tomato products.   Mint.   Carbonated drinks, caffeinated drinks, and alcohol.   Vinegar.  SEEK IMMEDIATE MEDICAL CARE IF:  You have severe chest pain that goes down your arm or into your jaw or neck.   You feel sweaty, dizzy, or lightheaded.   You are short of breath.   You vomit blood.   You have difficulty or pain with swallowing.   You have bloody or black, tarry stools.   You have episodes of heartburn more than 3 times a week for more than 2 weeks.  MAKE SURE YOU:  Understand these instructions.   Will watch your condition.   Will get help right away if you are not doing well or get worse.  Document Released: 12/02/2008 Document Revised: 07/05/2011 Document Reviewed: 12/31/2010 [facility] Patient Information 2012 [place], [place].  For the cough and sinus congestion: Claritin 10 mg OTC, 1 tablet daily  until better. Mucinex DM, 1 tablet twice a day for a few days. Call if symptoms persist or if they are severe.

## 2011-10-25 NOTE — Assessment & Plan Note (Signed)
Chronic chest pain, extensive workup in the last few years, most recently admitted 08-2011, cardiology was consulted, echocardiogram normal. Pain believed to be noncardiac and is now resolved

## 2011-10-27 ENCOUNTER — Encounter: Payer: Self-pay | Admitting: Internal Medicine

## 2011-11-16 IMAGING — CT CT HEAD W/O CM
1 series · 16 of 30 positions shown, 20 images · non-contrast
Comparison: Head CT 03/31/2010

CLINICAL DATA: Laceration, blunt trauma

CT HEAD WITHOUT CONTRAST
TECHNIQUE: Contiguous axial images were obtained from the base of
the skull through the vertex without contrast.

[Series 2: head 4.8 h37s · axial · 0.44mm/px · z∈[-138,-5]mm · 16 of 32 slices shown, 20 images]
[im 2/32  brain]
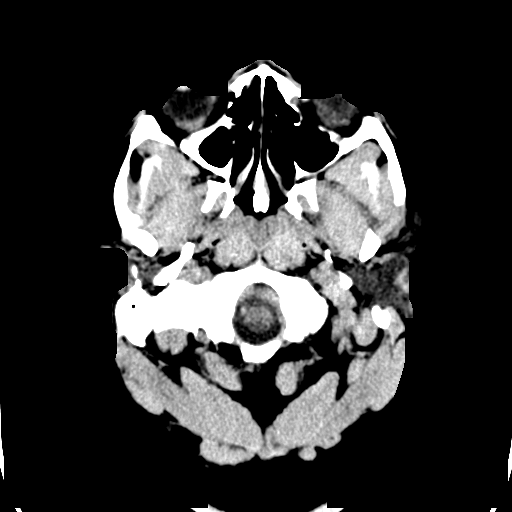
[im 2/32  bone]
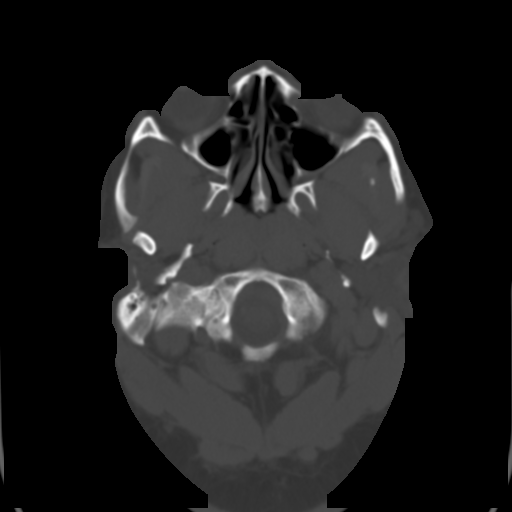
[im 4/32  brain]
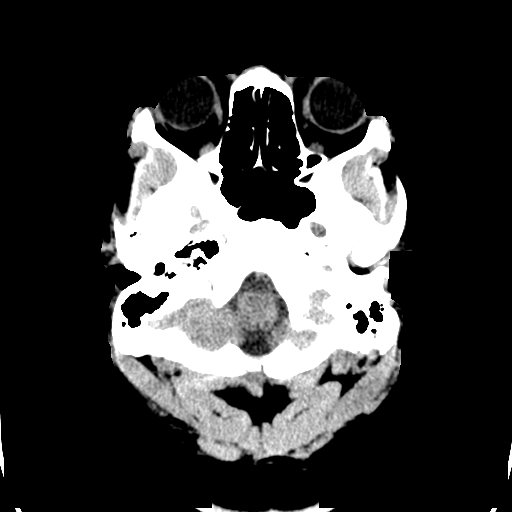
[im 6/32  brain]
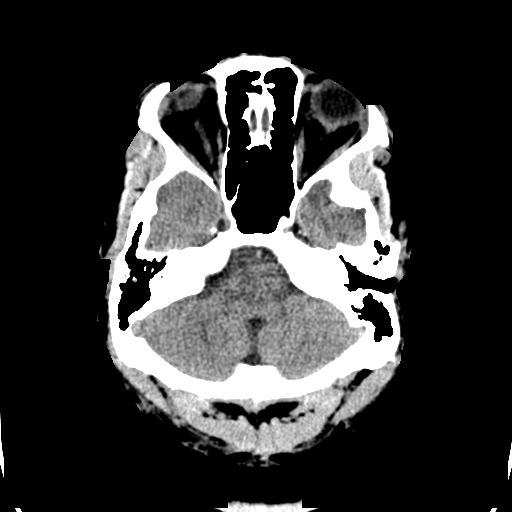
[im 8/32  brain]
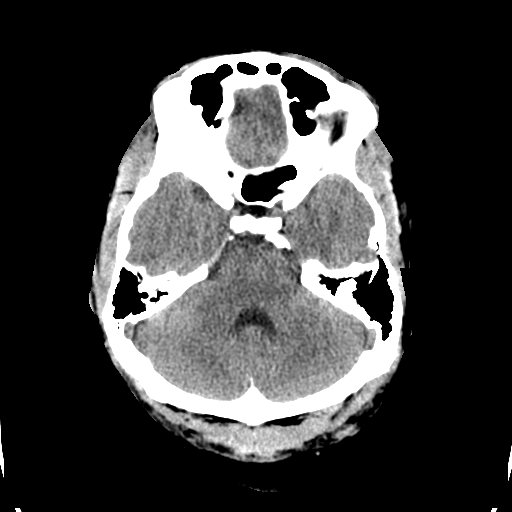
[im 9/32  brain]
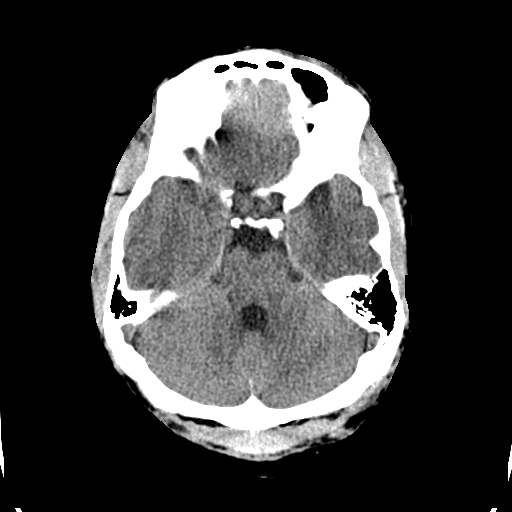
[im 9/32  bone]
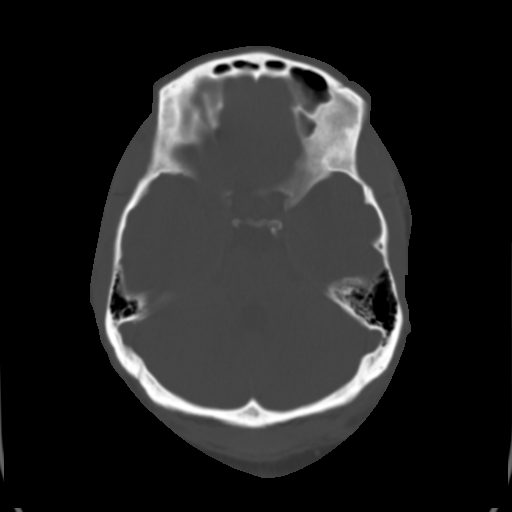
[im 11/32  brain]
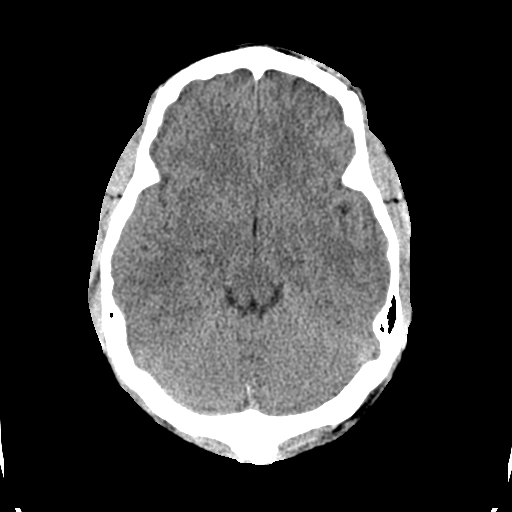
[im 13/32  brain]
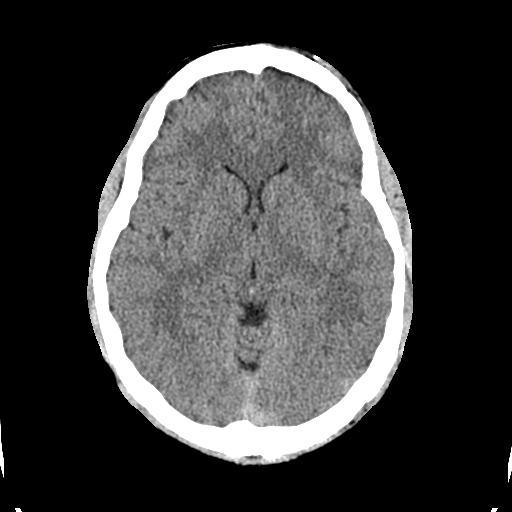
[im 15/32  brain]
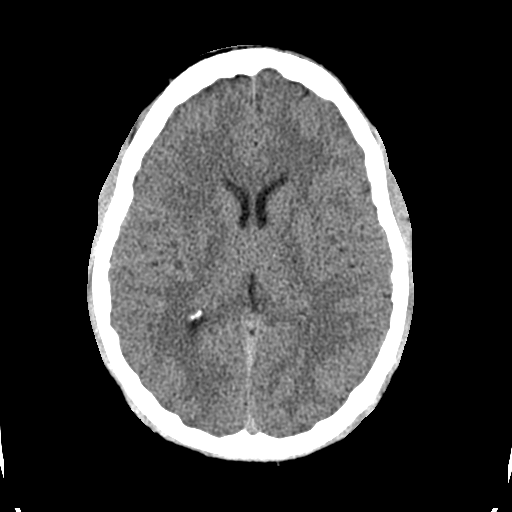
[im 17/32  brain]
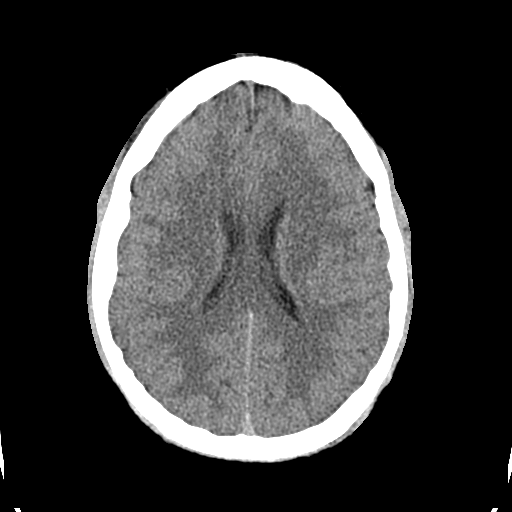
[im 17/32  bone]
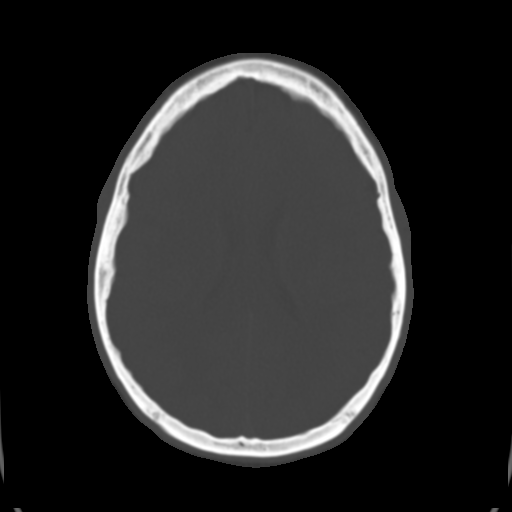
[im 19/32  brain]
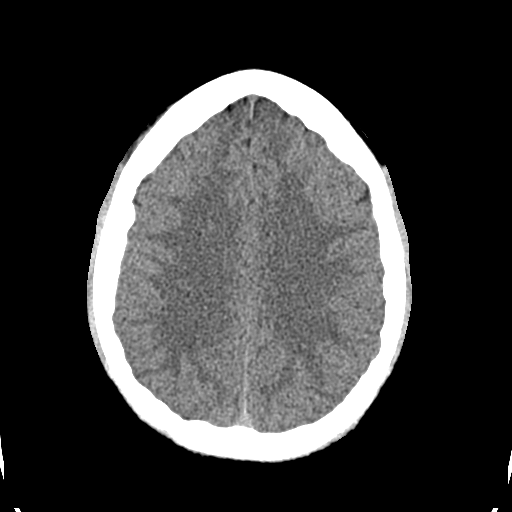
[im 21/32  brain]
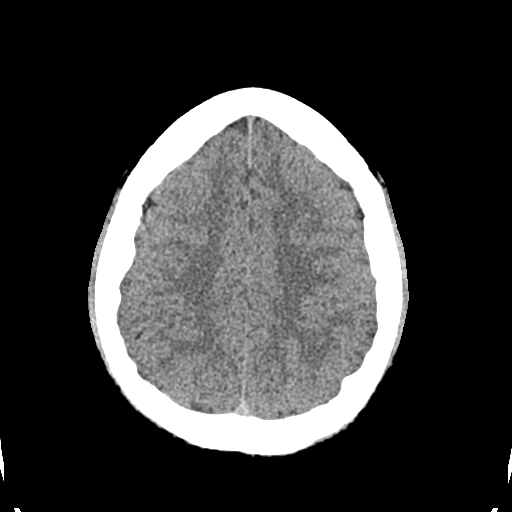
[im 23/32  brain]
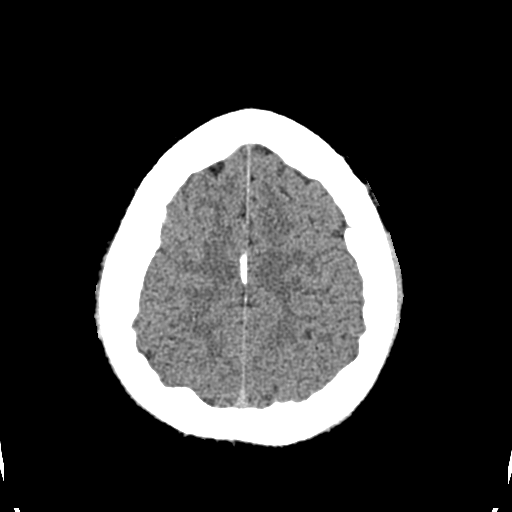
[im 24/32  brain]
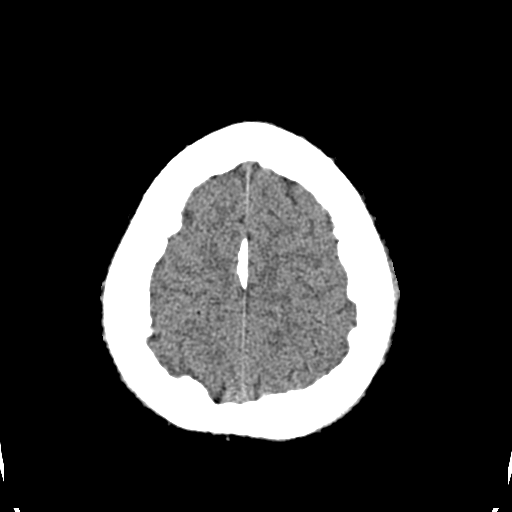
[im 24/32  bone]
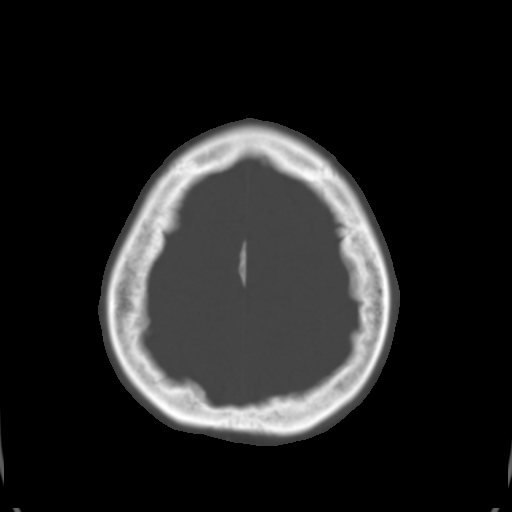
[im 26/32  brain]
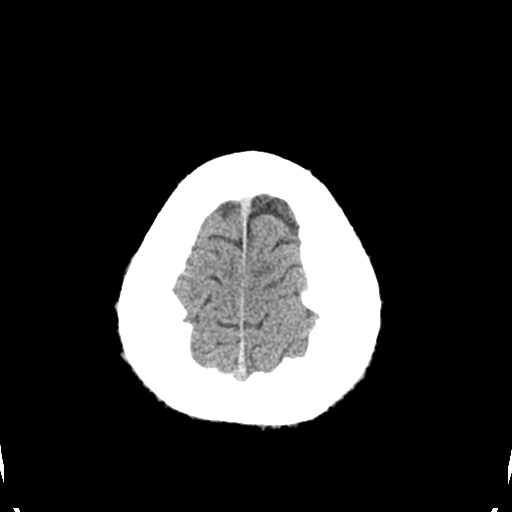
[im 28/32  brain]
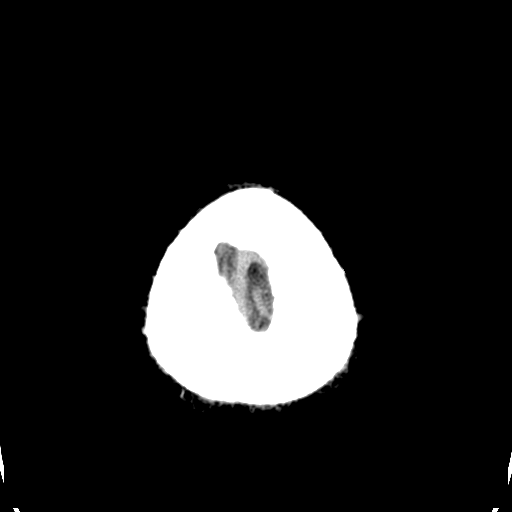
[im 30/32  brain]
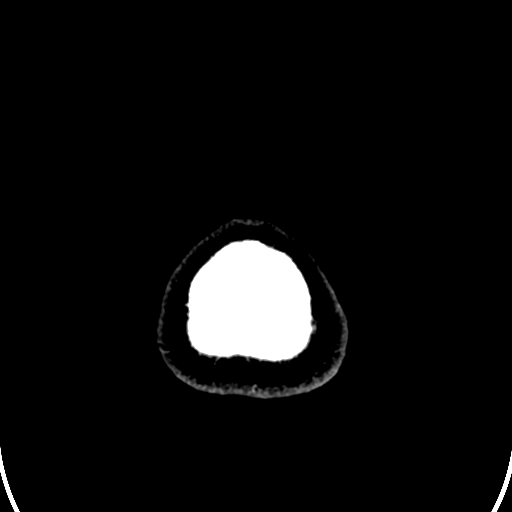

[16 of 30 positions shown; findings below may reference images not displayed]

FINDINGS: No intracranial hemorrhage.  No parenchymal contusion.
No midline shift or mass effect.  Basilar cisterns are patent.  No
skull base fracture.  No fluid in the paranasal sinuses.  Orbits
are normal.

There is a small laceration over the anterior left frontal bone.
IMPRESSION: No intracranial trauma.

## 2012-04-27 ENCOUNTER — Other Ambulatory Visit: Payer: Self-pay | Admitting: Cardiology

## 2012-06-22 ENCOUNTER — Ambulatory Visit (INDEPENDENT_AMBULATORY_CARE_PROVIDER_SITE_OTHER): Payer: PRIVATE HEALTH INSURANCE | Admitting: Family Medicine

## 2012-06-22 VITALS — BP 150/90 | HR 75 | Temp 97.8°F | Resp 16 | Ht 65.0 in | Wt 203.0 lb

## 2012-06-22 DIAGNOSIS — J40 Bronchitis, not specified as acute or chronic: Secondary | ICD-10-CM

## 2012-06-22 MED ORDER — AZITHROMYCIN 250 MG PO TABS: ORAL_TABLET | ORAL | Status: DC

## 2012-06-22 MED ORDER — GUAIFENESIN-CODEINE 100-10 MG/5ML PO SYRP: 5.0000 mL | ORAL_SOLUTION | Freq: Three times a day (TID) | ORAL | Status: DC | PRN

## 2012-06-22 NOTE — Progress Notes (Signed)
Subjective:    Patient ID: Jacob Cannon, male    DOB: 11/29/1970, 41 y.o.   MRN: [id number] Chief Complaint  Patient presents with  . Cough    5 days  . Sore Throat    5 days    HPI  Feels like he has strep throat, a cold, or a flu.  Sxs have been going on for a wk - took off of since Tuesday (5d ago). Cough productive, worse at night.  Some chills, no fever.  Past Medical History  Diagnosis Date  . Hypertension   . Arthritis     type?  . Psoriasis     used to see derm   . Heart murmur congenital  . OSA on CPAP 2010  . Cardiomegaly 2010    [facility]  . Hyperlipidemia   . Chest pain Chronic  . Hepatic steatosis   . GERD (gastroesophageal reflux disease)    Current Outpatient Prescriptions on File Prior to Visit  Medication Sig Dispense Refill  . hydrochlorothiazide 25 MG tablet Take 1 tablet (25 mg total) by mouth daily.  30 tablet  5  . hyoscyamine (LEVSIN/SL) 0.125 MG SL tablet 1-2 tablets by mouth every 4 hours as needed for abdominal pain  60 tablet  11  . lisinopril (PRINIVIL,ZESTRIL) 40 MG tablet TAKE 1 TABLET BY MOUTH EVERY DAY  30 tablet  3  . methotrexate (RHEUMATREX) 2.5 MG tablet Take 15 mg by mouth once a week. Caution:Chemotherapy. Protect from light.   (pt takes 6 tablets for total dose 15 MG )  pt takes on fridays.      . pantoprazole (PROTONIX) 40 MG tablet Take 1 tablet (40 mg total) by mouth daily.  60 tablet  3   No current facility-administered medications on file prior to visit.   No Known Allergies   Review of Systems  Constitutional: Positive for chills, diaphoresis, appetite change and fatigue. Negative for fever, activity change and unexpected weight change.  HENT: Positive for ear pain, congestion, sore throat, rhinorrhea, postnasal drip and sinus pressure. Negative for sneezing and neck stiffness.   Eyes: Positive for discharge. Negative for pain and itching.  Respiratory: Positive for cough, shortness of breath and wheezing.     Cardiovascular: Positive for chest pain.  Gastrointestinal: Positive for nausea. Negative for vomiting, abdominal pain, diarrhea and constipation.  Genitourinary: Negative for dysuria.  Musculoskeletal: Positive for myalgias, back pain and arthralgias. Negative for joint swelling and gait problem.  Skin: Positive for rash.  Neurological: Positive for headaches. Negative for syncope.  Hematological: Positive for adenopathy.  Psychiatric/Behavioral: Positive for sleep disturbance.       BP 150/90  Pulse 75  Temp(Src) 97.8 F (36.6 C) (Oral)  Resp 16  Ht 5\' 5"  (1.651 m)  Wt 203 lb (92.08 kg)  BMI 33.78 kg/m2  SpO2 100% Objective:   Physical Exam  Constitutional: He is oriented to person, place, and time. He appears well-developed and well-nourished. No distress.  HENT:  Head: Normocephalic and atraumatic.  Right Ear: External ear and ear canal normal. Tympanic membrane is retracted.  Left Ear: External ear and ear canal normal. Tympanic membrane is retracted.  Nose: Mucosal edema and rhinorrhea present.  Mouth/Throat: Mucous membranes are normal. Posterior oropharyngeal erythema present. No oropharyngeal exudate or posterior oropharyngeal edema.  Eyes: Conjunctivae are normal. No scleral icterus.  Neck: Normal range of motion. Neck supple. No thyromegaly present.  Cardiovascular: Normal rate, regular rhythm, normal heart sounds and intact distal pulses.  Pulmonary/Chest: Effort normal and breath sounds normal. No respiratory distress.  Musculoskeletal: He exhibits no edema.  Lymphadenopathy:    He has no cervical adenopathy.  Neurological: He is alert and oriented to person, place, and time.  Skin: Skin is warm and dry. He is not diaphoretic. No erythema.  Psychiatric: He has a normal mood and affect. His behavior is normal.      Assessment & Plan:  Bronchitis - Plan: azithromycin (ZITHROMAX) 250 MG tablet, guaiFENesin-codeine (ROBITUSSIN AC) 100-10 MG/5ML syrup  Meds  ordered this encounter  Medications  . azithromycin (ZITHROMAX) 250 MG tablet    Sig: Take 2 tabs PO x 1 dose, then 1 tab PO QD x 4 days    Dispense:  6 tablet    Refill:  0  . guaiFENesin-codeine (ROBITUSSIN AC) 100-10 MG/5ML syrup    Sig: Take 5 mLs by mouth 3 (three) times daily as needed for cough.    Dispense:  240 mL    Refill:  0

## 2013-05-02 ENCOUNTER — Other Ambulatory Visit: Payer: Self-pay | Admitting: Cardiology

## 2013-07-02 ENCOUNTER — Other Ambulatory Visit: Payer: Self-pay | Admitting: Cardiology

## 2013-08-29 ENCOUNTER — Other Ambulatory Visit: Payer: Self-pay | Admitting: Cardiology

## 2013-10-30 ENCOUNTER — Ambulatory Visit (INDEPENDENT_AMBULATORY_CARE_PROVIDER_SITE_OTHER): Payer: PRIVATE HEALTH INSURANCE | Admitting: Internal Medicine

## 2013-10-30 VITALS — BP 120/78 | HR 86 | Temp 98.4°F | Resp 16 | Ht 66.0 in | Wt 206.0 lb

## 2013-10-30 DIAGNOSIS — N500 Atrophy of testis: Secondary | ICD-10-CM | POA: Insufficient documentation

## 2013-10-30 DIAGNOSIS — N50819 Testicular pain, unspecified: Secondary | ICD-10-CM

## 2013-10-30 DIAGNOSIS — N509 Disorder of male genital organs, unspecified: Secondary | ICD-10-CM

## 2013-10-30 LAB — POCT UA - MICROSCOPIC ONLY
Bacteria, U Microscopic: 0
Casts, Ur, LPF, POC: NEGATIVE
Crystals, Ur, HPF, POC: NEGATIVE
Mucus, UA: POSITIVE
RBC, URINE, MICROSCOPIC: 0
Yeast, UA: NEGATIVE

## 2013-10-30 LAB — POCT URINALYSIS DIPSTICK
Bilirubin, UA: NEGATIVE
Blood, UA: NEGATIVE
GLUCOSE UA: NEGATIVE
Ketones, UA: NEGATIVE
Leukocytes, UA: NEGATIVE
NITRITE UA: NEGATIVE
Protein, UA: 100
Spec Grav, UA: 1.025
UROBILINOGEN UA: 1
pH, UA: 6

## 2013-10-30 MED ORDER — MELOXICAM 15 MG PO TABS: 15.0000 mg | ORAL_TABLET | Freq: Every day | ORAL | Status: DC

## 2013-10-30 NOTE — Progress Notes (Addendum)
Subjective:    Patient ID: Jacob Cannon, male    DOB: May 06, 1971, 43 y.o.   MRN: [id number] This chart was scribed for Tami Lin, MD by Vernell Barrier, Medical Scribe. This patient's care was started at 4:31 PM.  Testicle Pain The patient's primary symptoms include testicular pain. The patient's pertinent negatives include no penile discharge or scrotal swelling. Pertinent negatives include no abdominal pain, chest pain, constipation, diarrhea, dysuria, fever, frequency, nausea, urgency or vomiting.   HPI Comments: Jacob Cannon is a 43 y.o. male who presents to the [facility] complaining of with testicular pain and frequency, onset 1 month ago. Pain worse with walking. States it began with a back ache and has since moved into the testicles. Also reports one testicle is slightly larger than the other. States he was able to differentiate the testicular pain from the back pain due to having to get up and use the bathroom so often. States he lifts weights regularly. Denies any new sexual partners. Denies testicular swelling, dysuria, penile discharge. Married with no other partners or risk for sexually transmitted infections. He does have pain in his right testicle with intercourse.  Patient Active Problem List   Diagnosis Date Noted  . 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 Unspecified gastritis and gastroduodenitis without mention of hemorrhage 09/13/2011  . Abdominal pain, right upper quadrant 09/12/2011  . Chest pain 04/20/2011  . Cough 01/15/2011  . General medical examination 01/12/2011  . Hypertension   . Psoriasis   . Heart murmur   . OSA on CPAP   . Cardiomegaly    Current outpatient prescriptions:lisinopril (PRINIVIL,ZESTRIL) 40 MG tablet, TAKE 1 TABLET BY MOUTH DAILY, Disp: 30 tablet, Rfl: 1;hydrochlorothiazide 25 MG tablet, Take 1 tablet (25 mg total) by mouth daily., Disp: 30 tablet, Rfl: 5;  hyoscyamine (LEVSIN/SL) 0.125 MG SL tablet, 1-2 tablets  by mouth every 4 hours as needed for abdominal pain, Disp: 60 tablet, Rfl: 11; methotrexate (RHEUMATREX) 2.5 MG tablet, Take 15 mg by mouth once a week. Caution:Chemotherapy. Protect from light.   (pt takes 6 tablets for total dose 15 MG )  pt takes on fridays., Disp: , Rfl: ;  pantoprazole (PROTONIX) 40 MG tablet, Take 1 tablet (40 mg total) by mouth daily., Disp: 60 tablet, Rfl: 3  His medication use has been sporadic because he did not like his primary care provider at his last visit at least a year ago and has not set up anything for followup. Was followed at [facility] but insurance no longer accepted. Lives in [place] but prefers care here.  Daughter in [facility] hopes to pediatrician  Review of Systems  Constitutional: Negative for fever, activity change and appetite change.  Cardiovascular: Negative for chest pain, palpitations and leg swelling.  Gastrointestinal: Negative for nausea, vomiting, abdominal pain, diarrhea, constipation, blood in stool and abdominal distention.  Genitourinary: Positive for testicular pain. Negative for dysuria, urgency, frequency, decreased urine volume, discharge, scrotal swelling and genital sores.   Objective:  Physical Exam  Vitals reviewed. Constitutional: He is oriented to person, place, and time. He appears well-developed and well-nourished. No distress.  HENT:  Head: Normocephalic and atraumatic.  Eyes: EOM are normal.  Neck: Neck supple.  Cardiovascular: Normal rate.   Pulmonary/Chest: Effort normal. No respiratory distress.  Abdominal:  Tenderness in RLQ that is mild with no rebound or guarding. Inguinal area clear with no sign of hernia.   Genitourinary:  Right testicle is very atrophic without clear  tenderness to palpation. The inguinal canal is clear. Left testicle is normal.   Musculoskeletal: Normal range of motion.  No flank pain to percussion. Straight leg raise normal. No pain with back ROM.  Neurological: He is  alert and oriented to person, place, and time.  Skin: Skin is warm and dry.  Psychiatric: He has a normal mood and affect. His behavior is normal.   Results for orders placed in visit on 10/30/13  POCT URINALYSIS DIPSTICK      Result Value Ref Range   Color, UA yellow     Clarity, UA clear     Glucose, UA neg     Bilirubin, UA neg     Ketones, UA neg     Spec Grav, UA 1.025     Blood, UA neg     pH, UA 6.0     Protein, UA 100     Urobilinogen, UA 1.0     Nitrite, UA neg     Leukocytes, UA Negative    POCT UA - MICROSCOPIC ONLY      Result Value Ref Range   WBC, Ur, HPF, POC 0-2     RBC, urine, microscopic 0     Bacteria, U Microscopic 0     Mucus, UA pos     Epithelial cells, urine per micros 0-1     Crystals, Ur, HPF, POC neg     Casts, Ur, LPF, POC neg     Yeast, UA neg      Assessment & Plan:  I have completed the patient encounter in its entirety as documented by the scribe, with editing by me where necessary. Hezzie Karim P. Laney Pastor, M.D.  Testicular pain - Plan: POCT urinalysis dipstick, POCT UA - Microscopic Only, Ambulatory referral to Urology  Atrophic testicle - Plan: Ambulatory referral to Urology  Unclear etiology but somewhat concerning for a retained testicle is undescended. No evidence for epididymitis. No evidence for kidney stone. Urological evaluation is next with possible imaging  Meds ordered this encounter  Medications  . meloxicam (MOBIC) 15 MG tablet    Sig: Take 1 tablet (15 mg total) by mouth daily.    Dispense:  30 tablet    Refill:  0   gave several names for primary care here and in [employer]

## 2013-12-24 ENCOUNTER — Ambulatory Visit (INDEPENDENT_AMBULATORY_CARE_PROVIDER_SITE_OTHER): Payer: PRIVATE HEALTH INSURANCE | Admitting: Emergency Medicine

## 2013-12-24 VITALS — BP 128/80 | HR 90 | Temp 99.4°F | Resp 17 | Ht 65.5 in | Wt 204.0 lb

## 2013-12-24 DIAGNOSIS — J209 Acute bronchitis, unspecified: Secondary | ICD-10-CM

## 2013-12-24 DIAGNOSIS — J40 Bronchitis, not specified as acute or chronic: Secondary | ICD-10-CM

## 2013-12-24 MED ORDER — PROMETHAZINE-CODEINE 6.25-10 MG/5ML PO SYRP: 5.0000 mL | ORAL_SOLUTION | Freq: Four times a day (QID) | ORAL | Status: DC | PRN

## 2013-12-24 MED ORDER — AZITHROMYCIN 250 MG PO TABS: ORAL_TABLET | ORAL | Status: DC

## 2013-12-24 NOTE — Patient Instructions (Signed)

## 2013-12-24 NOTE — Progress Notes (Signed)
[facility] [address] [place] [address] [phone]  Date:  12/24/2013   Name:  Jacob Cannon   DOB:  [birth year]   MRN:  [id number]  PCP:  Kathlene November, MD    Chief Complaint: Sore Throat, Cough, Palpitations and Fatigue   History of Present Illness:  Jacob Cannon is a 43 y.o. very pleasant male patient who presents with the following:  Ill for two weeks.  Says he has a sore throat and cough productive of green sputum.  Fatigued.  No fever or chills.  Has some wheezing and shortness of breath on exertion.  Non smoker.  No nausea or vomiting. No stool change.  No improvement with over the counter medications or other home remedies. Denies other complaint or health concern today.   Patient Active Problem List   Diagnosis Date Noted  . 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 Unspecified gastritis and gastroduodenitis without mention of hemorrhage 09/13/2011  . Abdominal pain, right upper quadrant 09/12/2011  . Chest pain 04/20/2011  . Cough 01/15/2011  . General medical examination 01/12/2011  . Hypertension   . Psoriasis   . Heart murmur   . OSA on CPAP   . Cardiomegaly     Past Medical History  Diagnosis Date  . Hypertension   . Arthritis     type?  . Psoriasis     used to see derm   . Heart murmur congenital  . OSA on CPAP 2010  . Cardiomegaly 2010    [facility]  . Hyperlipidemia   . Chest pain Chronic  . Hepatic steatosis   . GERD (gastroesophageal reflux disease)     Past Surgical History  Procedure Laterality Date  . Hernia repair      R inguinal  . Cardiac catheterization    . Esophagogastroduodenoscopy  09/13/2011    Procedure: ESOPHAGOGASTRODUODENOSCOPY (EGD);  Surgeon: Estanislado Emms., MD,FACG;  Location: [facility];  Service: Endoscopy;  Laterality: N/A;    History  Substance Use Topics  . Smoking status: Former Smoker    Quit date: 04/19/2001  . Smokeless tobacco: Never Used  . Alcohol Use: Yes   Comment: wine- occasionally    Family History  Problem Relation Age of Onset  . Multiple sclerosis Mother   . Heart disease      GM, father , brother (early age)  . Diabetes      father   . Colon cancer Neg Hx   . Prostate cancer Neg Hx     No Known Allergies  Medication list has been reviewed and updated.  Current Outpatient Prescriptions on File Prior to Visit  Medication Sig Dispense Refill  . azithromycin (ZITHROMAX) 250 MG tablet Take 2 tabs PO x 1 dose, then 1 tab PO QD x 4 days  6 tablet  0  . guaiFENesin-codeine (ROBITUSSIN AC) 100-10 MG/5ML syrup Take 5 mLs by mouth 3 (three) times daily as needed for cough.  240 mL  0  . hydrochlorothiazide 25 MG tablet Take 1 tablet (25 mg total) by mouth daily.  30 tablet  5  . hyoscyamine (LEVSIN/SL) 0.125 MG SL tablet 1-2 tablets by mouth every 4 hours as needed for abdominal pain  60 tablet  11  . lisinopril (PRINIVIL,ZESTRIL) 40 MG tablet TAKE 1 TABLET BY MOUTH DAILY  30 tablet  1  . meloxicam (MOBIC) 15 MG tablet Take 1 tablet (15 mg total) by mouth daily.  30 tablet  0  .  methotrexate (RHEUMATREX) 2.5 MG tablet Take 15 mg by mouth once a week. Caution:Chemotherapy. Protect from light.   (pt takes 6 tablets for total dose 15 MG )  pt takes on fridays.       No current facility-administered medications on file prior to visit.    Review of Systems:  As per HPI, otherwise negative.    Physical Examination: Filed Vitals:   12/24/13 1412  BP: 128/80  Pulse: 90  Temp: 99.4 F (37.4 C)  Resp: 17   Filed Vitals:   12/24/13 1412  Height: 5' 5.5" (1.664 m)  Weight: 204 lb (92.534 kg)   Body mass index is 33.42 kg/(m^2). Ideal Body Weight: Weight in (lb) to have BMI = 25: 152.2  GEN: WDWN, NAD, Non-toxic, A & O x 3 HEENT: Atraumatic, Normocephalic. Neck supple. No masses, No LAD. Ears and Nose: No external deformity. CV: RRR, No M/G/R. No JVD. No thrill. No extra heart sounds. PULM: CTA B, no wheezes, crackles, rhonchi.  No retractions. No resp. distress. No accessory muscle use. ABD: S, NT, ND, +BS. No rebound. No HSM. EXTR: No c/c/e NEURO Normal gait.  PSYCH: Normally interactive. Conversant. Not depressed or anxious appearing.  Calm demeanor.    Assessment and Plan: Bronchitis Pharyngitis Phen c cod zpak  Signed,  Ellison Carwin, MD

## 2014-01-01 ENCOUNTER — Telehealth: Payer: Self-pay

## 2014-01-01 MED ORDER — LISINOPRIL 40 MG PO TABS: ORAL_TABLET | ORAL | Status: DC

## 2014-01-01 NOTE — Telephone Encounter (Signed)
Refill

## 2014-01-04 ENCOUNTER — Other Ambulatory Visit: Payer: Self-pay

## 2014-01-04 MED ORDER — LISINOPRIL 40 MG PO TABS: ORAL_TABLET | ORAL | Status: DC

## 2014-03-26 ENCOUNTER — Ambulatory Visit (INDEPENDENT_AMBULATORY_CARE_PROVIDER_SITE_OTHER): Payer: PRIVATE HEALTH INSURANCE

## 2014-03-26 ENCOUNTER — Ambulatory Visit (INDEPENDENT_AMBULATORY_CARE_PROVIDER_SITE_OTHER): Payer: PRIVATE HEALTH INSURANCE | Admitting: Internal Medicine

## 2014-03-26 VITALS — BP 134/82 | HR 75 | Temp 98.4°F | Resp 16 | Ht 65.0 in | Wt 204.0 lb

## 2014-03-26 DIAGNOSIS — T148XXA Other injury of unspecified body region, initial encounter: Secondary | ICD-10-CM

## 2014-03-26 DIAGNOSIS — M25552 Pain in left hip: Secondary | ICD-10-CM

## 2014-03-26 DIAGNOSIS — M79609 Pain in unspecified limb: Secondary | ICD-10-CM

## 2014-03-26 DIAGNOSIS — G5710 Meralgia paresthetica, unspecified lower limb: Secondary | ICD-10-CM

## 2014-03-26 DIAGNOSIS — M79661 Pain in right lower leg: Secondary | ICD-10-CM

## 2014-03-26 DIAGNOSIS — M25459 Effusion, unspecified hip: Secondary | ICD-10-CM

## 2014-03-26 DIAGNOSIS — M79662 Pain in left lower leg: Secondary | ICD-10-CM

## 2014-03-26 DIAGNOSIS — N509 Disorder of male genital organs, unspecified: Secondary | ICD-10-CM

## 2014-03-26 DIAGNOSIS — G5712 Meralgia paresthetica, left lower limb: Secondary | ICD-10-CM

## 2014-03-26 DIAGNOSIS — N50811 Right testicular pain: Secondary | ICD-10-CM

## 2014-03-26 DIAGNOSIS — M25559 Pain in unspecified hip: Secondary | ICD-10-CM

## 2014-03-26 DIAGNOSIS — N500 Atrophy of testis: Secondary | ICD-10-CM

## 2014-03-26 DIAGNOSIS — M25452 Effusion, left hip: Secondary | ICD-10-CM

## 2014-03-26 NOTE — Patient Instructions (Signed)
Go to [facility] at 8:30am. Let them know you are there for a Vascular Study.

## 2014-03-26 NOTE — Progress Notes (Addendum)
Subjective:    This chart was scribed for Tami Lin, MD, by Neta Ehlers, ED Scribe. This patient's care was started at 4:50 PM.   Patient ID: Jacob Cannon, male    DOB: 1971/02/24, 43 y.o.   MRN: [id number]  HPI  Jacob Cannon is a 43 y.o. male who presents to [facility] complaining of acute pain to his left thigh which radiates to his groin and which began after a round of golf golf six days ago.  He asserts the pain is increased with ambulation, standing, and rolling over in bed. He reports the affected site has gradually become "hard" and "tight."  Mr. Willinger is treated with methotrexate for psoriatic arthritis. He reports his bilateral knee, elbow, and ankle joints are affected. He also endorses a possible h/o gout.   The pt's daughter is graduating from college this year with the intention of going to medical school to become a pediatrician.   Past Medical History  Diagnosis Date  . Hypertension   . Arthritis     type?  . Psoriasis     used to see derm   . Heart murmur congenital  . OSA on CPAP 2010  . Cardiomegaly 2010    [facility]  . Hyperlipidemia   . Chest pain Chronic  . Hepatic steatosis   . GERD (gastroesophageal reflux disease)    Past Surgical History  Procedure Laterality Date  . Hernia repair      R inguinal  . Cardiac catheterization    . Esophagogastroduodenoscopy  09/13/2011    Procedure: ESOPHAGOGASTRODUODENOSCOPY (EGD);  Surgeon: Estanislado Emms., MD,FACG;  Location: [facility];  Service: Endoscopy;  Laterality: N/A;   Current Outpatient Prescriptions on File Prior to Visit  Medication Sig Dispense Refill  . hyoscyamine (LEVSIN/SL) 0.125 MG SL tablet 1-2 tablets by mouth every 4 hours as needed for abdominal pain  60 tablet  11  . lisinopril (PRINIVIL,ZESTRIL) 40 MG tablet TAKE 1 TABLET BY MOUTH DAILY  15 tablet  0  . promethazine-codeine (PHENERGAN WITH CODEINE) 6.25-10 MG/5ML syrup Take 5-10 mLs by mouth every 6 (six) hours  as needed.  120 mL  0  . hydrochlorothiazide 25 MG tablet Take 1 tablet (25 mg total) by mouth daily.  30 tablet  5  . methotrexate (RHEUMATREX) 2.5 MG tablet Take 15 mg by mouth once a week. Caution:Chemotherapy. Protect from light.   (pt takes 6 tablets for total dose 15 MG )  pt takes on fridays.       No current facility-administered medications on file prior to visit.    Review of Systems  Constitutional: Negative for fever and chills.  Otherwise, see HPI.      Objective:   Physical Exam  Nursing note and vitals reviewed. Constitutional: He is oriented to person, place, and time. He appears well-developed and well-nourished. No distress.  HENT:  Head: Normocephalic and atraumatic.  Eyes: Conjunctivae and EOM are normal.  Neck: Neck supple. No tracheal deviation present.  Cardiovascular: Normal rate.   Pulmonary/Chest: Effort normal. No respiratory distress.  Musculoskeletal: He is very tender along the inner and posterior left thigh starting at the insertion on the initial tuberosity and forward and extending midway to the knee //there no defects //mildly swollen //decreased sensation over the anterior and medial thigh //hip range of motion is good with pain in the muscle but not the hip with external rotation  Neurological: He is alert and oriented to person, place, and time.  Skin: Skin is warm and dry.  Psychiatric: He has a normal mood and affect. His behavior is normal.  Vitals: BP 134/82  Pulse 75  Temp(Src) 98.4 F (36.9 C)  Resp 16  Ht 5\' 5"  (1.651 m)  Wt 204 lb (92.534 kg)  BMI 33.95 kg/m2  SpO2 96%  [facility] reading (PRIMARY) by  Dr. Herbie Baltimore Larraine Argo=NAD      Assessment & Plan:  Pain of left lower leg  And Pain, hip, left - due to Muscle strain with Meralgia paraesthetica, left and Swelling of joint of pelvic region or thigh, left - Plan: Lower Extremity Venous Duplex Left to rule DVT in view of his systemic disease  Testicular atrophy with discomfort - Plan:  Ambulatory referral to Urology will be made again as he did not go the last time   Meds ordered this encounter  Medications  . meloxicam (MOBIC) 15 MG tablet    Sig: Take 1 tablet (15 mg total) by mouth daily.    Dispense:  30 tablet    Refill:  0   Addendum 8/29=Doppler neg for dvt Will set up PT I have completed the patient encounter in its entirety as documented by the scribe, with editing by me where necessary. Maude Hettich P. Laney Pastor, M.D.

## 2014-03-27 ENCOUNTER — Telehealth: Payer: Self-pay | Admitting: *Deleted

## 2014-03-27 ENCOUNTER — Ambulatory Visit (HOSPITAL_COMMUNITY)
Admission: RE | Admit: 2014-03-27 | Discharge: 2014-03-27 | Disposition: A | Discharge status: Home / Self Care | Payer: PRIVATE HEALTH INSURANCE | Source: Ambulatory Visit | Attending: Internal Medicine | Admitting: Internal Medicine

## 2014-03-27 DIAGNOSIS — M7989 Other specified soft tissue disorders: Secondary | ICD-10-CM | POA: Diagnosis present

## 2014-03-27 DIAGNOSIS — M25552 Pain in left hip: Secondary | ICD-10-CM

## 2014-03-27 DIAGNOSIS — M25452 Effusion, left hip: Secondary | ICD-10-CM

## 2014-03-27 DIAGNOSIS — M79609 Pain in unspecified limb: Secondary | ICD-10-CM | POA: Diagnosis not present

## 2014-03-27 MED ORDER — MELOXICAM 15 MG PO TABS: 15.0000 mg | ORAL_TABLET | Freq: Every day | ORAL | Status: DC

## 2014-03-27 NOTE — Telephone Encounter (Signed)
Spoke with Jacob Cannon left about prescription being called in, ultrasound normal, and Dr Laney Pastor will set up physical therapy .

## 2014-03-27 NOTE — Telephone Encounter (Signed)
Called work and home,  Work number was not working  Home no answer.  No answer machine to leave message.

## 2014-03-27 NOTE — Progress Notes (Signed)
VASCULAR LAB PRELIMINARY  PRELIMINARY  PRELIMINARY  PRELIMINARY  Left lower extremity venous Doppler completed.    Preliminary report:  There is no DVT or SVT noted in the left lower extremity.   Willodene Stallings, RVT 03/27/2014, 8:50 AM

## 2014-08-27 ENCOUNTER — Other Ambulatory Visit: Payer: Self-pay

## 2014-08-27 MED ORDER — LISINOPRIL 40 MG PO TABS: ORAL_TABLET | ORAL | Status: DC

## 2014-12-10 ENCOUNTER — Ambulatory Visit: Payer: PRIVATE HEALTH INSURANCE

## 2015-03-06 ENCOUNTER — Other Ambulatory Visit: Payer: Self-pay | Admitting: Cardiology

## 2015-12-19 ENCOUNTER — Encounter (HOSPITAL_BASED_OUTPATIENT_CLINIC_OR_DEPARTMENT_OTHER): Payer: Self-pay | Admitting: *Deleted

## 2015-12-19 ENCOUNTER — Emergency Department (HOSPITAL_BASED_OUTPATIENT_CLINIC_OR_DEPARTMENT_OTHER): Payer: PRIVATE HEALTH INSURANCE

## 2015-12-19 ENCOUNTER — Emergency Department (HOSPITAL_BASED_OUTPATIENT_CLINIC_OR_DEPARTMENT_OTHER)
Admission: EM | Admit: 2015-12-19 | Discharge: 2015-12-19 | Disposition: A | Discharge status: Home / Self Care | Payer: PRIVATE HEALTH INSURANCE | Attending: Emergency Medicine | Admitting: Emergency Medicine

## 2015-12-19 DIAGNOSIS — I10 Essential (primary) hypertension: Secondary | ICD-10-CM | POA: Insufficient documentation

## 2015-12-19 DIAGNOSIS — M1990 Unspecified osteoarthritis, unspecified site: Secondary | ICD-10-CM | POA: Diagnosis not present

## 2015-12-19 DIAGNOSIS — Z87891 Personal history of nicotine dependence: Secondary | ICD-10-CM | POA: Insufficient documentation

## 2015-12-19 DIAGNOSIS — Z79899 Other long term (current) drug therapy: Secondary | ICD-10-CM | POA: Insufficient documentation

## 2015-12-19 DIAGNOSIS — R51 Headache: Secondary | ICD-10-CM | POA: Insufficient documentation

## 2015-12-19 DIAGNOSIS — E785 Hyperlipidemia, unspecified: Secondary | ICD-10-CM | POA: Diagnosis not present

## 2015-12-19 DIAGNOSIS — R079 Chest pain, unspecified: Secondary | ICD-10-CM | POA: Insufficient documentation

## 2015-12-19 DIAGNOSIS — R519 Headache, unspecified: Secondary | ICD-10-CM

## 2015-12-19 LAB — BASIC METABOLIC PANEL
Anion gap: 7 (ref 5–15)
BUN: 17 mg/dL (ref 6–20)
CO2: 26 mmol/L (ref 22–32)
CREATININE: 1.22 mg/dL (ref 0.61–1.24)
Calcium: 9 mg/dL (ref 8.9–10.3)
Chloride: 106 mmol/L (ref 101–111)
GFR calc Af Amer: >60 mL/min (ref >60)
GLUCOSE: 90 mg/dL (ref 65–99)
POTASSIUM: 4.3 mmol/L (ref 3.5–5.1)
SODIUM: 139 mmol/L (ref 135–145)

## 2015-12-19 LAB — CBC WITH DIFFERENTIAL/PLATELET
BASOS ABS: 0 10*3/uL (ref 0.0–0.1)
Basophils Relative: 0 %
EOS PCT: 6 %
Eosinophils Absolute: 0.4 10*3/uL (ref 0.0–0.7)
HCT: 48.1 % (ref 39.0–52.0)
Hemoglobin: 16.3 g/dL (ref 13.0–17.0)
LYMPHS ABS: 2.5 10*3/uL (ref 0.7–4.0)
LYMPHS PCT: 36 %
MCH: 29.3 pg (ref 26.0–34.0)
MCHC: 33.9 g/dL (ref 30.0–36.0)
MCV: 86.5 fL (ref 78.0–100.0)
MONO ABS: 0.7 10*3/uL (ref 0.1–1.0)
Monocytes Relative: 10 %
Neutro Abs: 3.3 10*3/uL (ref 1.7–7.7)
Neutrophils Relative %: 48 %
PLATELETS: 270 10*3/uL (ref 150–400)
RBC: 5.56 MIL/uL (ref 4.22–5.81)
RDW: 14.1 % (ref 11.5–15.5)
WBC: 6.8 10*3/uL (ref 4.0–10.5)

## 2015-12-19 LAB — TROPONIN I: Troponin I: <0.03 ng/mL (ref <0.031)

## 2015-12-19 MED ORDER — AZITHROMYCIN 250 MG PO TABS: 250.0000 mg | ORAL_TABLET | Freq: Every day | ORAL | Status: DC

## 2015-12-19 NOTE — ED Provider Notes (Signed)
CSN: [id number]     Arrival date & time 12/19/15  1515 History   First MD Initiated Contact with Patient 12/19/15 1617     Chief Complaint  Patient presents with  . Chest Pain   HPI  Jacob Cannon is a 45 y.o. male PMH significant for hypertension, hyperlipidemia, GERD, cardiomegaly, congenital heart murmur, presenting with a 1 month history of multiple complaints. He states over the last month, he has had intermittent chest pain which he describes as nonexertional, left sided, nonradiating, 4 out of 10 pain scale, hours in duration. He mentions he has had intermittent episodes of "blackouts" where he had profuse sweating, headaches, and shortness of breath. He endorses a recent upper respiratory infection and using his wife's albuterol inhaler. He states that his blood pressure is been elevated recently for which he has been "doubling up" on his blood pressure medication. He states his primary care provider is not aware of this. He states because of his coughing he had a friend, who works in pulmonology, listen to his lungs and the patient was told that his lung sound like "they have fluid on them." He denies fevers, chills, abdominal pain, nausea, vomiting, change in bowel or bladder habits. He denies pain currently but states his wife made him come to the ER because she was concerned.  Past Medical History  Diagnosis Date  . Hypertension   . Arthritis     type?  . Psoriasis     used to see derm   . Heart murmur congenital  . OSA on CPAP 2010  . Cardiomegaly 2010    [facility]  . Hyperlipidemia   . Chest pain Chronic  . Hepatic steatosis   . GERD (gastroesophageal reflux disease)    Past Surgical History  Procedure Laterality Date  . Hernia repair      R inguinal  . Cardiac catheterization    . Esophagogastroduodenoscopy  09/13/2011    Procedure: ESOPHAGOGASTRODUODENOSCOPY (EGD);  Surgeon: Estanislado Emms., MD,FACG;  Location: [facility];  Service: Endoscopy;   Laterality: N/A;   Family History  Problem Relation Age of Onset  . Multiple sclerosis Mother   . Heart disease      GM, father , brother (early age)  . Diabetes      father   . Colon cancer Neg Hx   . Prostate cancer Neg Hx    Social History  Substance Use Topics  . Smoking status: Former Smoker    Quit date: 04/19/2001  . Smokeless tobacco: Never Used  . Alcohol Use: Yes     Comment: wine- occasionally    Review of Systems  Ten systems are reviewed and are negative for acute change except as noted in the HPI   Allergies  Review of patient's allergies indicates no known allergies.  Home Medications   Prior to Admission medications   Medication Sig Start Date End Date Taking? Authorizing Provider  hydrochlorothiazide 25 MG tablet Take 1 tablet (25 mg total) by mouth daily. 01/12/11   Colon Branch, MD  hyoscyamine (LEVSIN/SL) 0.125 MG SL tablet 1-2 tablets by mouth every 4 hours as needed for abdominal pain 10/24/11   Ladene Artist, MD  lisinopril (PRINIVIL,ZESTRIL) 40 MG tablet TAKE 1 TABLET BY MOUTH DAILY NEED TO MAKE APPOINTMENT FOR FUTURE REFILLS 03/09/15   Lelon Perla, MD  meloxicam (MOBIC) 15 MG tablet Take 1 tablet (15 mg total) by mouth daily. 03/27/14   Leandrew Koyanagi, MD  methotrexate (  RHEUMATREX) 2.5 MG tablet Take 15 mg by mouth once a week. Caution:Chemotherapy. Protect from light.   (pt takes 6 tablets for total dose 15 MG )  pt takes on fridays.    Historical Provider, MD  promethazine-codeine (PHENERGAN WITH CODEINE) 6.25-10 MG/5ML syrup Take 5-10 mLs by mouth every 6 (six) hours as needed. 12/24/13   Roselee Culver, MD   BP 128/93 mmHg  Pulse 74  Temp(Src) 98.8 F (37.1 C) (Oral)  Resp 20  Ht 5' 5.5" (1.664 m)  Wt 92.534 kg  BMI 33.42 kg/m2  SpO2 100% Physical Exam  Constitutional: He is oriented to person, place, and time. He appears well-developed and well-nourished. No distress.  HENT:  Head: Normocephalic and atraumatic.  Right Ear:  External ear normal.  Left Ear: External ear normal.  Nose: Nose normal.  Mouth/Throat: Oropharynx is clear and moist. No oropharyngeal exudate.  Edematous uvula without deviation. Normal TMs bilaterally.  Eyes: Conjunctivae are normal. Pupils are equal, round, and reactive to light. Right eye exhibits no discharge. Left eye exhibits no discharge. No scleral icterus.  Neck: No tracheal deviation present.  Cardiovascular: Normal rate, regular rhythm, normal heart sounds and intact distal pulses.  Exam reveals no gallop and no friction rub.   No murmur heard. Pulmonary/Chest: Effort normal and breath sounds normal. No respiratory distress. He has no wheezes. He has no rales. He exhibits no tenderness.  Abdominal: Soft. Bowel sounds are normal. He exhibits no distension and no mass. There is no tenderness. There is no rebound and no guarding.  Musculoskeletal: He exhibits no edema.  Lymphadenopathy:    He has no cervical adenopathy.  Neurological: He is alert and oriented to person, place, and time. Coordination normal.  Cranial nerves II through XII grossly intact.  Skin: Skin is warm and dry. No rash noted. He is not diaphoretic. No erythema.  Psychiatric: He has a normal mood and affect. His behavior is normal.  Nursing note and vitals reviewed.   ED Course  Procedures (including critical care time) Labs Review Labs Reviewed  TROPONIN I  CBC WITH DIFFERENTIAL/PLATELET  BASIC METABOLIC PANEL    Imaging Review Dg Chest 2 View  12/19/2015  CLINICAL DATA:  Chest pain for 3 weeks, former smoker EXAM: CHEST  2 VIEW COMPARISON:  09/11/2011 FINDINGS: The heart size and mediastinal contours are within normal limits. Both lungs are clear. The visualized skeletal structures are unremarkable. IMPRESSION: No active cardiopulmonary disease. Electronically Signed   By: Skipper Cliche M.D.   On: 12/19/2015 16:04   Ct Head Wo Contrast  12/19/2015  CLINICAL DATA:  Multiple recent blackouts and  profusely sweating with chest pain and shortness of breath over the past month. EXAM: CT HEAD WITHOUT CONTRAST TECHNIQUE: Contiguous axial images were obtained from the base of the skull through the vertex without intravenous contrast. COMPARISON:  06/08/2011 and 03/31/2010 FINDINGS: Ventricles, cisterns and other CSF spaces normal. There is no mass, mass effect, shift of midline structures or acute hemorrhage. No evidence of acute infarction. Minimal opacification of the right maxillary sinus. The Remaining bones and soft tissues are normal. IMPRESSION: No acute intracranial findings. Electronically Signed   By: Marin Olp M.D.   On: 12/19/2015 17:16   I have personally reviewed and evaluated these images and lab results as part of my medical decision-making.   EKG Interpretation   Date/Time:  Monday Dec 19 2015 15:20:37 EDT Ventricular Rate:  75 PR Interval:  142 QRS Duration: 98 QT Interval:  404 QTC Calculation: 451 R Axis:   83 Text Interpretation:  Normal sinus rhythm Incomplete right bundle branch  block Cannot rule out Anterior infarct , age undetermined T wave  abnormality, consider inferolateral ischemia Abnormal ECG No significant  change since last tracing Confirmed by NGUYEN, EMILY ([id number]) on 12/19/2015  3:25:54 PM      MDM   Final diagnoses:  Chest pain, unspecified chest pain type  Headache, unspecified headache type   Patient is to be discharged with recommendation to follow up with PCP in regards to today's hospital visit. Chest pain is not likely of cardiac or pulmonary etiology d/t presentation, PERC negative, VSS, no tracheal deviation, no JVD or new murmur, RRR, breath sounds equal bilaterally, EKG without acute abnormalities, negative troponin, and negative CXR. Pt has been advised to return to the ED if CP becomes exertional, associated with diaphoresis or nausea, radiates to left jaw/arm, worsens or becomes concerning in any way. Pt appears reliable for follow up  and is agreeable to discharge.   Case has been discussed with and seen by Dr. Alfonse Spruce who agrees with the above plan to discharge.    [place], PA-C 12/29/15 1311  Harvel Quale, MD 12/31/15 [phone]

## 2015-12-19 NOTE — ED Notes (Signed)
Patient states that he has had multiple episodes of "blackouts" where he has been profusely sweating, chest pain and SOB over the last month. Patient had a friend listen to his lungs last week and the sounds "like they have fluid on them".

## 2015-12-19 NOTE — ED Notes (Signed)
Chest pain off and on for over a month. Cough. He has been using his wife's neb machine. His friend at work told him he has fluid in his lungs.

## 2015-12-19 NOTE — Discharge Instructions (Signed)
Mr. DONATHAN TOMER,  Nice meeting you! Please follow-up with your primary care provider and cardiology. Return to the emergency department if you develop increased chest pain, shortness of breath, increased headaches, nausea/vomiting, new/worsening symptoms. Feel better soon!  S. Wendie Simmer, PA-C

## 2016-03-14 ENCOUNTER — Ambulatory Visit (INDEPENDENT_AMBULATORY_CARE_PROVIDER_SITE_OTHER): Payer: PRIVATE HEALTH INSURANCE

## 2016-03-14 ENCOUNTER — Telehealth: Payer: Self-pay | Admitting: Family Medicine

## 2016-03-14 ENCOUNTER — Ambulatory Visit (INDEPENDENT_AMBULATORY_CARE_PROVIDER_SITE_OTHER): Payer: PRIVATE HEALTH INSURANCE | Admitting: Urgent Care

## 2016-03-14 ENCOUNTER — Ambulatory Visit (HOSPITAL_COMMUNITY)
Admission: RE | Admit: 2016-03-14 | Discharge: 2016-03-14 | Disposition: A | Discharge status: Home / Self Care | Payer: PRIVATE HEALTH INSURANCE | Source: Ambulatory Visit | Attending: Urgent Care | Admitting: Urgent Care

## 2016-03-14 VITALS — BP 132/80 | HR 75 | Temp 98.5°F | Resp 16 | Ht 65.5 in | Wt 210.0 lb

## 2016-03-14 DIAGNOSIS — K5900 Constipation, unspecified: Secondary | ICD-10-CM | POA: Diagnosis not present

## 2016-03-14 DIAGNOSIS — K869 Disease of pancreas, unspecified: Secondary | ICD-10-CM | POA: Diagnosis not present

## 2016-03-14 DIAGNOSIS — R112 Nausea with vomiting, unspecified: Secondary | ICD-10-CM

## 2016-03-14 DIAGNOSIS — E669 Obesity, unspecified: Secondary | ICD-10-CM

## 2016-03-14 DIAGNOSIS — R109 Unspecified abdominal pain: Secondary | ICD-10-CM | POA: Diagnosis not present

## 2016-03-14 DIAGNOSIS — R1084 Generalized abdominal pain: Secondary | ICD-10-CM | POA: Diagnosis present

## 2016-03-14 DIAGNOSIS — L409 Psoriasis, unspecified: Secondary | ICD-10-CM

## 2016-03-14 MED ORDER — IOPAMIDOL (ISOVUE-300) INJECTION 61%
100.0000 mL | Freq: Once | INTRAVENOUS | Status: AC | PRN
  Administered 2016-03-14: 100 mL via INTRAVENOUS

## 2016-03-14 MED ORDER — TRIAMCINOLONE ACETONIDE 0.5 % EX CREA: 1.0000 "application " | TOPICAL_CREAM | Freq: Two times a day (BID) | CUTANEOUS | 1 refills | Status: DC

## 2016-03-14 MED ORDER — DIATRIZOATE MEGLUMINE & SODIUM 66-10 % PO SOLN
30.0000 mL | Freq: Once | ORAL | Status: AC
  Administered 2016-03-14: 30 mL via ORAL

## 2016-03-14 NOTE — Telephone Encounter (Signed)
I called and left patient a voice message on his cell phone. I urged him to be NPO tonight and rtc first thing tomorrow. Consider referring to ED depending on symptom progression. Otherwise, will establish a plan with patient tomorrow morning.

## 2016-03-14 NOTE — Telephone Encounter (Signed)
Received answering service call re: pt's CT scan - tech from [facility] calling to report stat results.  State the pt left an hour ago - told the tech that the provider informed him it was ok to leave and he would be called with the results. Called cell # on file [phone] - phone turned off, generic in-box, LVM req call back to answering service to speak with [facility] or myself immed. Called home [phone] - no answer or VM Called work cell [phone] - wireless customer not avail Called cell twice more - no answer.  If anyone gets ahold of pt, please make him npo, recheck first thing Thurs morning. If worsening abd pain/n/v o/n, needs to call 911 and go to ER

## 2016-03-14 NOTE — Progress Notes (Signed)
MRN: [id number] DOB: March 17, 1971  Subjective:   Jacob Cannon is a 45 y.o. male presenting for chief complaint of Constipation (abdominal pain, feels like stomach is very tight )  Reports 2 month history of upper abdominal pain, now having constipation for the past 5 days, subjective fever. Bowel movements are every other 1-2 days, strains to have bowel movements and are associated with back pain. Stools are generally dark brown. He did have 2 vomiting episodes yesterday because he has had difficulty with his bowel movements. Has also had very little urinating. Has tried hydrating well, using otc laxative with minimal improvement. He was advised to go to [place] last night but was unwilling to drive there.   Jacob Cannon has a current medication list which includes the following prescription(s): lisinopril. Also has No Known Allergies.  [name]  has a past medical history of Arthritis; Cardiomegaly (2010); Chest pain (Chronic); GERD (gastroesophageal reflux disease); Heart murmur (congenital); Hepatic steatosis; Hyperlipidemia; Hypertension; OSA on CPAP (2010); and Psoriasis. Also  has a past surgical history that includes Hernia repair; Cardiac catheterization; and Esophagogastroduodenoscopy (09/13/2011).  Objective:   Vitals: BP 132/80 (BP Location: Left Arm, Patient Position: Sitting, Cuff Size: Large)   Pulse 75   Temp 98.5 F (36.9 C) (Oral)   Resp 16   Ht 5' 5.5" (1.664 m)   Wt 210 lb (95.3 kg)   SpO2 97%   BMI 34.41 kg/m   Physical Exam  Constitutional: He is oriented to person, place, and time. He appears well-developed and well-nourished.  Cardiovascular: Normal rate, regular rhythm and intact distal pulses.  Exam reveals no gallop and no friction rub.   No murmur heard. Pulmonary/Chest: No respiratory distress. He has no wheezes. He has no rales.  Abdominal: Soft. Bowel sounds are normal. He exhibits distension. He exhibits no mass. There is tenderness.  Neurological:  He is alert and oriented to person, place, and time.  Skin: Skin is warm and dry. Rash (Multiple patches of dry scaly annular lesions over both upper extremities and abdomen) noted.   Dg Abd 2 Views  Result Date: 03/14/2016 CLINICAL DATA:  Two month history of upper abdominal pain. Recent constipation. Vomiting. EXAM: ABDOMEN - 2 VIEW COMPARISON:  None. FINDINGS: Bowel gas pattern does not show evidence of ileus, obstruction or free air. There are some air-fluid levels in the colon as might be seen with liquid stool or enemas. Small bowel pattern is normal. No abnormal calcifications or bone findings. IMPRESSION: Negative radiographs. Small air-fluid levels in the colon could be seen with liquid stool or recent enemas. Electronically Signed   By: Jacob Cannon M.D.   On: 03/14/2016 17:14    Assessment and Plan :   1. Generalized abdominal pain 2. Nausea and vomiting, intractability of vomiting not specified, unspecified vomiting type 3. Constipation, unspecified constipation type - Unclear etiology, will obtain stat CT for further evaluation. If normal, will manage as constipation.  4. Obesity - A1c pending  5. Psoriasis - Start triamcinolone 0.5%, rtc in 3-4 weeks if no improvement.  Jacob Eagles, PA-C [facility] [phone] 03/14/2016 4:46 PM    UPDATE: Ct Abdomen Pelvis W Contrast  Result Date: 03/14/2016 CLINICAL DATA:  Generalized abdominal pain with nausea vomiting and abdominal distention for 1 week. EXAM: CT ABDOMEN AND PELVIS WITH CONTRAST TECHNIQUE: Multidetector CT imaging of the abdomen and pelvis was performed using the standard protocol following bolus administration of intravenous contrast. CONTRAST:  135mL  ISOVUE-300 IOPAMIDOL (ISOVUE-300) INJECTION 61% COMPARISON:  None. FINDINGS: Lower chest:  Unremarkable Hepatobiliary: Scattered tiny hypodensities scattered in the liver parenchyma are too small to characterize but likely  represent tiny cysts. There is no evidence for gallstones, gallbladder wall thickening, or pericholecystic fluid. No intrahepatic or extrahepatic biliary dilation. Pancreas: There is some mild fullness in the head of the pancreas with subtle peripancreatic edema in the region the pancreatic head (see axial image 20 of series 2 and sagittal image 92 of series 6). No dilatation of the main pancreatic duct. Spleen: No splenomegaly. No focal mass lesion. Adrenals/Urinary Tract: No adrenal nodule or mass. Small hypodensities in the kidneys bilaterally cannot be fully characterized but likely represent cysts. A 16 mm low-density lesion in the upper pole the left kidney has an apparent enhancing nodule along its medial border (image 9 series 7). No evidence for hydroureter. The urinary bladder appears normal for the degree of distention. Stomach/Bowel: Stomach is nondistended. No gastric wall thickening. No evidence of outlet obstruction. Duodenum is normally positioned as is the ligament of Treitz. No small bowel wall thickening. No small bowel dilatation. The terminal ileum is normal. The appendix is normal. No gross colonic mass. No colonic wall thickening. No substantial diverticular change. Vascular/Lymphatic: No abdominal aortic aneurysm. There is no gastrohepatic or hepatoduodenal ligament lymphadenopathy. No intraperitoneal or retroperitoneal lymphadenopathy. Small external iliac lymph nodes are seen along both pelvic sidewalls, but no pelvic lymphadenopathy is evident. Reproductive: The prostate gland and seminal vesicles have normal imaging features. Other: Trace free fluid is identified in the anatomic pelvis. Musculoskeletal: Bone windows reveal no worrisome lytic or sclerotic osseous lesions. IMPRESSION: 1. Fullness and subtle heterogeneity identified in the head of the pancreas with associated subtle peripancreatic edema/inflammation in the same region. Imaging features suggest focal pancreatitis. Given the  normal appearance of the duodenum without wall thickening, duodenitis is considered less likely. Given patient age, mass lesion is also considered they less likely possibility. Follow-up MRI without and with contrast recommended to further evaluate. Electronically Signed   By: Misty Stanley M.D.   On: 03/14/2016 21:44   Dg Abd 2 Views  Result Date: 03/14/2016 CLINICAL DATA:  Two month history of upper abdominal pain. Recent constipation. Vomiting. EXAM: ABDOMEN - 2 VIEW COMPARISON:  None. FINDINGS: Bowel gas pattern does not show evidence of ileus, obstruction or free air. There are some air-fluid levels in the colon as might be seen with liquid stool or enemas. Small bowel pattern is normal. No abnormal calcifications or bone findings. IMPRESSION: Negative radiographs. Small air-fluid levels in the colon could be seen with liquid stool or recent enemas. Electronically Signed   By: Jacob Cannon M.D.   On: 03/14/2016 17:14     See telephone encounters from 03/14/2016. Multiple attempts have been made to communicate with the patient regarding these findings and plan to treat. Patient is to be NPO, will f/u in the morning.

## 2016-03-14 NOTE — Patient Instructions (Addendum)
You are to go over to [place] now for your CT scan. Address: [address], [place], [place] [address]  [phone]    Abdominal Pain, Adult Many things can cause abdominal pain. Usually, abdominal pain is not caused by a disease and will improve without treatment. It can often be observed and treated at home. Your health care provider will do a physical exam and possibly order blood tests and X-rays to help determine the seriousness of your pain. However, in many cases, more time must pass before a clear cause of the pain can be found. Before that point, your health care provider may not know if you need more testing or further treatment. HOME CARE INSTRUCTIONS Monitor your abdominal pain for any changes. The following actions may help to alleviate any discomfort you are experiencing:  Only take over-the-counter or prescription medicines as directed by your health care provider.  Do not take laxatives unless directed to do so by your health care provider.  Try a clear liquid diet (broth, tea, or water) as directed by your health care provider. Slowly move to a bland diet as tolerated. SEEK MEDICAL CARE IF:  You have unexplained abdominal pain.  You have abdominal pain associated with nausea or diarrhea.  You have pain when you urinate or have a bowel movement.  You experience abdominal pain that wakes you in the night.  You have abdominal pain that is worsened or improved by eating food.  You have abdominal pain that is worsened with eating fatty foods.  You have a fever. SEEK IMMEDIATE MEDICAL CARE IF:  Your pain does not go away within 2 hours.  You keep throwing up (vomiting).  Your pain is felt only in portions of the abdomen, such as the right side or the left lower portion of the abdomen.  You pass bloody or black tarry stools. MAKE SURE YOU:  Understand these instructions.  Will watch your condition.  Will get help right away if you are not doing well  or get worse.   This information is not intended to replace advice given to you by your health care provider. Make sure you discuss any questions you have with your health care provider.   Document Released: 04/25/2005 Document Revised: 04/06/2015 Document Reviewed: 03/25/2013 Elsevier Interactive Patient Education 2016 [employer].     IF you received an x-ray today, you will receive an invoice from [facility]. Please contact [facility] at [phone] with questions or concerns regarding your invoice.   IF you received labwork today, you will receive an invoice from [employer]. Please contact [facility] at [phone] with questions or concerns regarding your invoice.   Our billing staff will not be able to assist you with questions regarding bills from these companies.  You will be contacted with the lab results as soon as they are available. The fastest way to get your results is to activate your My Chart account. Instructions are located on the last page of this paperwork. If you have not heard from [place] regarding the results in 2 weeks, please contact this office.

## 2016-03-15 ENCOUNTER — Inpatient Hospital Stay (HOSPITAL_COMMUNITY): Payer: PRIVATE HEALTH INSURANCE

## 2016-03-15 ENCOUNTER — Inpatient Hospital Stay
Admission: AD | Admit: 2016-03-15 | Payer: PRIVATE HEALTH INSURANCE | Source: Ambulatory Visit | Admitting: Family Medicine

## 2016-03-15 ENCOUNTER — Ambulatory Visit (INDEPENDENT_AMBULATORY_CARE_PROVIDER_SITE_OTHER): Payer: PRIVATE HEALTH INSURANCE | Admitting: Urgent Care

## 2016-03-15 ENCOUNTER — Encounter (HOSPITAL_COMMUNITY): Payer: Self-pay

## 2016-03-15 ENCOUNTER — Inpatient Hospital Stay (HOSPITAL_COMMUNITY)
Admission: EM | Admit: 2016-03-15 | Discharge: 2016-03-19 | DRG: 440 | Disposition: A | Discharge status: Home / Self Care | Payer: PRIVATE HEALTH INSURANCE | Attending: Internal Medicine | Admitting: Internal Medicine

## 2016-03-15 VITALS — BP 126/88 | HR 79 | Temp 98.6°F | Resp 17 | Ht 65.5 in | Wt 206.0 lb

## 2016-03-15 DIAGNOSIS — R109 Unspecified abdominal pain: Secondary | ICD-10-CM | POA: Diagnosis present

## 2016-03-15 DIAGNOSIS — Z87891 Personal history of nicotine dependence: Secondary | ICD-10-CM | POA: Diagnosis not present

## 2016-03-15 DIAGNOSIS — K859 Acute pancreatitis without necrosis or infection, unspecified: Secondary | ICD-10-CM | POA: Diagnosis not present

## 2016-03-15 DIAGNOSIS — Z8249 Family history of ischemic heart disease and other diseases of the circulatory system: Secondary | ICD-10-CM

## 2016-03-15 DIAGNOSIS — K8500 Idiopathic acute pancreatitis without necrosis or infection: Secondary | ICD-10-CM | POA: Diagnosis not present

## 2016-03-15 DIAGNOSIS — I10 Essential (primary) hypertension: Secondary | ICD-10-CM | POA: Diagnosis present

## 2016-03-15 DIAGNOSIS — Z7982 Long term (current) use of aspirin: Secondary | ICD-10-CM

## 2016-03-15 DIAGNOSIS — R1010 Upper abdominal pain, unspecified: Secondary | ICD-10-CM | POA: Diagnosis not present

## 2016-03-15 LAB — CBC WITH DIFFERENTIAL/PLATELET
BASOS PCT: 1 %
Basophils Absolute: 0 10*3/uL (ref 0.0–0.1)
EOS ABS: 0.2 10*3/uL (ref 0.0–0.7)
Eosinophils Relative: 4 %
HCT: 47.9 % (ref 39.0–52.0)
HEMOGLOBIN: 16.2 g/dL (ref 13.0–17.0)
Lymphocytes Relative: 36 %
Lymphs Abs: 1.9 10*3/uL (ref 0.7–4.0)
MCH: 29.5 pg (ref 26.0–34.0)
MCHC: 33.8 g/dL (ref 30.0–36.0)
MCV: 87.2 fL (ref 78.0–100.0)
Monocytes Absolute: 0.7 10*3/uL (ref 0.1–1.0)
Monocytes Relative: 12 %
NEUTROS PCT: 47 %
Neutro Abs: 2.6 10*3/uL (ref 1.7–7.7)
Platelets: 223 10*3/uL (ref 150–400)
RBC: 5.49 MIL/uL (ref 4.22–5.81)
RDW: 13.2 % (ref 11.5–15.5)
WBC: 5.5 10*3/uL (ref 4.0–10.5)

## 2016-03-15 LAB — POCT CBC
GRANULOCYTE PERCENT: 62.3 % (ref 37–80)
HEMATOCRIT: 49.5 % (ref 43.5–53.7)
HEMOGLOBIN: 17.9 g/dL (ref 14.1–18.1)
Lymph, poc: 1.8 (ref 0.6–3.4)
MCH: 30.6 pg (ref 27–31.2)
MCHC: 36.2 g/dL — AB (ref 31.8–35.4)
MCV: 84.5 fL (ref 80–97)
MID (cbc): 0.4 (ref 0–0.9)
MPV: 7.2 fL (ref 0–99.8)
POC GRANULOCYTE: 3.6 (ref 2–6.9)
POC LYMPH PERCENT: 30.7 %L (ref 10–50)
POC MID %: 7 % (ref 0–12)
Platelet Count, POC: 231 10*3/uL (ref 142–424)
RBC: 5.86 M/uL (ref 4.69–6.13)
RDW, POC: 13.7 %
WBC: 5.8 10*3/uL (ref 4.6–10.2)

## 2016-03-15 LAB — COMPREHENSIVE METABOLIC PANEL
ALBUMIN: 4.2 g/dL (ref 3.5–5.0)
ALK PHOS: 64 U/L (ref 38–126)
ALT: 23 U/L (ref 17–63)
ANION GAP: 7 (ref 5–15)
AST: 24 U/L (ref 15–41)
BILIRUBIN TOTAL: 1.1 mg/dL (ref 0.3–1.2)
BUN: 12 mg/dL (ref 6–20)
CALCIUM: 8.3 mg/dL — AB (ref 8.9–10.3)
CO2: 25 mmol/L (ref 22–32)
Chloride: 103 mmol/L (ref 101–111)
Creatinine, Ser: 1.14 mg/dL (ref 0.61–1.24)
GFR calc non Af Amer: >60 mL/min (ref >60)
Glucose, Bld: 85 mg/dL (ref 65–99)
Potassium: 4.6 mmol/L (ref 3.5–5.1)
Sodium: 135 mmol/L (ref 135–145)
TOTAL PROTEIN: 7.4 g/dL (ref 6.5–8.1)

## 2016-03-15 LAB — COMPLETE METABOLIC PANEL WITH GFR
ALT: 23 U/L (ref 9–46)
AST: 27 U/L (ref 10–40)
Albumin: 4.8 g/dL (ref 3.6–5.1)
Alkaline Phosphatase: 70 U/L (ref 40–115)
BILIRUBIN TOTAL: 0.9 mg/dL (ref 0.2–1.2)
BUN: 13 mg/dL (ref 7–25)
CHLORIDE: 99 mmol/L (ref 98–110)
CO2: 27 mmol/L (ref 20–31)
Calcium: 9.5 mg/dL (ref 8.6–10.3)
Creat: 1.18 mg/dL (ref 0.60–1.35)
GFR, EST AFRICAN AMERICAN: 86 mL/min (ref >=60)
GFR, EST NON AFRICAN AMERICAN: 74 mL/min (ref >=60)
GLUCOSE: 84 mg/dL (ref 65–99)
POTASSIUM: 4.5 mmol/L (ref 3.5–5.3)
SODIUM: 135 mmol/L (ref 135–146)
Total Protein: 7.7 g/dL (ref 6.1–8.1)

## 2016-03-15 LAB — LACTIC ACID, PLASMA: LACTIC ACID, VENOUS: 0.8 mmol/L (ref 0.5–1.9)

## 2016-03-15 LAB — LIPID PANEL
CHOL/HDL RATIO: 4.4 ratio (ref <=5.0)
Cholesterol: 190 mg/dL (ref 125–200)
HDL: 43 mg/dL (ref >=40)
LDL Cholesterol: 129 mg/dL (ref <130)
TRIGLYCERIDES: 88 mg/dL (ref <150)
VLDL: 18 mg/dL (ref <30)

## 2016-03-15 LAB — CBC
HEMATOCRIT: 48.7 % (ref 38.5–50.0)
Hemoglobin: 17.1 g/dL (ref 13.2–17.1)
MCH: 30.1 pg (ref 27.0–33.0)
MCHC: 35.1 g/dL (ref 32.0–36.0)
MCV: 85.7 fL (ref 80.0–100.0)
MPV: 9.2 fL (ref 7.5–12.5)
PLATELETS: 263 10*3/uL (ref 140–400)
RBC: 5.68 MIL/uL (ref 4.20–5.80)
RDW: 13.7 % (ref 11.0–15.0)
WBC: 5.4 10*3/uL (ref 3.8–10.8)

## 2016-03-15 LAB — HEMOGLOBIN A1C
HEMOGLOBIN A1C: 5.8 % — AB (ref <5.7)
Mean Plasma Glucose: 120 mg/dL

## 2016-03-15 LAB — LIPASE: Lipase: 47 U/L (ref 7–60)

## 2016-03-15 LAB — LIPASE, BLOOD: Lipase: 44 U/L (ref 11–51)

## 2016-03-15 MED ORDER — HYDRALAZINE HCL 20 MG/ML IJ SOLN: 10.0000 mg | Freq: Four times a day (QID) | INTRAMUSCULAR | Status: DC | PRN

## 2016-03-15 MED ORDER — SODIUM CHLORIDE 0.9 % IV SOLN
INTRAVENOUS | Status: DC
Start: 2016-03-15 — End: 2016-03-15

## 2016-03-15 MED ORDER — SODIUM CHLORIDE 0.9 % IV BOLUS (SEPSIS)
1000.0000 mL | Freq: Once | INTRAVENOUS | Status: AC
  Administered 2016-03-15: 1000 mL via INTRAVENOUS

## 2016-03-15 MED ORDER — POLYETHYLENE GLYCOL 3350 17 G PO PACK
17.0000 g | PACK | Freq: Every day | ORAL | Status: DC | PRN
  Administered 2016-03-17: 17 g via ORAL
  Filled 2016-03-15: qty 1

## 2016-03-15 MED ORDER — ONDANSETRON HCL 4 MG/2ML IJ SOLN
4.0000 mg | Freq: Once | INTRAMUSCULAR | Status: AC
  Administered 2016-03-15: 4 mg via INTRAVENOUS
  Filled 2016-03-15: qty 2

## 2016-03-15 MED ORDER — MORPHINE SULFATE (PF) 4 MG/ML IV SOLN
4.0000 mg | INTRAVENOUS | Status: DC | PRN
  Administered 2016-03-15 – 2016-03-16 (×7): 4 mg via INTRAVENOUS
  Filled 2016-03-15 (×7): qty 1

## 2016-03-15 MED ORDER — MORPHINE SULFATE (PF) 4 MG/ML IV SOLN
6.0000 mg | Freq: Once | INTRAVENOUS | Status: AC
  Administered 2016-03-15: 6 mg via INTRAVENOUS
  Filled 2016-03-15: qty 2

## 2016-03-15 MED ORDER — GADOBENATE DIMEGLUMINE 529 MG/ML IV SOLN
20.0000 mL | Freq: Once | INTRAVENOUS | Status: AC | PRN
  Administered 2016-03-15: 19 mL via INTRAVENOUS

## 2016-03-15 MED ORDER — KCL IN DEXTROSE-NACL 10-5-0.45 MEQ/L-%-% IV SOLN
INTRAVENOUS | Status: DC
  Administered 2016-03-15 – 2016-03-16 (×2): via INTRAVENOUS
  Filled 2016-03-15 (×4): qty 1000

## 2016-03-15 MED ORDER — ENOXAPARIN SODIUM 40 MG/0.4ML ~~LOC~~ SOLN
40.0000 mg | SUBCUTANEOUS | Status: DC
  Administered 2016-03-15 – 2016-03-17 (×3): 40 mg via SUBCUTANEOUS
  Filled 2016-03-15 (×3): qty 0.4

## 2016-03-15 NOTE — ED Notes (Signed)
PA at bedside.

## 2016-03-15 NOTE — ED Notes (Signed)
PT not in rm to complete ekg

## 2016-03-15 NOTE — Progress Notes (Signed)
MRN: [id number] DOB: April 20, 1971  Subjective:   Jacob Cannon is a 45 y.o. male presenting for follow up on abdominal pain, pancreatitis.   Patient is presenting for follow from his visit yesterday. His abdominal CT was suggestive of pancreatitis and recommendations were made for further imaging. Patient reports today that he has intermittently worsened upper abdominal pain, rated 9/10, radiates to his back decreased appetite, nausea.   Jacob Cannon has a current medication list which includes the following prescription(s): lisinopril and triamcinolone cream. Also has No Known Allergies.  Jacob Cannon  has a past medical history of Arthritis; Cardiomegaly (2010); Chest pain (Chronic); GERD (gastroesophageal reflux disease); Heart murmur (congenital); Hepatic steatosis; Hyperlipidemia; Hypertension; OSA on CPAP (2010); and Psoriasis. Also  has a past surgical history that includes Hernia repair; Cardiac catheterization; and Esophagogastroduodenoscopy (09/13/2011).  Objective:   Vitals: BP 126/88 (BP Location: Left Arm, Patient Position: Sitting, Cuff Size: Normal)   Pulse 79   Temp 98.6 F (37 C) (Oral)   Resp 17   Ht 5' 5.5" (1.664 m)   Wt 206 lb (93.4 kg)   SpO2 97%   BMI 33.76 kg/m   Physical Exam  Constitutional: He is oriented to person, place, and time. He appears well-developed and well-nourished.  HENT:  Mouth/Throat: Oropharynx is clear and moist.  Cardiovascular: Normal rate, regular rhythm and intact distal pulses.  Exam reveals no gallop and no friction rub.   No murmur heard. Pulmonary/Chest: No respiratory distress. He has no wheezes. He has no rales.  Abdominal: Soft. Bowel sounds are normal. He exhibits no distension and no mass. There is tenderness (throughout, worst over upper abdomen).  Neurological: He is alert and oriented to person, place, and time.  Skin: Skin is warm and dry.   Results for orders placed or performed in visit on 03/15/16 (from the past 24  hour(s))  POCT CBC     Status: Abnormal   Collection Time: 03/15/16  9:29 AM  Result Value Ref Range   WBC 5.8 4.6 - 10.2 K/uL   Lymph, poc 1.8 0.6 - 3.4   POC LYMPH PERCENT 30.7 10 - 50 %L   MID (cbc) 0.4 0 - 0.9   POC MID % 7.0 0 - 12 %M   POC Granulocyte 3.6 2 - 6.9   Granulocyte percent 62.3 37 - 80 %G   RBC 5.86 4.69 - 6.13 M/uL   Hemoglobin 17.9 14.1 - 18.1 g/dL   HCT, POC 49.5 43.5 - 53.7 %   MCV 84.5 80 - 97 fL   MCH, POC 30.6 27 - 31.2 pg   MCHC 36.2 (A) 31.8 - 35.4 g/dL   RDW, POC 13.7 %   Platelet Count, POC 231 142 - 424 K/uL   MPV 7.2 0 - 99.8 fL   Ct Abdomen Pelvis W Contrast  Result Date: 03/14/2016 CLINICAL DATA:  Generalized abdominal pain with nausea vomiting and abdominal distention for 1 week. EXAM: CT ABDOMEN AND PELVIS WITH CONTRAST TECHNIQUE: Multidetector CT imaging of the abdomen and pelvis was performed using the standard protocol following bolus administration of intravenous contrast. CONTRAST:  132mL ISOVUE-300 IOPAMIDOL (ISOVUE-300) INJECTION 61% COMPARISON:  None. FINDINGS: Lower chest:  Unremarkable Hepatobiliary: Scattered tiny hypodensities scattered in the liver parenchyma are too small to characterize but likely represent tiny cysts. There is no evidence for gallstones, gallbladder wall thickening, or pericholecystic fluid. No intrahepatic or extrahepatic biliary dilation. Pancreas: There is some mild fullness in the head of the pancreas with subtle peripancreatic  edema in the region the pancreatic head (see axial image 20 of series 2 and sagittal image 92 of series 6). No dilatation of the main pancreatic duct. Spleen: No splenomegaly. No focal mass lesion. Adrenals/Urinary Tract: No adrenal nodule or mass. Small hypodensities in the kidneys bilaterally cannot be fully characterized but likely represent cysts. A 16 mm low-density lesion in the upper pole the left kidney has an apparent enhancing nodule along its medial border (image 9 series 7). No evidence  for hydroureter. The urinary bladder appears normal for the degree of distention. Stomach/Bowel: Stomach is nondistended. No gastric wall thickening. No evidence of outlet obstruction. Duodenum is normally positioned as is the ligament of Treitz. No small bowel wall thickening. No small bowel dilatation. The terminal ileum is normal. The appendix is normal. No gross colonic mass. No colonic wall thickening. No substantial diverticular change. Vascular/Lymphatic: No abdominal aortic aneurysm. There is no gastrohepatic or hepatoduodenal ligament lymphadenopathy. No intraperitoneal or retroperitoneal lymphadenopathy. Small external iliac lymph nodes are seen along both pelvic sidewalls, but no pelvic lymphadenopathy is evident. Reproductive: The prostate gland and seminal vesicles have normal imaging features. Other: Trace free fluid is identified in the anatomic pelvis. Musculoskeletal: Bone windows reveal no worrisome lytic or sclerotic osseous lesions. IMPRESSION: 1. Fullness and subtle heterogeneity identified in the head of the pancreas with associated subtle peripancreatic edema/inflammation in the same region. Imaging features suggest focal pancreatitis. Given the normal appearance of the duodenum without wall thickening, duodenitis is considered less likely. Given patient age, mass lesion is also considered they less likely possibility. Follow-up MRI without and with contrast recommended to further evaluate. Electronically Signed   By: Misty Stanley M.D.   On: 03/14/2016 21:44   Dg Abd 2 Views  Result Date: 03/14/2016 CLINICAL DATA:  Two month history of upper abdominal pain. Recent constipation. Vomiting. EXAM: ABDOMEN - 2 VIEW COMPARISON:  None. FINDINGS: Bowel gas pattern does not show evidence of ileus, obstruction or free air. There are some air-fluid levels in the colon as might be seen with liquid stool or enemas. Small bowel pattern is normal. No abnormal calcifications or bone findings. IMPRESSION:  Negative radiographs. Small air-fluid levels in the colon could be seen with liquid stool or recent enemas. Electronically Signed   By: Nelson Chimes M.D.   On: 03/14/2016 17:14    Assessment and Plan :   1. Pain of upper abdomen 2. Acute pancreatitis, unspecified pancreatitis type - Discussed case with Dr. Lincoln Brigham and Dr. Caryl Pina for direct admission, continued management of pancreatitis. Currently, there are no beds available, recommendation was made to have patient transported to [facility] ED until a bed becomes available. We have called [facility] and reported his case. Patient verbalized understanding.   Jaynee Eagles, PA-C [facility] [phone] 03/15/2016 10:07 AM

## 2016-03-15 NOTE — Patient Instructions (Addendum)
Acute Pancreatitis Acute pancreatitis is a disease in which the pancreas becomes suddenly inflamed. The pancreas is a large gland located behind your stomach. The pancreas produces enzymes that help digest food. The pancreas also releases the hormones glucagon and insulin that help regulate blood sugar. Damage to the pancreas occurs when the digestive enzymes from the pancreas are activated and begin attacking the pancreas before being released into the intestine. Most acute attacks last a couple of days and can cause serious complications. Some people become dehydrated and develop low blood pressure. In severe cases, bleeding into the pancreas can lead to shock and can be life-threatening. The lungs, heart, and kidneys may fail. CAUSES  Pancreatitis can happen to anyone. In some cases, the cause is unknown. Most cases are caused by:  Alcohol abuse.  Gallstones. Other less common causes are:  Certain medicines.  Exposure to certain chemicals.  Infection.  Damage caused by an accident (trauma).  Abdominal surgery. SYMPTOMS   Pain in the upper abdomen that may radiate to the back.  Tenderness and swelling of the abdomen.  Nausea and vomiting. DIAGNOSIS  Your caregiver will perform a physical exam. Blood and stool tests may be done to confirm the diagnosis. Imaging tests may also be done, such as X-rays, CT scans, or an ultrasound of the abdomen. TREATMENT  Treatment usually requires a stay in the hospital. Treatment may include:  Pain medicine.  Fluid replacement through an intravenous line (IV).  Placing a tube in the stomach to remove stomach contents and control vomiting.  Not eating for 3 or 4 days. This gives your pancreas a rest, because enzymes are not being produced that can cause further damage.  Antibiotic medicines if your condition is caused by an infection.  Surgery of the pancreas or gallbladder. HOME CARE INSTRUCTIONS   Follow the diet advised by your  caregiver. This may involve avoiding alcohol and decreasing the amount of fat in your diet.  Eat smaller, more frequent meals. This reduces the amount of digestive juices the pancreas produces.  Drink enough fluids to keep your urine clear or pale yellow.  Only take over-the-counter or prescription medicines as directed by your caregiver.  Avoid drinking alcohol if it caused your condition.  Do not smoke.  Get plenty of rest.  Check your blood sugar at home as directed by your caregiver.  Keep all follow-up appointments as directed by your caregiver. SEEK MEDICAL CARE IF:   You do not recover as quickly as expected.  You develop new or worsening symptoms.  You have persistent pain, weakness, or nausea.  You recover and then have another episode of pain. SEEK IMMEDIATE MEDICAL CARE IF:   You are unable to eat or keep fluids down.  Your pain becomes severe.  You have a fever or persistent symptoms for more than 2 to 3 days.  You have a fever and your symptoms suddenly get worse.  Your skin or the white part of your eyes turn yellow (jaundice).  You develop vomiting.  You feel dizzy, or you faint.  Your blood sugar is high (over 300 mg/dL). MAKE SURE YOU:   Understand these instructions.  Will watch your condition.  Will get help right away if you are not doing well or get worse.   This information is not intended to replace advice given to you by your health care provider. Make sure you discuss any questions you have with your health care provider.   Document Released: 07/16/2005 Document Revised: 01/15/2012   Document Reviewed: 10/25/2011 Elsevier Interactive Patient Education [employer].     IF you received an x-ray today, you will receive an invoice from [facility]. Please contact [facility] at [phone] with questions or concerns regarding your invoice.   IF you received labwork today, you will receive an invoice from [employer] Please contact [facility] at [phone] with questions or concerns regarding your invoice.   Our billing staff will not be able to assist you with questions regarding bills from these companies.  You will be contacted with the lab results as soon as they are available. The fastest way to get your results is to activate your My Chart account. Instructions are located on the last page of this paperwork. If you have not heard from [place] regarding the results in 2 weeks, please contact this office.

## 2016-03-15 NOTE — ED Provider Notes (Signed)
[facility] DEPT Provider Note   CSN: [id number] Arrival date & time: 03/15/16  1051     History   Chief Complaint Chief Complaint  Patient presents with  . Pancreatitis    HPI Jacob Cannon is a 45 y.o. male.  HPI   45 year old male with history of hypertension, hyperlipidemia, GERD, hepatic steatosis here from urgent care for evaluation of abdominal pain. Patient report for the past 4 months he has had intermittent upper abdominal pain. He described pain as a sharp aching sensation. For the past 5 days his pain has intensified. States pain initially waxing waning and now has been coming more persistent. Pain does radiates to his right lower quadrant and towards his back. Rate pain as 9 out of 10. Report subjective fever several days prior but none since. Also endorsed mild nausea and has vomited twice in the past 5 days. Vomitus is nonbloody nonbilious. Also report having constipation for the past 5 days but able to produce a small bowel movement this a.m. Does report increasing pain with deep breathing but denies shortness of breath. Report occasional nonproductive cough. Report decrease in urination. Patient was seen at the urgent care yesterday for this complaint and subsequently had an abdominal and pelvis CT performed. He was notified today that he has pancreatitis and need to be transferred to [facility] for further management. The patient does not have a history of diabetes, denies alcohol abuse, and denies any recent medication changes. No significant history of cancer. Patient admits to unexplained weight gain within the past several months around his abdomen, as well as occasional night sweats and occasional subjective fever and the same duration.    Past Medical History:  Diagnosis Date  . Arthritis    type?  . Cardiomegaly 2010   [facility]  . Chest pain Chronic  . GERD (gastroesophageal reflux disease)   . Heart murmur congenital  . Hepatic steatosis   .  Hyperlipidemia   . Hypertension   . OSA on CPAP 2010  . Psoriasis    used to see derm     Patient Active Problem List   Diagnosis Date Noted  . 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 Unspecified gastritis and gastroduodenitis without mention of hemorrhage 09/13/2011  . Abdominal pain, right upper quadrant 09/12/2011  . Chest pain 04/20/2011  . Cough 01/15/2011  . General medical examination 01/12/2011  . Hypertension   . Psoriasis   . Heart murmur   . OSA on CPAP   . Cardiomegaly     Past Surgical History:  Procedure Laterality Date  . CARDIAC CATHETERIZATION    . ESOPHAGOGASTRODUODENOSCOPY  09/13/2011   Procedure: ESOPHAGOGASTRODUODENOSCOPY (EGD);  Surgeon: Estanislado Emms., MD,FACG;  Location: [facility];  Service: Endoscopy;  Laterality: N/A;  . HERNIA REPAIR     R inguinal       Home Medications    Prior to Admission medications   Medication Sig Start Date End Date Taking? Authorizing Provider  aspirin-sod bicarb-citric acid (ALKA-SELTZER) 325 MG TBEF tablet Take 325 mg by mouth every 6 (six) hours as needed (DISCOMFORT).   Yes Historical Provider, MD  lisinopril (PRINIVIL,ZESTRIL) 40 MG tablet TAKE 1 TABLET BY MOUTH DAILY NEED TO MAKE APPOINTMENT FOR FUTURE REFILLS Patient taking differently: TAKE 40 MG BY MOUTH DAILY NEED TO MAKE APPOINTMENT FOR FUTURE REFILLS 03/09/15  Yes Lelon Perla, MD  triamcinolone cream (KENALOG) 0.5 % Apply 1 application topically 2 (two) times daily. Patient not taking: Reported on 03/15/2016 03/14/16  03/21/16  Jaynee Eagles, PA-C    Family History Family History  Problem Relation Age of Onset  . Multiple sclerosis Mother   . Heart disease      GM, father , brother (early age)  . Diabetes      father   . Colon cancer Neg Hx   . Prostate cancer Neg Hx     Social History Social History  Substance Use Topics  . Smoking status: Former Smoker    Quit date: 04/19/2001  . Smokeless tobacco: Never Used  . Alcohol use Yes     Comment:  wine- occasionally     Allergies   Review of patient's allergies indicates no known allergies.   Review of Systems Review of Systems  All other systems reviewed and are negative.    Physical Exam Updated Vital Signs BP 140/78 (BP Location: Right Arm)   Pulse 67   Temp 98.3 F (36.8 C) (Oral)   Resp 18   SpO2 100%   Physical Exam  Constitutional: He appears well-developed and well-nourished. No distress.  Well appearing male laying in bed in no acute discomfort.  HENT:  Head: Atraumatic.  Eyes: Conjunctivae are normal.  Neck: Normal range of motion. Neck supple.  Cardiovascular: Normal rate and regular rhythm.   Abdominal: He exhibits distension.  Distended abdomen with diffuse tenderness most significant to upper abdomen. Negative Murphy sign, no pain at McBurney's point. Bowel sounds active.  Neurological: He is alert.  Skin: No rash noted.  Psychiatric: He has a normal mood and affect.  Nursing note and vitals reviewed.    ED Treatments / Results  Labs (all labs ordered are listed, but only abnormal results are displayed) Labs Reviewed  COMPREHENSIVE METABOLIC PANEL - Abnormal; Notable for the following:       Result Value   Calcium 8.3 (*)    All other components within normal limits  LIPASE, BLOOD  CBC WITH DIFFERENTIAL/PLATELET  LACTIC ACID, PLASMA  LACTIC ACID, PLASMA    EKG  EKG Interpretation  Date/Time:  Thursday March 15 2016 12:00:00 EDT Ventricular Rate:  73 PR Interval:    QRS Duration: 98 QT Interval:  441 QTC Calculation: 486 R Axis:   95 Text Interpretation:  Sinus rhythm Consider RVH w/ secondary repol abnormality Probable anteroseptal infarct, old Abnormal T, consider ischemia, lateral leads No significant change since last tracing Confirmed by Wilson Singer  MD, STEPHEN ([id number]) on 03/15/2016 12:08:48 PM       Radiology Ct Abdomen Pelvis W Contrast  Result Date: 03/14/2016 CLINICAL DATA:  Generalized abdominal pain with nausea vomiting  and abdominal distention for 1 week. EXAM: CT ABDOMEN AND PELVIS WITH CONTRAST TECHNIQUE: Multidetector CT imaging of the abdomen and pelvis was performed using the standard protocol following bolus administration of intravenous contrast. CONTRAST:  113mL ISOVUE-300 IOPAMIDOL (ISOVUE-300) INJECTION 61% COMPARISON:  None. FINDINGS: Lower chest:  Unremarkable Hepatobiliary: Scattered tiny hypodensities scattered in the liver parenchyma are too small to characterize but likely represent tiny cysts. There is no evidence for gallstones, gallbladder wall thickening, or pericholecystic fluid. No intrahepatic or extrahepatic biliary dilation. Pancreas: There is some mild fullness in the head of the pancreas with subtle peripancreatic edema in the region the pancreatic head (see axial image 20 of series 2 and sagittal image 92 of series 6). No dilatation of the main pancreatic duct. Spleen: No splenomegaly. No focal mass lesion. Adrenals/Urinary Tract: No adrenal nodule or mass. Small hypodensities in the kidneys bilaterally cannot be fully characterized but  likely represent cysts. A 16 mm low-density lesion in the upper pole the left kidney has an apparent enhancing nodule along its medial border (image 9 series 7). No evidence for hydroureter. The urinary bladder appears normal for the degree of distention. Stomach/Bowel: Stomach is nondistended. No gastric wall thickening. No evidence of outlet obstruction. Duodenum is normally positioned as is the ligament of Treitz. No small bowel wall thickening. No small bowel dilatation. The terminal ileum is normal. The appendix is normal. No gross colonic mass. No colonic wall thickening. No substantial diverticular change. Vascular/Lymphatic: No abdominal aortic aneurysm. There is no gastrohepatic or hepatoduodenal ligament lymphadenopathy. No intraperitoneal or retroperitoneal lymphadenopathy. Small external iliac lymph nodes are seen along both pelvic sidewalls, but no pelvic  lymphadenopathy is evident. Reproductive: The prostate gland and seminal vesicles have normal imaging features. Other: Trace free fluid is identified in the anatomic pelvis. Musculoskeletal: Bone windows reveal no worrisome lytic or sclerotic osseous lesions. IMPRESSION: 1. Fullness and subtle heterogeneity identified in the head of the pancreas with associated subtle peripancreatic edema/inflammation in the same region. Imaging features suggest focal pancreatitis. Given the normal appearance of the duodenum without wall thickening, duodenitis is considered less likely. Given patient age, mass lesion is also considered they less likely possibility. Follow-up MRI without and with contrast recommended to further evaluate. Electronically Signed   By: Misty Stanley M.D.   On: 03/14/2016 21:44   Dg Abd 2 Views  Result Date: 03/14/2016 CLINICAL DATA:  Two month history of upper abdominal pain. Recent constipation. Vomiting. EXAM: ABDOMEN - 2 VIEW COMPARISON:  None. FINDINGS: Bowel gas pattern does not show evidence of ileus, obstruction or free air. There are some air-fluid levels in the colon as might be seen with liquid stool or enemas. Small bowel pattern is normal. No abnormal calcifications or bone findings. IMPRESSION: Negative radiographs. Small air-fluid levels in the colon could be seen with liquid stool or recent enemas. Electronically Signed   By: Nelson Chimes M.D.   On: 03/14/2016 17:14    Procedures Procedures (including critical care time)  Medications Ordered in ED Medications  morphine 4 MG/ML injection 6 mg (6 mg Intravenous Given 03/15/16 1254)  ondansetron (ZOFRAN) injection 4 mg (4 mg Intravenous Given 03/15/16 1254)     Initial Impression / Assessment and Plan / ED Course  I have reviewed the triage vital signs and the nursing notes.  Pertinent labs & imaging results that were available during my care of the patient were reviewed by me and considered in my medical decision making  (see chart for details).  Clinical Course    BP 139/88   Pulse 80   Temp 98.3 F (36.8 C) (Oral)   Resp 13   SpO2 98%    Final Clinical Impressions(s) / ED Diagnoses   Final diagnoses:  Upper abdominal pain  Idiopathic acute pancreatitis without infection or necrosis    New Prescriptions New Prescriptions   No medications on file   11:30 AM Patient sent here from urgent care for further management of acute pancreatitis demonstrated on an abdominal and pelvis CT scan performed yesterday. Findings shows subtle peri-pancreatitis, although malignancy has not been completely rule out. Patient does not have any significant risk factor for pancreatitis. There is concern for potential malignancy. Will provide symptomatically treatment and admit patient for further care.  1:16 PM Labs are reassuring.  I have consulted [facility] and spoke with DR. Tammi Klippel who agrees to admit pt for acute pancreatitis.  Pt will  likely need GI involvement for further evaluation of his condition and to r/o malignancy.     Domenic Moras, PA-C 03/15/16 [place], MD 03/18/16 [phone]

## 2016-03-15 NOTE — H&P (Signed)
History and Physical    Jacob Cannon [id number] DOB: [birth year] DOA: 03/15/2016  PCP: Kathlene November, MD  Patient coming from: home  Chief Complaint: abd pain  HPI: Jacob Cannon is a 45 y.o. male with medical history significant of hypertension that comes into the hospital for abdominal pain nausea vomiting, he relates that 1 week prior to admission he went to [facility] where they diagnosed with pneumonia and started him on treatment with azithromycin, 3 days prior to admission he went to urgent care where he had a CT scan of the abdomen and pelvis and it showed inflammation of the pancreas  as he was thought he was he was given a manageable progressively got worse with nausea and vomiting not able to tolerate anything and pain radiating to his back. He relates no other new medications aside azithromycin, no alcohol consumption over the last week.  ED Course: In the ED lipase was done which is normal we're consulted for further evaluation.  Review of Systems: As per HPI otherwise 10 point review of systems negative.    Past Medical History:  Diagnosis Date  . Arthritis    type?  . Cardiomegaly 2010   [facility]  . Chest pain Chronic  . GERD (gastroesophageal reflux disease)   . Heart murmur congenital  . Hepatic steatosis   . Hyperlipidemia   . Hypertension   . OSA on CPAP 2010  . Psoriasis    used to see derm     Past Surgical History:  Procedure Laterality Date  . CARDIAC CATHETERIZATION    . ESOPHAGOGASTRODUODENOSCOPY  09/13/2011   Procedure: ESOPHAGOGASTRODUODENOSCOPY (EGD);  Surgeon: Estanislado Emms., MD,FACG;  Location: [facility];  Service: Endoscopy;  Laterality: N/A;  . HERNIA REPAIR     R inguinal     reports that he quit smoking about 14 years ago. His smoking use included Cigars. He smoked 2.00 packs per day. He has never used smokeless tobacco. He reports that he drinks alcohol. He reports that he does not use drugs.  No Known  Allergies  Family History  Problem Relation Age of Onset  . Multiple sclerosis Mother   . Heart attack Father   . Heart disease      GM, father , brother (early age)  . Diabetes      father   . Colon cancer Neg Hx   . Prostate cancer Neg Hx     Prior to Admission medications   Medication Sig Start Date End Date Taking? Authorizing Provider  aspirin-sod bicarb-citric acid (ALKA-SELTZER) 325 MG TBEF tablet Take 325 mg by mouth every 6 (six) hours as needed (DISCOMFORT).   Yes Historical Provider, MD  lisinopril (PRINIVIL,ZESTRIL) 40 MG tablet TAKE 1 TABLET BY MOUTH DAILY NEED TO MAKE APPOINTMENT FOR FUTURE REFILLS Patient taking differently: TAKE 40 MG BY MOUTH DAILY NEED TO MAKE APPOINTMENT FOR FUTURE REFILLS 03/09/15  Yes Lelon Perla, MD  triamcinolone cream (KENALOG) 0.5 % Apply 1 application topically 2 (two) times daily. Patient not taking: Reported on 03/15/2016 03/14/16 03/21/16  Jaynee Eagles, PA-C    Physical Exam: Vitals:   03/15/16 1200 03/15/16 1230 03/15/16 1300 03/15/16 1330  BP: 151/88 140/87 139/88 139/80  Pulse: 75 69 80 73  Resp: 20 20 13 23   Temp:      TempSrc:      SpO2: 99% 99% 98% 97%      Constitutional: NAD, calm, comfortable Vitals:   03/15/16 1200 03/15/16 1230 03/15/16 1300  03/15/16 1330  BP: 151/88 140/87 139/88 139/80  Pulse: 75 69 80 73  Resp: 20 20 13 23   Temp:      TempSrc:      SpO2: 99% 99% 98% 97%   Eyes: PERRL, lids and conjunctivae normal ENMT: Mucous membranes are moist. Posterior pharynx clear of any exudate or lesions.Normal dentition.  Neck: normal, supple, no masses, no thyromegaly Respiratory: clear to auscultation bilaterally, no wheezing, no crackles. Normal respiratory effort. No accessory muscle use.  Cardiovascular: Regular rate and rhythm, no murmurs / rubs / gallops. No extremity edema. 2+ pedal pulses. No carotid bruits.  Abdomen: Epigastric tenderness, no palpable masses, distended abdomen positive bowel sounds  soft Musculoskeletal: no clubbing / cyanosis. No joint deformity upper and lower extremities. Good ROM, no contractures. Normal muscle tone.  Skin: Psoriatic changes. Neurologic: CN 2-12 grossly intact. Sensation intact, DTR normal. Strength 5/5 in all 4.  Psychiatric: Normal judgment and insight. Alert and oriented x 3. Normal mood.     Labs on Admission: I have personally reviewed following labs and imaging studies  CBC:  Recent Labs Lab 03/15/16 0929 03/15/16 1204  WBC 5.8 5.5  NEUTROABS  --  2.6  HGB 17.9 16.2  HCT 49.5 47.9  MCV 84.5 87.2  PLT  --  [id number]   Basic Metabolic Panel:  Recent Labs Lab 03/15/16 0923 03/15/16 1204  NA 135 135  K 4.5 4.6  CL 99 103  CO2 27 25  GLUCOSE 84 85  BUN 13 12  CREATININE 1.18 1.14  CALCIUM 9.5 8.3*   GFR: Estimated Creatinine Clearance: 86.8 mL/min (by C-G formula based on SCr of 1.14 mg/dL). Liver Function Tests:  Recent Labs Lab 03/15/16 0923 03/15/16 1204  AST 27 24  ALT 23 23  ALKPHOS 70 64  BILITOT 0.9 1.1  PROT 7.7 7.4  ALBUMIN 4.8 4.2    Recent Labs Lab 03/15/16 0923 03/15/16 1204  LIPASE 47 44   No results for input(s): AMMONIA in the last 168 hours. Coagulation Profile: No results for input(s): INR, PROTIME in the last 168 hours. Cardiac Enzymes: No results for input(s): CKTOTAL, CKMB, CKMBINDEX, TROPONINI in the last 168 hours. BNP (last 3 results) No results for input(s): PROBNP in the last 8760 hours. HbA1C: No results for input(s): HGBA1C in the last 72 hours. CBG: No results for input(s): GLUCAP in the last 168 hours. Lipid Profile: No results for input(s): CHOL, HDL, LDLCALC, TRIG, CHOLHDL, LDLDIRECT in the last 72 hours. Thyroid Function Tests: No results for input(s): TSH, T4TOTAL, FREET4, T3FREE, THYROIDAB in the last 72 hours. Anemia Panel: No results for input(s): VITAMINB12, FOLATE, FERRITIN, TIBC, IRON, RETICCTPCT in the last 72 hours. Urine analysis:    Component Value Date/Time    COLORURINE YELLOW 09/11/2011 1125   APPEARANCEUR CLEAR 09/11/2011 1125   LABSPEC 1.015 09/11/2011 1125   PHURINE 7.5 09/11/2011 1125   GLUCOSEU NEGATIVE 09/11/2011 1125   HGBUR NEGATIVE 09/11/2011 1125   BILIRUBINUR neg 10/30/2013 1653   KETONESUR NEGATIVE 09/11/2011 1125   PROTEINUR 100 10/30/2013 1653   PROTEINUR NEGATIVE 09/11/2011 1125   UROBILINOGEN 1.0 10/30/2013 1653   UROBILINOGEN 0.2 09/11/2011 1125   NITRITE neg 10/30/2013 1653   NITRITE NEGATIVE 09/11/2011 1125   LEUKOCYTESUR Negative 10/30/2013 1653   Sepsis Labs: !!!!!!!!!!!!!!!!!!!!!!!!!!!!!!!!!!!!!!!!!!!! @LABRCNTIP (procalcitonin:4,lacticidven:4) )No results found for this or any previous visit (from the past 240 hour(s)).   Radiological Exams on Admission: Ct Abdomen Pelvis W Contrast  Result Date: 03/14/2016 CLINICAL DATA:  Generalized abdominal  pain with nausea vomiting and abdominal distention for 1 week. EXAM: CT ABDOMEN AND PELVIS WITH CONTRAST TECHNIQUE: Multidetector CT imaging of the abdomen and pelvis was performed using the standard protocol following bolus administration of intravenous contrast. CONTRAST:  154mL ISOVUE-300 IOPAMIDOL (ISOVUE-300) INJECTION 61% COMPARISON:  None. FINDINGS: Lower chest:  Unremarkable Hepatobiliary: Scattered tiny hypodensities scattered in the liver parenchyma are too small to characterize but likely represent tiny cysts. There is no evidence for gallstones, gallbladder wall thickening, or pericholecystic fluid. No intrahepatic or extrahepatic biliary dilation. Pancreas: There is some mild fullness in the head of the pancreas with subtle peripancreatic edema in the region the pancreatic head (see axial image 20 of series 2 and sagittal image 92 of series 6). No dilatation of the main pancreatic duct. Spleen: No splenomegaly. No focal mass lesion. Adrenals/Urinary Tract: No adrenal nodule or mass. Small hypodensities in the kidneys bilaterally cannot be fully characterized but likely  represent cysts. A 16 mm low-density lesion in the upper pole the left kidney has an apparent enhancing nodule along its medial border (image 9 series 7). No evidence for hydroureter. The urinary bladder appears normal for the degree of distention. Stomach/Bowel: Stomach is nondistended. No gastric wall thickening. No evidence of outlet obstruction. Duodenum is normally positioned as is the ligament of Treitz. No small bowel wall thickening. No small bowel dilatation. The terminal ileum is normal. The appendix is normal. No gross colonic mass. No colonic wall thickening. No substantial diverticular change. Vascular/Lymphatic: No abdominal aortic aneurysm. There is no gastrohepatic or hepatoduodenal ligament lymphadenopathy. No intraperitoneal or retroperitoneal lymphadenopathy. Small external iliac lymph nodes are seen along both pelvic sidewalls, but no pelvic lymphadenopathy is evident. Reproductive: The prostate gland and seminal vesicles have normal imaging features. Other: Trace free fluid is identified in the anatomic pelvis. Musculoskeletal: Bone windows reveal no worrisome lytic or sclerotic osseous lesions. IMPRESSION: 1. Fullness and subtle heterogeneity identified in the head of the pancreas with associated subtle peripancreatic edema/inflammation in the same region. Imaging features suggest focal pancreatitis. Given the normal appearance of the duodenum without wall thickening, duodenitis is considered less likely. Given patient age, mass lesion is also considered they less likely possibility. Follow-up MRI without and with contrast recommended to further evaluate. Electronically Signed   By: Misty Stanley M.D.   On: 03/14/2016 21:44   Dg Abd 2 Views  Result Date: 03/14/2016 CLINICAL DATA:  Two month history of upper abdominal pain. Recent constipation. Vomiting. EXAM: ABDOMEN - 2 VIEW COMPARISON:  None. FINDINGS: Bowel gas pattern does not show evidence of ileus, obstruction or free air. There are  some air-fluid levels in the colon as might be seen with liquid stool or enemas. Small bowel pattern is normal. No abnormal calcifications or bone findings. IMPRESSION: Negative radiographs. Small air-fluid levels in the colon could be seen with liquid stool or recent enemas. Electronically Signed   By: Nelson Chimes M.D.   On: 03/14/2016 17:14    EKG: Independently reviewed. None  Assessment/Plan Acute pancreatitis: I will place him nothing by mouth get a triglycerides, start him on IV fluids with potassium check a in the morning. No stones or bile dilation appeared on CT scan. Abdominal ultrasound to rule out stone. We'll start him on IV morphine for pain control. The second concern in the CT for possible mass and will get an MRCP. Unlikely azithromycin has done this, he has been on lisinopril for the last  5-10 years.  Essential hypertension Hold antihypertensive medication use  hydralazine when necessary.    DVT prophylaxis: lovenox  Code Status: full Family Communication: none Disposition Plan: when pain improved Consults called: None Admission status: Inpatient   Charlynne Cousins MD [facility] Pager [phone]  If 7PM-7AM, please contact night-coverage [email or web address] Password [email or web address]  03/15/2016, 2:06 PM

## 2016-03-15 NOTE — ED Triage Notes (Signed)
BIB [facility] from [facility]gent medical care c/o Rt to Mid abdominal pain since Sunday, was seen here in ER 8/16 and dx of Acute Pancreatitis, not getting any relief so pt went to urgent care this am, has RAC IV 20 gauge that was put in by Urgent care, NS KVO hanging, per Urgent care it was pt's 2nd bag of NS, tenderness to abdomen, no distention or ridgitity, no BM since Sunday, A&O, rates pain 9/10.

## 2016-03-16 LAB — BASIC METABOLIC PANEL
ANION GAP: 5 (ref 5–15)
BUN: 9 mg/dL (ref 6–20)
CALCIUM: 8.7 mg/dL — AB (ref 8.9–10.3)
CO2: 27 mmol/L (ref 22–32)
CREATININE: 1.07 mg/dL (ref 0.61–1.24)
Chloride: 103 mmol/L (ref 101–111)
Glucose, Bld: 98 mg/dL (ref 65–99)
Potassium: 4.7 mmol/L (ref 3.5–5.1)
SODIUM: 135 mmol/L (ref 135–145)

## 2016-03-16 LAB — LIPID PANEL
CHOLESTEROL: 184 mg/dL (ref 0–200)
HDL: 30 mg/dL — ABNORMAL LOW (ref >40)
LDL CALC: 131 mg/dL — AB (ref 0–99)
TRIGLYCERIDES: 114 mg/dL (ref <150)
Total CHOL/HDL Ratio: 6.1 RATIO
VLDL: 23 mg/dL (ref 0–40)

## 2016-03-16 MED ORDER — FAMOTIDINE IN NACL 20-0.9 MG/50ML-% IV SOLN
20.0000 mg | Freq: Once | INTRAVENOUS | Status: AC
  Administered 2016-03-16: 20 mg via INTRAVENOUS
  Filled 2016-03-16: qty 50

## 2016-03-16 NOTE — Progress Notes (Signed)
[facility] PROGRESS NOTE    Progress Note  Jacob Cannon  [id number] DOB: 1970-10-23 DOA: 03/15/2016 PCP: Kathlene November, MD     Brief Narrative:   Jacob Cannon is an 45 y.o. male history significant of hypertension that comes into the hospital for abdominal pain nausea vomiting, acute pancreatitis.  Assessment/Plan:   Active Problems:   Essential hypertension   Acute pancreatitis TG are 98, MRCP is pending.There has been minimal improvement in his symptomatology. Abdominal ultrasounds showed no stones no bile duct dilation, just a cyst left renal cortex measures 1.8 x 1.2 cm, poorly MRCP to see if it can contract her as this cyst. We'll keep the patient nothing by mouth, IV fluids and IV narcotics.   DVT prophylaxis: lovenox Family Communication:none Disposition Plan/Barrier to D/C: home in 1-2 days Code Status:     Code Status Orders        Start     Ordered   03/15/16 1436  Full code  Continuous     03/15/16 1435    Code Status History    Date Active Date Inactive Code Status Order ID Comments User Context   09/12/2011  1:24 AM 09/13/2011  8:20 PM Full Code [id number]  Theotis Barrio, RN ED        IV Access:    Peripheral IV   Procedures and diagnostic studies:   US Abdomen Complete  Result Date: 03/15/2016 CLINICAL DATA:  Pancreatitis, abdominal pain since July 13th. EXAM: ABDOMEN ULTRASOUND COMPLETE COMPARISON:  CT abdomen dated 03/14/2016. FINDINGS: Gallbladder: No gallstones or wall thickening visualized. No sonographic Murphy sign noted by sonographer. Common bile duct: Diameter: Normal at 4 mm Liver: Diffusely echogenic, compatible with fatty infiltration. No focal mass or lesion identified in the liver. IVC: No abnormality visualized. Pancreas: Visualized portion unremarkable.  Not well seen. Spleen: Size and appearance within normal limits. Right Kidney: Length: 10.1 cm. Echogenicity within normal limits. No mass or hydronephrosis  visualized. Left Kidney: Length: 11.3 cm. Echogenicity within normal limits. No suspicious mass or hydronephrosis visualized. Small cystic lesion within the left kidney, measuring 1.8 x 1.2 cm, also seen on earlier CT. Abdominal aorta: Proximal aorta normal in caliber at 2.4 cm. Mid and distal portions of the abdominal aorta are obscured by overlying bowel gas. Other findings: None. IMPRESSION: 1. On this ultrasound, the majority of the pancreas is obscured by overlying bowel gas. CT abdomen of 03/14/2016 described peripancreatic fluid suggesting focal pancreatitis. 2. Fatty infiltration of the liver. 3. No acute findings. 4. **An incidental finding of potential clinical significance has been found. Small cystic lesion within the left renal cortex measures 1.8 x 1.2 cm, too small to definitively characterize by ultrasound. CT report described a possible enhancing nodule along its medial aspect. Consider further characterization with renal MRI. ** Electronically Signed   By: Franki Cabot M.D.   On: 03/15/2016 16:58   Ct Abdomen Pelvis W Contrast  Result Date: 03/14/2016 CLINICAL DATA:  Generalized abdominal pain with nausea vomiting and abdominal distention for 1 week. EXAM: CT ABDOMEN AND PELVIS WITH CONTRAST TECHNIQUE: Multidetector CT imaging of the abdomen and pelvis was performed using the standard protocol following bolus administration of intravenous contrast. CONTRAST:  110mL ISOVUE-300 IOPAMIDOL (ISOVUE-300) INJECTION 61% COMPARISON:  None. FINDINGS: Lower chest:  Unremarkable Hepatobiliary: Scattered tiny hypodensities scattered in the liver parenchyma are too small to characterize but likely represent tiny cysts. There is no evidence for gallstones, gallbladder wall thickening, or pericholecystic fluid. No intrahepatic or  extrahepatic biliary dilation. Pancreas: There is some mild fullness in the head of the pancreas with subtle peripancreatic edema in the region the pancreatic head (see axial image  20 of series 2 and sagittal image 92 of series 6). No dilatation of the main pancreatic duct. Spleen: No splenomegaly. No focal mass lesion. Adrenals/Urinary Tract: No adrenal nodule or mass. Small hypodensities in the kidneys bilaterally cannot be fully characterized but likely represent cysts. A 16 mm low-density lesion in the upper pole the left kidney has an apparent enhancing nodule along its medial border (image 9 series 7). No evidence for hydroureter. The urinary bladder appears normal for the degree of distention. Stomach/Bowel: Stomach is nondistended. No gastric wall thickening. No evidence of outlet obstruction. Duodenum is normally positioned as is the ligament of Treitz. No small bowel wall thickening. No small bowel dilatation. The terminal ileum is normal. The appendix is normal. No gross colonic mass. No colonic wall thickening. No substantial diverticular change. Vascular/Lymphatic: No abdominal aortic aneurysm. There is no gastrohepatic or hepatoduodenal ligament lymphadenopathy. No intraperitoneal or retroperitoneal lymphadenopathy. Small external iliac lymph nodes are seen along both pelvic sidewalls, but no pelvic lymphadenopathy is evident. Reproductive: The prostate gland and seminal vesicles have normal imaging features. Other: Trace free fluid is identified in the anatomic pelvis. Musculoskeletal: Bone windows reveal no worrisome lytic or sclerotic osseous lesions. IMPRESSION: 1. Fullness and subtle heterogeneity identified in the head of the pancreas with associated subtle peripancreatic edema/inflammation in the same region. Imaging features suggest focal pancreatitis. Given the normal appearance of the duodenum without wall thickening, duodenitis is considered less likely. Given patient age, mass lesion is also considered they less likely possibility. Follow-up MRI without and with contrast recommended to further evaluate. Electronically Signed   By: Misty Stanley M.D.   On: 03/14/2016  21:44   Dg Abd 2 Views  Result Date: 03/14/2016 CLINICAL DATA:  Two month history of upper abdominal pain. Recent constipation. Vomiting. EXAM: ABDOMEN - 2 VIEW COMPARISON:  None. FINDINGS: Bowel gas pattern does not show evidence of ileus, obstruction or free air. There are some air-fluid levels in the colon as might be seen with liquid stool or enemas. Small bowel pattern is normal. No abnormal calcifications or bone findings. IMPRESSION: Negative radiographs. Small air-fluid levels in the colon could be seen with liquid stool or recent enemas. Electronically Signed   By: Nelson Chimes M.D.   On: 03/14/2016 17:14     Medical Consultants:    None.  Anti-Infectives:   None  Subjective:    [name] as he continues to have pain on Thursday or hungry. Continue his complaint of back pain.  Objective:    Vitals:   03/15/16 1430 03/15/16 1457 03/15/16 2138 03/16/16 0547  BP: 145/89 (!) 144/85 127/68 113/74  Pulse: 71 66 62 64  Resp: 18 16 18 18   Temp:  98.4 F (36.9 C) 98.2 F (36.8 C) 97.6 F (36.4 C)  TempSrc:  Oral Oral Oral  SpO2: 97% 98% 98% 95%  Weight: 94.8 kg (209 lb)     Height: 5\' 5"  (1.651 m) 5\' 5"  (1.651 m)      Intake/Output Summary (Last 24 hours) at 03/16/16 0742 Last data filed at 03/16/16 0600  Gross per 24 hour  Intake             1200 ml  Output                0 ml  Net  1200 ml   Filed Weights   03/15/16 1430  Weight: 94.8 kg (209 lb)    Exam: General exam: In no acute distress. Respiratory system: Good air movement and clear to auscultation. Cardiovascular system: S1 & S2 heard, RRR.  Gastrointestinal system: Abdomen is nondistended, Soft with epigastric tenderness.  Central nervous system: Alert and oriented. No focal neurological deficits. Extremities: No pedal edema. Skin: No rashes, lesions or ulcers Psychiatry: Judgement and insight appear normal. Mood & affect appropriate.    Data Reviewed:    Labs: Basic  Metabolic Panel:  Recent Labs Lab 03/15/16 0923 03/15/16 1204 03/16/16 0410  NA 135 135 135  K 4.5 4.6 4.7  CL 99 103 103  CO2 27 25 27   GLUCOSE 84 85 98  BUN 13 12 9   CREATININE 1.18 1.14 1.07  CALCIUM 9.5 8.3* 8.7*   GFR Estimated Creatinine Clearance: 92.2 mL/min (by C-G formula based on SCr of 1.07 mg/dL). Liver Function Tests:  Recent Labs Lab 03/15/16 0923 03/15/16 1204  AST 27 24  ALT 23 23  ALKPHOS 70 64  BILITOT 0.9 1.1  PROT 7.7 7.4  ALBUMIN 4.8 4.2    Recent Labs Lab 03/15/16 0923 03/15/16 1204  LIPASE 47 44   No results for input(s): AMMONIA in the last 168 hours. Coagulation profile No results for input(s): INR, PROTIME in the last 168 hours.  CBC:  Recent Labs Lab 03/14/16 1751 03/15/16 0929 03/15/16 1204  WBC 5.4 5.8 5.5  NEUTROABS  --   --  2.6  HGB 17.1 17.9 16.2  HCT 48.7 49.5 47.9  MCV 85.7 84.5 87.2  PLT 263  --  223   Cardiac Enzymes: No results for input(s): CKTOTAL, CKMB, CKMBINDEX, TROPONINI in the last 168 hours. BNP (last 3 results) No results for input(s): PROBNP in the last 8760 hours. CBG: No results for input(s): GLUCAP in the last 168 hours. D-Dimer: No results for input(s): DDIMER in the last 72 hours. Hgb A1c:  Recent Labs  03/14/16 1751  HGBA1C 5.8*   Lipid Profile:  Recent Labs  03/14/16 1751  CHOL 190  HDL 43  LDLCALC 129  TRIG 88  CHOLHDL 4.4   Thyroid function studies: No results for input(s): TSH, T4TOTAL, T3FREE, THYROIDAB in the last 72 hours.  Invalid input(s): FREET3 Anemia work up: No results for input(s): VITAMINB12, FOLATE, FERRITIN, TIBC, IRON, RETICCTPCT in the last 72 hours. Sepsis Labs:  Recent Labs Lab 03/14/16 1751 03/15/16 0929 03/15/16 1204  WBC 5.4 5.8 5.5  LATICACIDVEN  --   --  0.8   Microbiology No results found for this or any previous visit (from the past 240 hour(s)).   Medications:   . enoxaparin (LOVENOX) injection  40 mg Subcutaneous Q24H    Continuous Infusions: . dextrose 5 % and 0.45 % NaCl with KCl 10 mEq/L 100 mL/hr at 03/16/16 0600    Time spent: 25 min   LOS: 1 day   Charlynne Cousins  [facility] Pager [phone]  *Please refer to [email or web address], password [email or web address] to get updated schedule on who will round on this patient, as hospitalists switch teams weekly. If 7PM-7AM, please contact night-coverage at [email or web address], password [email or web address] for any overnight needs.  03/16/2016, 7:42 AM

## 2016-03-17 MED ORDER — LISINOPRIL 20 MG PO TABS
40.0000 mg | ORAL_TABLET | Freq: Every day | ORAL | Status: DC
  Administered 2016-03-17: 40 mg via ORAL
  Filled 2016-03-17: qty 2

## 2016-03-17 MED ORDER — SODIUM CHLORIDE 0.9 % IV SOLN
INTRAVENOUS | Status: AC
  Administered 2016-03-17: 11:00:00 via INTRAVENOUS

## 2016-03-17 MED ORDER — POLYETHYLENE GLYCOL 3350 17 G PO PACK
17.0000 g | PACK | Freq: Two times a day (BID) | ORAL | Status: DC
  Administered 2016-03-18 – 2016-03-19 (×3): 17 g via ORAL
  Filled 2016-03-17 (×3): qty 1

## 2016-03-17 MED ORDER — MORPHINE SULFATE (PF) 2 MG/ML IV SOLN
2.0000 mg | INTRAVENOUS | Status: DC | PRN
  Administered 2016-03-17 – 2016-03-18 (×7): 2 mg via INTRAVENOUS
  Filled 2016-03-17 (×7): qty 1

## 2016-03-17 MED ORDER — SODIUM CHLORIDE 0.9 % IV BOLUS (SEPSIS)
1000.0000 mL | Freq: Once | INTRAVENOUS | Status: AC
  Administered 2016-03-17: 1000 mL via INTRAVENOUS

## 2016-03-17 NOTE — Discharge Summary (Addendum)
Physician Discharge Summary  Jacob Cannon [id number] DOB: Jan 01, 1971 DOA: 03/15/2016  PCP: Kathlene November, MD  Admit date: 03/15/2016 Discharge date: 03/17/2016  Admitted From: home Disposition:  Home  Recommendations for Outpatient Follow-up:  1. Follow up with PCP in 1-2 weeks 2. Please obtain BMP/CBC in one week   Home Health:no Equipment/Devices:none  Discharge Condition:stable CODE STATUS:full Diet recommendation: Heart Healthy   Brief/Interim Summary: 45 year old with past nuchal history of hypertension that came into the hospital complaining of nausea vomiting and abdominal pain that radiated to his back, CT scan of the abdomen and pelvis was obtained that showed inflammation of the pancreas.  Discharge Diagnoses:  Active Problems:   Essential hypertension   Acute pancreatitis Was admitted to the hospital and treated conservatively with nothing by mouth, IV fluids and narcotics. His antihypertensive medications were held. His symptomatology improved slowly, his triglycerides were 98, MRCP showed no malignancy, abdominal ultrasound showed no stone or bile duct dilation, he had a bilateral renal cyst which were deemed benign by MRCP. Once he was able to tolerate his diet he was discharged home and will follow up with primary care doctor in 2 weeks.    Discharge Instructions  Discharge Instructions    Diet - low sodium heart healthy    Complete by:  As directed   Increase activity slowly    Complete by:  As directed       Medication List    TAKE these medications   aspirin-sod bicarb-citric acid 325 MG Tbef tablet Commonly known as:  ALKA-SELTZER Take 325 mg by mouth every 6 (six) hours as needed (DISCOMFORT).   lisinopril 40 MG tablet Commonly known as:  PRINIVIL,ZESTRIL TAKE 1 TABLET BY MOUTH DAILY NEED TO MAKE APPOINTMENT FOR FUTURE REFILLS What changed:  See the new instructions.   triamcinolone cream 0.5 % Commonly known as:  KENALOG Apply 1 application  topically 2 (two) times daily.       No Known Allergies  Consultations:  None   Procedures/Studies: US Abdomen Complete  Result Date: 03/15/2016 CLINICAL DATA:  Pancreatitis, abdominal pain since July 13th. EXAM: ABDOMEN ULTRASOUND COMPLETE COMPARISON:  CT abdomen dated 03/14/2016. FINDINGS: Gallbladder: No gallstones or wall thickening visualized. No sonographic Murphy sign noted by sonographer. Common bile duct: Diameter: Normal at 4 mm Liver: Diffusely echogenic, compatible with fatty infiltration. No focal mass or lesion identified in the liver. IVC: No abnormality visualized. Pancreas: Visualized portion unremarkable.  Not well seen. Spleen: Size and appearance within normal limits. Right Kidney: Length: 10.1 cm. Echogenicity within normal limits. No mass or hydronephrosis visualized. Left Kidney: Length: 11.3 cm. Echogenicity within normal limits. No suspicious mass or hydronephrosis visualized. Small cystic lesion within the left kidney, measuring 1.8 x 1.2 cm, also seen on earlier CT. Abdominal aorta: Proximal aorta normal in caliber at 2.4 cm. Mid and distal portions of the abdominal aorta are obscured by overlying bowel gas. Other findings: None. IMPRESSION: 1. On this ultrasound, the majority of the pancreas is obscured by overlying bowel gas. CT abdomen of 03/14/2016 described peripancreatic fluid suggesting focal pancreatitis. 2. Fatty infiltration of the liver. 3. No acute findings. 4. **An incidental finding of potential clinical significance has been found. Small cystic lesion within the left renal cortex measures 1.8 x 1.2 cm, too small to definitively characterize by ultrasound. CT report described a possible enhancing nodule along its medial aspect. Consider further characterization with renal MRI. ** Electronically Signed   By: Franki Cabot M.D.   On: 03/15/2016  16:58   Ct Abdomen Pelvis W Contrast  Result Date: 03/14/2016 CLINICAL DATA:  Generalized abdominal pain with nausea  vomiting and abdominal distention for 1 week. EXAM: CT ABDOMEN AND PELVIS WITH CONTRAST TECHNIQUE: Multidetector CT imaging of the abdomen and pelvis was performed using the standard protocol following bolus administration of intravenous contrast. CONTRAST:  126mL ISOVUE-300 IOPAMIDOL (ISOVUE-300) INJECTION 61% COMPARISON:  None. FINDINGS: Lower chest:  Unremarkable Hepatobiliary: Scattered tiny hypodensities scattered in the liver parenchyma are too small to characterize but likely represent tiny cysts. There is no evidence for gallstones, gallbladder wall thickening, or pericholecystic fluid. No intrahepatic or extrahepatic biliary dilation. Pancreas: There is some mild fullness in the head of the pancreas with subtle peripancreatic edema in the region the pancreatic head (see axial image 20 of series 2 and sagittal image 92 of series 6). No dilatation of the main pancreatic duct. Spleen: No splenomegaly. No focal mass lesion. Adrenals/Urinary Tract: No adrenal nodule or mass. Small hypodensities in the kidneys bilaterally cannot be fully characterized but likely represent cysts. A 16 mm low-density lesion in the upper pole the left kidney has an apparent enhancing nodule along its medial border (image 9 series 7). No evidence for hydroureter. The urinary bladder appears normal for the degree of distention. Stomach/Bowel: Stomach is nondistended. No gastric wall thickening. No evidence of outlet obstruction. Duodenum is normally positioned as is the ligament of Treitz. No small bowel wall thickening. No small bowel dilatation. The terminal ileum is normal. The appendix is normal. No gross colonic mass. No colonic wall thickening. No substantial diverticular change. Vascular/Lymphatic: No abdominal aortic aneurysm. There is no gastrohepatic or hepatoduodenal ligament lymphadenopathy. No intraperitoneal or retroperitoneal lymphadenopathy. Small external iliac lymph nodes are seen along both pelvic sidewalls, but no  pelvic lymphadenopathy is evident. Reproductive: The prostate gland and seminal vesicles have normal imaging features. Other: Trace free fluid is identified in the anatomic pelvis. Musculoskeletal: Bone windows reveal no worrisome lytic or sclerotic osseous lesions. IMPRESSION: 1. Fullness and subtle heterogeneity identified in the head of the pancreas with associated subtle peripancreatic edema/inflammation in the same region. Imaging features suggest focal pancreatitis. Given the normal appearance of the duodenum without wall thickening, duodenitis is considered less likely. Given patient age, mass lesion is also considered they less likely possibility. Follow-up MRI without and with contrast recommended to further evaluate. Electronically Signed   By: Misty Stanley M.D.   On: 03/14/2016 21:44   Mr 3d Recon At Scanner  Result Date: 03/16/2016 CLINICAL DATA:  Abdominal pain with nausea and vomiting for 1 week. Focal pancreatitis versus pancreatic mass and indeterminate left renal cystic lesion on recent CT. EXAM: MRI ABDOMEN WITHOUT AND WITH CONTRAST (INCLUDING MRCP) TECHNIQUE: Multiplanar multisequence MR imaging of the abdomen was performed both before and after the administration of intravenous contrast. Heavily T2-weighted images of the biliary and pancreatic ducts were obtained, and three-dimensional MRCP images were rendered by post processing. CONTRAST:  21mL MULTIHANCE GADOBENATE DIMEGLUMINE 529 MG/ML IV SOLN COMPARISON:  CT on 03/14/2016 FINDINGS: Lower chest:  No acute findings. Hepatobiliary: Geographic pattern of fatty infiltration is seen predominately involving the right hepatic lobe on chemical shift imaging. Probable tiny sub-cm cysts noted in the left hepatic lobe, which remain too small to characterize. No liver masses identified. Gallbladder is unremarkable. No evidence of biliary ductal dilatation or choledocholithiasis, with common bile duct measuring approximately 3 mm. Pancreas: A focal  area of pancreatic edema is seen involving the head and uncinate process, with normal  passage of pancreatic duct through this area. There is mild adjacent peripancreatic edema. This is consistent with focal pancreatitis. No pancreatic mass or necrosis visualized. No evidence of pancreatic pseudocyst formation. No evidence of pancreatic ductal dilatation or pancreas divisum. Spleen:  Within normal limits in size and appearance. Adrenals/Urinary Tract: No adrenal masses are identified. Several small benign-appearing renal cysts are noted bilaterally, however no complex cystic or solid masses are identified. No evidence of hydronephrosis. Stomach/Bowel: Visualized portions within the abdomen are unremarkable. Vascular/Lymphatic: No pathologically enlarged lymph nodes identified. No abdominal aortic aneurysm demonstrated. Other:  None. Musculoskeletal:  No suspicious bone lesions identified. IMPRESSION: Focal acute pancreatitis involving the head and uncinate process. No evidence of pancreatic neoplasm. No evidence of biliary or pancreatic ductal dilatation. Geographic pattern of hepatic steatosis, mainly involving the right hepatic lobe. No evidence of hepatic mass. Small benign appearing bilateral renal cysts. No evidence of renal neoplasm or hydronephrosis. Electronically Signed   By: Earle Gell M.D.   On: 03/16/2016 07:59   Dg Abd 2 Views  Result Date: 03/14/2016 CLINICAL DATA:  Two month history of upper abdominal pain. Recent constipation. Vomiting. EXAM: ABDOMEN - 2 VIEW COMPARISON:  None. FINDINGS: Bowel gas pattern does not show evidence of ileus, obstruction or free air. There are some air-fluid levels in the colon as might be seen with liquid stool or enemas. Small bowel pattern is normal. No abnormal calcifications or bone findings. IMPRESSION: Negative radiographs. Small air-fluid levels in the colon could be seen with liquid stool or recent enemas. Electronically Signed   By: Nelson Chimes M.D.   On:  03/14/2016 17:14   Mr Lambert Mody Cm/mrcp  Result Date: 03/16/2016 CLINICAL DATA:  Abdominal pain with nausea and vomiting for 1 week. Focal pancreatitis versus pancreatic mass and indeterminate left renal cystic lesion on recent CT. EXAM: MRI ABDOMEN WITHOUT AND WITH CONTRAST (INCLUDING MRCP) TECHNIQUE: Multiplanar multisequence MR imaging of the abdomen was performed both before and after the administration of intravenous contrast. Heavily T2-weighted images of the biliary and pancreatic ducts were obtained, and three-dimensional MRCP images were rendered by post processing. CONTRAST:  41mL MULTIHANCE GADOBENATE DIMEGLUMINE 529 MG/ML IV SOLN COMPARISON:  CT on 03/14/2016 FINDINGS: Lower chest:  No acute findings. Hepatobiliary: Geographic pattern of fatty infiltration is seen predominately involving the right hepatic lobe on chemical shift imaging. Probable tiny sub-cm cysts noted in the left hepatic lobe, which remain too small to characterize. No liver masses identified. Gallbladder is unremarkable. No evidence of biliary ductal dilatation or choledocholithiasis, with common bile duct measuring approximately 3 mm. Pancreas: A focal area of pancreatic edema is seen involving the head and uncinate process, with normal passage of pancreatic duct through this area. There is mild adjacent peripancreatic edema. This is consistent with focal pancreatitis. No pancreatic mass or necrosis visualized. No evidence of pancreatic pseudocyst formation. No evidence of pancreatic ductal dilatation or pancreas divisum. Spleen:  Within normal limits in size and appearance. Adrenals/Urinary Tract: No adrenal masses are identified. Several small benign-appearing renal cysts are noted bilaterally, however no complex cystic or solid masses are identified. No evidence of hydronephrosis. Stomach/Bowel: Visualized portions within the abdomen are unremarkable. Vascular/Lymphatic: No pathologically enlarged lymph nodes identified. No  abdominal aortic aneurysm demonstrated. Other:  None. Musculoskeletal:  No suspicious bone lesions identified. IMPRESSION: Focal acute pancreatitis involving the head and uncinate process. No evidence of pancreatic neoplasm. No evidence of biliary or pancreatic ductal dilatation. Geographic pattern of hepatic steatosis, mainly involving the right hepatic  lobe. No evidence of hepatic mass. Small benign appearing bilateral renal cysts. No evidence of renal neoplasm or hydronephrosis. Electronically Signed   By: Earle Gell M.D.   On: 03/16/2016 07:59      Subjective:   Discharge Exam: Vitals:   03/16/16 2317 03/17/16 0625  BP: 125/83 109/60  Pulse: 60 60  Resp: 18 18  Temp: 97.9 F (36.6 C) 97.5 F (36.4 C)   Vitals:   03/16/16 1416 03/16/16 1724 03/16/16 2317 03/17/16 0625  BP: (!) 137/92 (!) 150/90 125/83 109/60  Pulse: 60 62 60 60  Resp: 16 18 18 18   Temp: 98.1 F (36.7 C) 97.7 F (36.5 C) 97.9 F (36.6 C) 97.5 F (36.4 C)  TempSrc: Oral Oral Oral Oral  SpO2: 98% 100% 100% 100%  Weight:      Height:        General: Pt is alert, awake, not in acute distress Cardiovascular: RRR, S1/S2 +, no rubs, no gallops Respiratory: CTA bilaterally, no wheezing, no rhonchi Abdominal: Soft, NT, ND, bowel sounds + Extremities: no edema, no cyanosis    The results of significant diagnostics from this hospitalization (including imaging, microbiology, ancillary and laboratory) are listed below for reference.     Microbiology: No results found for this or any previous visit (from the past 240 hour(s)).   Labs: BNP (last 3 results) No results for input(s): BNP in the last 8760 hours. Basic Metabolic Panel:  Recent Labs Lab 03/15/16 0923 03/15/16 1204 03/16/16 0410  NA 135 135 135  K 4.5 4.6 4.7  CL 99 103 103  CO2 27 25 27   GLUCOSE 84 85 98  BUN 13 12 9   CREATININE 1.18 1.14 1.07  CALCIUM 9.5 8.3* 8.7*   Liver Function Tests:  Recent Labs Lab 03/15/16 0923  03/15/16 1204  AST 27 24  ALT 23 23  ALKPHOS 70 64  BILITOT 0.9 1.1  PROT 7.7 7.4  ALBUMIN 4.8 4.2    Recent Labs Lab 03/15/16 0923 03/15/16 1204  LIPASE 47 44   No results for input(s): AMMONIA in the last 168 hours. CBC:  Recent Labs Lab 03/14/16 1751 03/15/16 0929 03/15/16 1204  WBC 5.4 5.8 5.5  NEUTROABS  --   --  2.6  HGB 17.1 17.9 16.2  HCT 48.7 49.5 47.9  MCV 85.7 84.5 87.2  PLT 263  --  223   Cardiac Enzymes: No results for input(s): CKTOTAL, CKMB, CKMBINDEX, TROPONINI in the last 168 hours. BNP: Invalid input(s): POCBNP CBG: No results for input(s): GLUCAP in the last 168 hours. D-Dimer No results for input(s): DDIMER in the last 72 hours. Hgb A1c  Recent Labs  03/14/16 1751  HGBA1C 5.8*   Lipid Profile  Recent Labs  03/14/16 1751 03/16/16 0410  CHOL 190 184  HDL 43 30*  LDLCALC 129 131*  TRIG 88 114  CHOLHDL 4.4 6.1   Thyroid function studies No results for input(s): TSH, T4TOTAL, T3FREE, THYROIDAB in the last 72 hours.  Invalid input(s): FREET3 Anemia work up No results for input(s): VITAMINB12, FOLATE, FERRITIN, TIBC, IRON, RETICCTPCT in the last 72 hours. Urinalysis    Component Value Date/Time   COLORURINE YELLOW 09/11/2011 1125   APPEARANCEUR CLEAR 09/11/2011 1125   LABSPEC 1.015 09/11/2011 1125   PHURINE 7.5 09/11/2011 1125   GLUCOSEU NEGATIVE 09/11/2011 1125   HGBUR NEGATIVE 09/11/2011 1125   BILIRUBINUR neg 10/30/2013 [place] 09/11/2011 1125   PROTEINUR 100 10/30/2013 1653   PROTEINUR NEGATIVE 09/11/2011  1125   UROBILINOGEN 1.0 10/30/2013 1653   UROBILINOGEN 0.2 09/11/2011 1125   NITRITE neg 10/30/2013 1653   NITRITE NEGATIVE 09/11/2011 1125   LEUKOCYTESUR Negative 10/30/2013 1653   Sepsis Labs Invalid input(s): PROCALCITONIN,  WBC,  LACTICIDVEN Microbiology No results found for this or any previous visit (from the past 240 hour(s)).   Time coordinating discharge: Over 30  minutes  SIGNED:   Charlynne Cousins, MD  [facility] 03/17/2016, 7:29 AM Pager   If 7PM-7AM, please contact night-coverage [email or web address] Password [email or web address]  Addendum: Patient was not discharge as he started having abd pain after lunch, place back on IV fluids and NPO.

## 2016-03-18 LAB — CBC
HCT: 48.8 % (ref 39.0–52.0)
Hemoglobin: 16.6 g/dL (ref 13.0–17.0)
MCH: 29.3 pg (ref 26.0–34.0)
MCHC: 34 g/dL (ref 30.0–36.0)
MCV: 86.2 fL (ref 78.0–100.0)
PLATELETS: 256 10*3/uL (ref 150–400)
RBC: 5.66 MIL/uL (ref 4.22–5.81)
RDW: 13 % (ref 11.5–15.5)
WBC: 5.3 10*3/uL (ref 4.0–10.5)

## 2016-03-18 MED ORDER — PANTOPRAZOLE SODIUM 40 MG PO TBEC
40.0000 mg | DELAYED_RELEASE_TABLET | Freq: Two times a day (BID) | ORAL | Status: DC
  Administered 2016-03-18 – 2016-03-19 (×3): 40 mg via ORAL
  Filled 2016-03-18 (×3): qty 1

## 2016-03-18 MED ORDER — DEXTROSE-NACL 5-0.45 % IV SOLN
INTRAVENOUS | Status: DC
  Administered 2016-03-18: 17:00:00 via INTRAVENOUS

## 2016-03-18 NOTE — Progress Notes (Signed)
Pt. bed elevated in high position. Pt. refuses to have bed placed in low position. Safety education provided. Pt. States he will lower bed prior to getting out of bed.

## 2016-03-18 NOTE — Progress Notes (Addendum)
[facility] PROGRESS NOTE    Progress Note  Jacob Cannon  [id number] DOB: 04-Jan-1971 DOA: 03/15/2016 PCP: Kathlene November, MD     Brief Narrative:   Jacob Cannon is an 45 y.o. male history significant of hypertension that comes into the hospital for abdominal pain nausea vomiting, acute pancreatitis.  Assessment/Plan:   Active Problems:   Essential hypertension   Acute pancreatitis Pain worsen with foods, had a large BM. Start protonix. We'll keep the patient nothing by mouth, IV fluids and IV narcotics.   DVT prophylaxis: lovenox Family Communication:none Disposition Plan/Barrier to D/C: home in 1-2 days Code Status:     Code Status Orders        Start     Ordered   03/15/16 1436  Full code  Continuous     03/15/16 1435    Code Status History    Date Active Date Inactive Code Status Order ID Comments User Context   09/12/2011  1:24 AM 09/13/2011  8:20 PM Full Code [id number]  Theotis Barrio, RN ED        IV Access:    Peripheral IV   Procedures and diagnostic studies:   No results found.   Medical Consultants:    None.  Anti-Infectives:   None  Subjective:    Jacob Cannon as he continues to have pain, started after eating.  Objective:    Vitals:   03/17/16 0625 03/17/16 1443 03/17/16 2033 03/18/16 0611  BP: 109/60 (!) 141/84 (!) 142/82 114/64  Pulse: 60 65 66 65  Resp: 18 18 18 18   Temp: 97.5 F (36.4 C) 97.6 F (36.4 C) 98.5 F (36.9 C) 97.7 F (36.5 C)  TempSrc: Oral Oral Oral Oral  SpO2: 100% 100% 100% 98%  Weight:      Height:        Intake/Output Summary (Last 24 hours) at 03/18/16 0715 Last data filed at 03/17/16 1500  Gross per 24 hour  Intake              605 ml  Output                0 ml  Net              605 ml   Filed Weights   03/15/16 1430  Weight: 94.8 kg (209 lb)    Exam: General exam: In no acute distress. Respiratory system: Good air movement and clear to  auscultation. Cardiovascular system: S1 & S2 heard, RRR.  Gastrointestinal system: Abdomen is nondistended, Soft, no tenderness to palpation Central nervous system: Alert and oriented. No focal neurological deficits. Extremities: No pedal edema. Skin: No rashes, lesions or ulcers Psychiatry: Judgement and insight appear normal. Mood & affect appropriate.    Data Reviewed:    Labs: Basic Metabolic Panel:  Recent Labs Lab 03/15/16 0923 03/15/16 1204 03/16/16 0410  NA 135 135 135  K 4.5 4.6 4.7  CL 99 103 103  CO2 27 25 27   GLUCOSE 84 85 98  BUN 13 12 9   CREATININE 1.18 1.14 1.07  CALCIUM 9.5 8.3* 8.7*   GFR Estimated Creatinine Clearance: 92.2 mL/min (by C-G formula based on SCr of 1.07 mg/dL). Liver Function Tests:  Recent Labs Lab 03/15/16 0923 03/15/16 1204  AST 27 24  ALT 23 23  ALKPHOS 70 64  BILITOT 0.9 1.1  PROT 7.7 7.4  ALBUMIN 4.8 4.2    Recent Labs Lab 03/15/16 0923 03/15/16 1204  LIPASE 47 44  No results for input(s): AMMONIA in the last 168 hours. Coagulation profile No results for input(s): INR, PROTIME in the last 168 hours.  CBC:  Recent Labs Lab 03/14/16 1751 03/15/16 0929 03/15/16 1204  WBC 5.4 5.8 5.5  NEUTROABS  --   --  2.6  HGB 17.1 17.9 16.2  HCT 48.7 49.5 47.9  MCV 85.7 84.5 87.2  PLT 263  --  223   Cardiac Enzymes: No results for input(s): CKTOTAL, CKMB, CKMBINDEX, TROPONINI in the last 168 hours. BNP (last 3 results) No results for input(s): PROBNP in the last 8760 hours. CBG: No results for input(s): GLUCAP in the last 168 hours. D-Dimer: No results for input(s): DDIMER in the last 72 hours. Hgb A1c: No results for input(s): HGBA1C in the last 72 hours. Lipid Profile:  Recent Labs  03/16/16 0410  CHOL 184  HDL 30*  LDLCALC 131*  TRIG 114  CHOLHDL 6.1   Thyroid function studies: No results for input(s): TSH, T4TOTAL, T3FREE, THYROIDAB in the last 72 hours.  Invalid input(s): FREET3 Anemia work  up: No results for input(s): VITAMINB12, FOLATE, FERRITIN, TIBC, IRON, RETICCTPCT in the last 72 hours. Sepsis Labs:  Recent Labs Lab 03/14/16 1751 03/15/16 0929 03/15/16 1204  WBC 5.4 5.8 5.5  LATICACIDVEN  --   --  0.8   Microbiology No results found for this or any previous visit (from the past 240 hour(s)).   Medications:   . pantoprazole  40 mg Oral BID  . polyethylene glycol  17 g Oral BID   Continuous Infusions: . sodium chloride 100 mL/hr at 03/17/16 1121    Time spent: 15 min   LOS: 3 days   Charlynne Cousins  [facility] Pager [phone]  *Please refer to [email or web address], password [email or web address] to get updated schedule on who will round on this patient, as hospitalists switch teams weekly. If 7PM-7AM, please contact night-coverage at [email or web address], password [email or web address] for any overnight needs.  03/18/2016, 7:15 AM

## 2016-03-19 ENCOUNTER — Other Ambulatory Visit: Payer: Self-pay | Admitting: Cardiology

## 2016-03-19 LAB — BASIC METABOLIC PANEL
ANION GAP: 6 (ref 5–15)
BUN: 10 mg/dL (ref 6–20)
CHLORIDE: 103 mmol/L (ref 101–111)
CO2: 29 mmol/L (ref 22–32)
Calcium: 9.2 mg/dL (ref 8.9–10.3)
Creatinine, Ser: 1.22 mg/dL (ref 0.61–1.24)
GFR calc Af Amer: >60 mL/min (ref >60)
GFR calc non Af Amer: >60 mL/min (ref >60)
GLUCOSE: 90 mg/dL (ref 65–99)
POTASSIUM: 4.5 mmol/L (ref 3.5–5.1)
Sodium: 138 mmol/L (ref 135–145)

## 2016-03-19 LAB — CBC
HEMATOCRIT: 47.3 % (ref 39.0–52.0)
HEMOGLOBIN: 16.6 g/dL (ref 13.0–17.0)
MCH: 29.6 pg (ref 26.0–34.0)
MCHC: 35.1 g/dL (ref 30.0–36.0)
MCV: 84.5 fL (ref 78.0–100.0)
Platelets: 241 10*3/uL (ref 150–400)
RBC: 5.6 MIL/uL (ref 4.22–5.81)
RDW: 12.8 % (ref 11.5–15.5)
WBC: 4.6 10*3/uL (ref 4.0–10.5)

## 2016-03-19 MED ORDER — PANTOPRAZOLE SODIUM 40 MG PO TBEC: 40.0000 mg | DELAYED_RELEASE_TABLET | Freq: Two times a day (BID) | ORAL | 0 refills | Status: DC

## 2016-03-19 NOTE — Discharge Summary (Signed)
Physician Discharge Summary  Jacob Cannon [id number] DOB: 30-Sep-1970 DOA: 03/15/2016  PCP: No primary care provider on file.  Admit date: 03/15/2016 Discharge date: 03/19/2016  Admitted From: home Disposition:  Home  Recommendations for Outpatient Follow-up:  1. Follow up with PCP in 1-2 weeks 2. Please obtain BMP/CBC in one week 3. Please follow up on the following pending results:  Home Health:no (YES/NO) Equipment/Devices:none  Discharge Condition:stable CODE STATUS:full Diet recommendation: Heart Healthy  Brief/Interim Summary: 45 year old with past medical history of hypertension comes in complaining of abdominal pain nausea and vomiting.  Discharge Diagnoses:  Acute pancreatitis: Possibly related to alcohol as 10 days prior to his visit to the ED he had been on vacation and admits to binge drinking. Triglycerides were 98, CT scan abdominal ultrasound did not show any stone or biliary disease. He was conservatively with IV fluid nothing by mouth nothing by mouth after 5 days his pain subside. He was tried on a regular diet which he tolerated and he was discharged home.  Essential hypertension: No changes related to his medication.   Discharge Instructions  Discharge Instructions    Diet - low sodium heart healthy    Complete by:  As directed   Diet - low sodium heart healthy    Complete by:  As directed   Increase activity slowly    Complete by:  As directed   Increase activity slowly    Complete by:  As directed       Medication List    TAKE these medications   aspirin-sod bicarb-citric acid 325 MG Tbef tablet Commonly known as:  ALKA-SELTZER Take 325 mg by mouth every 6 (six) hours as needed (DISCOMFORT).   lisinopril 40 MG tablet Commonly known as:  PRINIVIL,ZESTRIL TAKE 1 TABLET BY MOUTH DAILY NEED TO MAKE APPOINTMENT FOR FUTURE REFILLS What changed:  See the new instructions.   pantoprazole 40 MG tablet Commonly known as:  PROTONIX Take 1  tablet (40 mg total) by mouth 2 (two) times daily.   triamcinolone cream 0.5 % Commonly known as:  KENALOG Apply 1 application topically 2 (two) times daily.       No Known Allergies  Consultations:  None   Procedures/Studies: US Abdomen Complete  Result Date: 03/15/2016 CLINICAL DATA:  Pancreatitis, abdominal pain since July 13th. EXAM: ABDOMEN ULTRASOUND COMPLETE COMPARISON:  CT abdomen dated 03/14/2016. FINDINGS: Gallbladder: No gallstones or wall thickening visualized. No sonographic Murphy sign noted by sonographer. Common bile duct: Diameter: Normal at 4 mm Liver: Diffusely echogenic, compatible with fatty infiltration. No focal mass or lesion identified in the liver. IVC: No abnormality visualized. Pancreas: Visualized portion unremarkable.  Not well seen. Spleen: Size and appearance within normal limits. Right Kidney: Length: 10.1 cm. Echogenicity within normal limits. No mass or hydronephrosis visualized. Left Kidney: Length: 11.3 cm. Echogenicity within normal limits. No suspicious mass or hydronephrosis visualized. Small cystic lesion within the left kidney, measuring 1.8 x 1.2 cm, also seen on earlier CT. Abdominal aorta: Proximal aorta normal in caliber at 2.4 cm. Mid and distal portions of the abdominal aorta are obscured by overlying bowel gas. Other findings: None. IMPRESSION: 1. On this ultrasound, the majority of the pancreas is obscured by overlying bowel gas. CT abdomen of 03/14/2016 described peripancreatic fluid suggesting focal pancreatitis. 2. Fatty infiltration of the liver. 3. No acute findings. 4. **An incidental finding of potential clinical significance has been found. Small cystic lesion within the left renal cortex measures 1.8 x 1.2 cm, too small to  definitively characterize by ultrasound. CT report described a possible enhancing nodule along its medial aspect. Consider further characterization with renal MRI. ** Electronically Signed   By: Franki Cabot M.D.   On:  03/15/2016 16:58   Ct Abdomen Pelvis W Contrast  Result Date: 03/14/2016 CLINICAL DATA:  Generalized abdominal pain with nausea vomiting and abdominal distention for 1 week. EXAM: CT ABDOMEN AND PELVIS WITH CONTRAST TECHNIQUE: Multidetector CT imaging of the abdomen and pelvis was performed using the standard protocol following bolus administration of intravenous contrast. CONTRAST:  136mL ISOVUE-300 IOPAMIDOL (ISOVUE-300) INJECTION 61% COMPARISON:  None. FINDINGS: Lower chest:  Unremarkable Hepatobiliary: Scattered tiny hypodensities scattered in the liver parenchyma are too small to characterize but likely represent tiny cysts. There is no evidence for gallstones, gallbladder wall thickening, or pericholecystic fluid. No intrahepatic or extrahepatic biliary dilation. Pancreas: There is some mild fullness in the head of the pancreas with subtle peripancreatic edema in the region the pancreatic head (see axial image 20 of series 2 and sagittal image 92 of series 6). No dilatation of the main pancreatic duct. Spleen: No splenomegaly. No focal mass lesion. Adrenals/Urinary Tract: No adrenal nodule or mass. Small hypodensities in the kidneys bilaterally cannot be fully characterized but likely represent cysts. A 16 mm low-density lesion in the upper pole the left kidney has an apparent enhancing nodule along its medial border (image 9 series 7). No evidence for hydroureter. The urinary bladder appears normal for the degree of distention. Stomach/Bowel: Stomach is nondistended. No gastric wall thickening. No evidence of outlet obstruction. Duodenum is normally positioned as is the ligament of Treitz. No small bowel wall thickening. No small bowel dilatation. The terminal ileum is normal. The appendix is normal. No gross colonic mass. No colonic wall thickening. No substantial diverticular change. Vascular/Lymphatic: No abdominal aortic aneurysm. There is no gastrohepatic or hepatoduodenal ligament lymphadenopathy.  No intraperitoneal or retroperitoneal lymphadenopathy. Small external iliac lymph nodes are seen along both pelvic sidewalls, but no pelvic lymphadenopathy is evident. Reproductive: The prostate gland and seminal vesicles have normal imaging features. Other: Trace free fluid is identified in the anatomic pelvis. Musculoskeletal: Bone windows reveal no worrisome lytic or sclerotic osseous lesions. IMPRESSION: 1. Fullness and subtle heterogeneity identified in the head of the pancreas with associated subtle peripancreatic edema/inflammation in the same region. Imaging features suggest focal pancreatitis. Given the normal appearance of the duodenum without wall thickening, duodenitis is considered less likely. Given patient age, mass lesion is also considered they less likely possibility. Follow-up MRI without and with contrast recommended to further evaluate. Electronically Signed   By: Misty Stanley M.D.   On: 03/14/2016 21:44   Mr 3d Recon At Scanner  Result Date: 03/16/2016 CLINICAL DATA:  Abdominal pain with nausea and vomiting for 1 week. Focal pancreatitis versus pancreatic mass and indeterminate left renal cystic lesion on recent CT. EXAM: MRI ABDOMEN WITHOUT AND WITH CONTRAST (INCLUDING MRCP) TECHNIQUE: Multiplanar multisequence MR imaging of the abdomen was performed both before and after the administration of intravenous contrast. Heavily T2-weighted images of the biliary and pancreatic ducts were obtained, and three-dimensional MRCP images were rendered by post processing. CONTRAST:  87mL MULTIHANCE GADOBENATE DIMEGLUMINE 529 MG/ML IV SOLN COMPARISON:  CT on 03/14/2016 FINDINGS: Lower chest:  No acute findings. Hepatobiliary: Geographic pattern of fatty infiltration is seen predominately involving the right hepatic lobe on chemical shift imaging. Probable tiny sub-cm cysts noted in the left hepatic lobe, which remain too small to characterize. No liver masses identified. Gallbladder is  unremarkable. No  evidence of biliary ductal dilatation or choledocholithiasis, with common bile duct measuring approximately 3 mm. Pancreas: A focal area of pancreatic edema is seen involving the head and uncinate process, with normal passage of pancreatic duct through this area. There is mild adjacent peripancreatic edema. This is consistent with focal pancreatitis. No pancreatic mass or necrosis visualized. No evidence of pancreatic pseudocyst formation. No evidence of pancreatic ductal dilatation or pancreas divisum. Spleen:  Within normal limits in size and appearance. Adrenals/Urinary Tract: No adrenal masses are identified. Several small benign-appearing renal cysts are noted bilaterally, however no complex cystic or solid masses are identified. No evidence of hydronephrosis. Stomach/Bowel: Visualized portions within the abdomen are unremarkable. Vascular/Lymphatic: No pathologically enlarged lymph nodes identified. No abdominal aortic aneurysm demonstrated. Other:  None. Musculoskeletal:  No suspicious bone lesions identified. IMPRESSION: Focal acute pancreatitis involving the head and uncinate process. No evidence of pancreatic neoplasm. No evidence of biliary or pancreatic ductal dilatation. Geographic pattern of hepatic steatosis, mainly involving the right hepatic lobe. No evidence of hepatic mass. Small benign appearing bilateral renal cysts. No evidence of renal neoplasm or hydronephrosis. Electronically Signed   By: Earle Gell M.D.   On: 03/16/2016 07:59   Dg Abd 2 Views  Result Date: 03/14/2016 CLINICAL DATA:  Two month history of upper abdominal pain. Recent constipation. Vomiting. EXAM: ABDOMEN - 2 VIEW COMPARISON:  None. FINDINGS: Bowel gas pattern does not show evidence of ileus, obstruction or free air. There are some air-fluid levels in the colon as might be seen with liquid stool or enemas. Small bowel pattern is normal. No abnormal calcifications or bone findings. IMPRESSION: Negative radiographs. Small  air-fluid levels in the colon could be seen with liquid stool or recent enemas. Electronically Signed   By: Nelson Chimes M.D.   On: 03/14/2016 17:14   Mr Lambert Mody Cm/mrcp  Result Date: 03/16/2016 CLINICAL DATA:  Abdominal pain with nausea and vomiting for 1 week. Focal pancreatitis versus pancreatic mass and indeterminate left renal cystic lesion on recent CT. EXAM: MRI ABDOMEN WITHOUT AND WITH CONTRAST (INCLUDING MRCP) TECHNIQUE: Multiplanar multisequence MR imaging of the abdomen was performed both before and after the administration of intravenous contrast. Heavily T2-weighted images of the biliary and pancreatic ducts were obtained, and three-dimensional MRCP images were rendered by post processing. CONTRAST:  74mL MULTIHANCE GADOBENATE DIMEGLUMINE 529 MG/ML IV SOLN COMPARISON:  CT on 03/14/2016 FINDINGS: Lower chest:  No acute findings. Hepatobiliary: Geographic pattern of fatty infiltration is seen predominately involving the right hepatic lobe on chemical shift imaging. Probable tiny sub-cm cysts noted in the left hepatic lobe, which remain too small to characterize. No liver masses identified. Gallbladder is unremarkable. No evidence of biliary ductal dilatation or choledocholithiasis, with common bile duct measuring approximately 3 mm. Pancreas: A focal area of pancreatic edema is seen involving the head and uncinate process, with normal passage of pancreatic duct through this area. There is mild adjacent peripancreatic edema. This is consistent with focal pancreatitis. No pancreatic mass or necrosis visualized. No evidence of pancreatic pseudocyst formation. No evidence of pancreatic ductal dilatation or pancreas divisum. Spleen:  Within normal limits in size and appearance. Adrenals/Urinary Tract: No adrenal masses are identified. Several small benign-appearing renal cysts are noted bilaterally, however no complex cystic or solid masses are identified. No evidence of hydronephrosis. Stomach/Bowel:  Visualized portions within the abdomen are unremarkable. Vascular/Lymphatic: No pathologically enlarged lymph nodes identified. No abdominal aortic aneurysm demonstrated. Other:  None. Musculoskeletal:  No suspicious bone  lesions identified. IMPRESSION: Focal acute pancreatitis involving the head and uncinate process. No evidence of pancreatic neoplasm. No evidence of biliary or pancreatic ductal dilatation. Geographic pattern of hepatic steatosis, mainly involving the right hepatic lobe. No evidence of hepatic mass. Small benign appearing bilateral renal cysts. No evidence of renal neoplasm or hydronephrosis. Electronically Signed   By: Earle Gell M.D.   On: 03/16/2016 07:59     Subjective:   Discharge Exam: Vitals:   03/18/16 2047 03/19/16 0600  BP: 130/73 134/78  Pulse: 64 62  Resp: 18 16  Temp: 97.3 F (36.3 C) 98.4 F (36.9 C)   Vitals:   03/18/16 0611 03/18/16 1500 03/18/16 2047 03/19/16 0600  BP: 114/64 130/69 130/73 134/78  Pulse: 65 63 64 62  Resp: 18 18 18 16   Temp: 97.7 F (36.5 C) 98.5 F (36.9 C) 97.3 F (36.3 C) 98.4 F (36.9 C)  TempSrc: Oral Oral Oral Oral  SpO2: 98% 99% 99% 100%  Weight:      Height:        General: Pt is alert, awake, not in acute distress Cardiovascular: RRR, S1/S2 +, no rubs, no gallops Respiratory: CTA bilaterally, no wheezing, no rhonchi Abdominal: Soft, NT, ND, bowel sounds + Extremities: no edema, no cyanosis    The results of significant diagnostics from this hospitalization (including imaging, microbiology, ancillary and laboratory) are listed below for reference.     Microbiology: No results found for this or any previous visit (from the past 240 hour(s)).   Labs: BNP (last 3 results) No results for input(s): BNP in the last 8760 hours. Basic Metabolic Panel:  Recent Labs Lab 03/15/16 0923 03/15/16 1204 03/16/16 0410 03/19/16 0421  NA 135 135 135 138  K 4.5 4.6 4.7 4.5  CL 99 103 103 103  CO2 27 25 27 29    GLUCOSE 84 85 98 90  BUN 13 12 9 10   CREATININE 1.18 1.14 1.07 1.22  CALCIUM 9.5 8.3* 8.7* 9.2   Liver Function Tests:  Recent Labs Lab 03/15/16 0923 03/15/16 1204  AST 27 24  ALT 23 23  ALKPHOS 70 64  BILITOT 0.9 1.1  PROT 7.7 7.4  ALBUMIN 4.8 4.2    Recent Labs Lab 03/15/16 0923 03/15/16 1204  LIPASE 47 44   No results for input(s): AMMONIA in the last 168 hours. CBC:  Recent Labs Lab 03/14/16 1751 03/15/16 0929 03/15/16 1204 03/18/16 0745 03/19/16 0421  WBC 5.4 5.8 5.5 5.3 4.6  NEUTROABS  --   --  2.6  --   --   HGB 17.1 17.9 16.2 16.6 16.6  HCT 48.7 49.5 47.9 48.8 47.3  MCV 85.7 84.5 87.2 86.2 84.5  PLT 263  --  223 256 241   Cardiac Enzymes: No results for input(s): CKTOTAL, CKMB, CKMBINDEX, TROPONINI in the last 168 hours. BNP: Invalid input(s): POCBNP CBG: No results for input(s): GLUCAP in the last 168 hours. D-Dimer No results for input(s): DDIMER in the last 72 hours. Hgb A1c No results for input(s): HGBA1C in the last 72 hours. Lipid Profile No results for input(s): CHOL, HDL, LDLCALC, TRIG, CHOLHDL, LDLDIRECT in the last 72 hours. Thyroid function studies No results for input(s): TSH, T4TOTAL, T3FREE, THYROIDAB in the last 72 hours.  Invalid input(s): FREET3 Anemia work up No results for input(s): VITAMINB12, FOLATE, FERRITIN, TIBC, IRON, RETICCTPCT in the last 72 hours. Urinalysis    Component Value Date/Time   COLORURINE YELLOW 09/11/2011 [place] 09/11/2011 1125  LABSPEC 1.015 09/11/2011 1125   PHURINE 7.5 09/11/2011 1125   GLUCOSEU NEGATIVE 09/11/2011 1125   HGBUR NEGATIVE 09/11/2011 1125   BILIRUBINUR neg 10/30/2013 1653   KETONESUR NEGATIVE 09/11/2011 1125   PROTEINUR 100 10/30/2013 1653   PROTEINUR NEGATIVE 09/11/2011 1125   UROBILINOGEN 1.0 10/30/2013 1653   UROBILINOGEN 0.2 09/11/2011 1125   NITRITE neg 10/30/2013 1653   NITRITE NEGATIVE 09/11/2011 1125   LEUKOCYTESUR Negative 10/30/2013 1653    Sepsis Labs Invalid input(s): PROCALCITONIN,  WBC,  LACTICIDVEN Microbiology No results found for this or any previous visit (from the past 240 hour(s)).   Time coordinating discharge: Over 30 minutes  SIGNED:   Charlynne Cousins, MD  [facility] 03/19/2016, 8:19 AM Pager   If 7PM-7AM, please contact night-coverage [email or web address] Password [email or web address]

## 2016-03-19 NOTE — Discharge Instructions (Signed)
Jacob Cannon was admitted to the Hospital on 03/15/2016 and Discharged on Discharge Date 03/19/2016 and should be excused from work/school   for 5   days starting 03/15/2016 , may return to work/school without any restrictions.  Call Bess Harvest MD, [facility] [phone] with questions.  Charlynne Cousins M.D on 03/19/2016,at 8:21 AM  [facility] Office  [phone]

## 2016-03-19 NOTE — Progress Notes (Signed)
Patient discharged to home, all discharge medications and instructions reviewed and questions answered.  Patient to be assisted top vehicle by wheelchair.

## 2016-03-20 ENCOUNTER — Telehealth: Payer: Self-pay | Admitting: Cardiology

## 2016-03-20 MED ORDER — LISINOPRIL 40 MG PO TABS: ORAL_TABLET | ORAL | 0 refills | Status: DC

## 2016-03-20 NOTE — Telephone Encounter (Signed)
Left message for pt, he will need to get his meds from his PCP or he will need to call and make an appointment. One month refill sent to pharmacy.

## 2016-03-20 NOTE — Telephone Encounter (Signed)
New message         *STAT* If patient is at the pharmacy, call can be transferred to refill team.   1. Which medications need to be refilled? (please list name of each medication and dose if known) lisinopril 40mg  2. Which pharmacy/location (including street and city if local pharmacy) is medication to be sent to? [facility] in [place] 3. Do they need a 30 day or 90 day supply? 30 Pt has not been seen since 2013 but states Dr Stanford Breed has continued to fill his bp medication.  He would not make an appt

## 2016-03-21 ENCOUNTER — Encounter: Payer: Self-pay | Admitting: Urgent Care

## 2016-03-21 ENCOUNTER — Ambulatory Visit (INDEPENDENT_AMBULATORY_CARE_PROVIDER_SITE_OTHER): Payer: PRIVATE HEALTH INSURANCE | Admitting: Urgent Care

## 2016-03-21 VITALS — BP 118/84 | HR 74 | Temp 97.7°F | Resp 17 | Ht 65.0 in | Wt 202.0 lb

## 2016-03-21 DIAGNOSIS — K5900 Constipation, unspecified: Secondary | ICD-10-CM

## 2016-03-21 DIAGNOSIS — R1010 Upper abdominal pain, unspecified: Secondary | ICD-10-CM

## 2016-03-21 DIAGNOSIS — K859 Acute pancreatitis, unspecified: Secondary | ICD-10-CM

## 2016-03-21 LAB — CBC
HCT: 50.9 % — ABNORMAL HIGH (ref 38.5–50.0)
HEMOGLOBIN: 17.6 g/dL — AB (ref 13.2–17.1)
MCH: 29.4 pg (ref 27.0–33.0)
MCHC: 34.6 g/dL (ref 32.0–36.0)
MCV: 85 fL (ref 80.0–100.0)
MPV: 9.6 fL (ref 7.5–12.5)
PLATELETS: 278 10*3/uL (ref 140–400)
RBC: 5.99 MIL/uL — AB (ref 4.20–5.80)
RDW: 13.8 % (ref 11.0–15.0)
WBC: 4.9 10*3/uL (ref 3.8–10.8)

## 2016-03-21 LAB — BASIC METABOLIC PANEL
BUN: 10 mg/dL (ref 7–25)
CALCIUM: 10 mg/dL (ref 8.6–10.3)
CO2: 26 mmol/L (ref 20–31)
Chloride: 101 mmol/L (ref 98–110)
Creat: 1.18 mg/dL (ref 0.60–1.35)
GLUCOSE: 93 mg/dL (ref 65–99)
POTASSIUM: 4.3 mmol/L (ref 3.5–5.3)
SODIUM: 138 mmol/L (ref 135–146)

## 2016-03-21 MED ORDER — DOCUSATE SODIUM 50 MG PO CAPS: 50.0000 mg | ORAL_CAPSULE | Freq: Two times a day (BID) | ORAL | 0 refills | Status: DC

## 2016-03-21 NOTE — Progress Notes (Signed)
    MRN: [id number] DOB: 06-Nov-1970  Subjective:   Jacob Cannon is a 45 y.o. male presenting for follow up on pancreatitis suspected secondary to alcohol use. Patient was initially evaluated on 03/14/2016, found to have acute pancreatitis, hospitalized on 03/15/2016. He presents today for follow up. Reports improvement in his belly pain but still there. He is tolerating food much better as recommended by the Dr. Aileen Fass. Denies fever, n/v, bloody stools.   Jacob Cannon has a current medication list which includes the following prescription(s): aspirin-sod bicarb-citric acid, lisinopril, pantoprazole, and triamcinolone cream. Also has No Known Allergies.  Jacob Cannon  has a past medical history of Arthritis; Cardiomegaly (2010); Chest pain (Chronic); GERD (gastroesophageal reflux disease); Heart murmur (congenital); Hepatic steatosis; Hyperlipidemia; Hypertension; OSA on CPAP (2010); and Psoriasis. Also  has a past surgical history that includes Hernia repair; Cardiac catheterization; and Esophagogastroduodenoscopy (09/13/2011).  Objective:   Vitals: BP 118/84 (BP Location: Left Arm, Patient Position: Sitting, Cuff Size: Large)   Pulse 74   Temp 97.7 F (36.5 C) (Oral)   Resp 17   Ht 5\' 5"  (1.651 m)   Wt 202 lb (91.6 kg)   SpO2 99%   BMI 33.61 kg/m   Physical Exam  Constitutional: He is oriented to person, place, and time. He appears well-developed and well-nourished.  HENT:  Mouth/Throat: Oropharynx is clear and moist.  Cardiovascular: Normal rate, regular rhythm and intact distal pulses.  Exam reveals no gallop and no friction rub.   No murmur heard. Pulmonary/Chest: No respiratory distress. He has no wheezes. He has no rales.  Abdominal: Soft. Bowel sounds are normal. He exhibits no distension and no mass. There is no tenderness. There is no guarding.  Neurological: He is alert and oriented to person, place, and time.  Skin: Skin is warm and dry.   Assessment and Plan :   1.  Acute pancreatitis, unspecified pancreatitis type 2. Pain of upper abdomen 3. Constipation, unspecified constipation type - Referral to GI pending, counseled on bowel rest in setting of pancreatitis. I advised he stop using Miralax given that he reports diarrhea. Patient requested a week off from work, however I do not believe that this is medically necessary.   Jaynee Eagles, PA-C [facility] [phone] 03/21/2016 2:28 PM

## 2016-03-21 NOTE — Patient Instructions (Addendum)
Acute Pancreatitis Acute pancreatitis is a disease in which the pancreas becomes suddenly inflamed. The pancreas is a large gland located behind your stomach. The pancreas produces enzymes that help digest food. The pancreas also releases the hormones glucagon and insulin that help regulate blood sugar. Damage to the pancreas occurs when the digestive enzymes from the pancreas are activated and begin attacking the pancreas before being released into the intestine. Most acute attacks last a couple of days and can cause serious complications. Some people become dehydrated and develop low blood pressure. In severe cases, bleeding into the pancreas can lead to shock and can be life-threatening. The lungs, heart, and kidneys may fail. CAUSES  Pancreatitis can happen to anyone. In some cases, the cause is unknown. Most cases are caused by:  Alcohol abuse.  Gallstones. Other less common causes are:  Certain medicines.  Exposure to certain chemicals.  Infection.  Damage caused by an accident (trauma).  Abdominal surgery. SYMPTOMS   Pain in the upper abdomen that may radiate to the back.  Tenderness and swelling of the abdomen.  Nausea and vomiting. DIAGNOSIS  Your caregiver will perform a physical exam. Blood and stool tests may be done to confirm the diagnosis. Imaging tests may also be done, such as X-rays, CT scans, or an ultrasound of the abdomen. TREATMENT  Treatment usually requires a stay in the hospital. Treatment may include:  Pain medicine.  Fluid replacement through an intravenous line (IV).  Placing a tube in the stomach to remove stomach contents and control vomiting.  Not eating for 3 or 4 days. This gives your pancreas a rest, because enzymes are not being produced that can cause further damage.  Antibiotic medicines if your condition is caused by an infection.  Surgery of the pancreas or gallbladder. HOME CARE INSTRUCTIONS   Follow the diet advised by your  caregiver. This may involve avoiding alcohol and decreasing the amount of fat in your diet.  Eat smaller, more frequent meals. This reduces the amount of digestive juices the pancreas produces.  Drink enough fluids to keep your urine clear or pale yellow.  Only take over-the-counter or prescription medicines as directed by your caregiver.  Avoid drinking alcohol if it caused your condition.  Do not smoke.  Get plenty of rest.  Check your blood sugar at home as directed by your caregiver.  Keep all follow-up appointments as directed by your caregiver. SEEK MEDICAL CARE IF:   You do not recover as quickly as expected.  You develop new or worsening symptoms.  You have persistent pain, weakness, or nausea.  You recover and then have another episode of pain. SEEK IMMEDIATE MEDICAL CARE IF:   You are unable to eat or keep fluids down.  Your pain becomes severe.  You have a fever or persistent symptoms for more than 2 to 3 days.  You have a fever and your symptoms suddenly get worse.  Your skin or the white part of your eyes turn yellow (jaundice).  You develop vomiting.  You feel dizzy, or you faint.  Your blood sugar is high (over 300 mg/dL). MAKE SURE YOU:   Understand these instructions.  Will watch your condition.  Will get help right away if you are not doing well or get worse.   This information is not intended to replace advice given to you by your health care provider. Make sure you discuss any questions you have with your health care provider.   Document Released: 07/16/2005 Document Revised: 01/15/2012   Document Reviewed: 10/25/2011 Elsevier Interactive Patient Education 2016 [place] capsules What is this medicine? DOCUSATE (doc CUE sayt) is stool softener. It helps prevent constipation and straining or discomfort associated with hard or dry stools. This medicine may be used for other purposes; ask your health care provider or  pharmacist if you have questions. What should I tell my health care provider before I take this medicine? They need to know if you have any of these conditions: -nausea or vomiting -severe constipation -stomach pain -sudden change in bowel habit lasting more than 2 weeks -an unusual or allergic reaction to docusate, other medicines, foods, dyes, or preservatives -pregnant or trying to get pregnant -breast-feeding How should I use this medicine? Take this medicine by mouth with a glass of water. Follow the directions on the label. Take your doses at regular intervals. Do not take your medicine more often than directed. Talk to your pediatrician regarding the use of this medicine in children. While this medicine may be prescribed for children as young as 2 years for selected conditions, precautions do apply. Overdosage: If you think you have taken too much of this medicine contact a poison control center or emergency room at once. NOTE: This medicine is only for you. Do not share this medicine with others. What if I miss a dose? If you miss a dose, take it as soon as you can. If it is almost time for your next dose, take only that dose. Do not take double or extra doses. What may interact with this medicine? -mineral oil This list may not describe all possible interactions. Give your health care provider a list of all the medicines, herbs, non-prescription drugs, or dietary supplements you use. Also tell them if you smoke, drink alcohol, or use illegal drugs. Some items may interact with your medicine. What should I watch for while using this medicine? Do not use for more than one week without advice from your doctor or health care professional. If your constipation returns, check with your doctor or health care professional. Drink plenty of water while taking this medicine. Drinking water helps decrease constipation. Stop using this medicine and contact your doctor or health care professional  if you experience any rectal bleeding or do not have a bowel movement after use. These could be signs of a more serious condition. What side effects may I notice from receiving this medicine? Side effects that you should report to your doctor or health care professional as soon as possible: -allergic reactions like skin rash, itching or hives, swelling of the face, lips, or tongue Side effects that usually do not require medical attention (report to your doctor or health care professional if they continue or are bothersome): -diarrhea -stomach cramps -throat irritation This list may not describe all possible side effects. Call your doctor for medical advice about side effects. You may report side effects to FDA at 1-800-FDA-1088. Where should I keep my medicine? Keep out of the reach of children. Store at room temperature between 15 and 30 degrees C (59 and 86 degrees F). Throw away any unused medicine after the expiration date. NOTE: This sheet is a summary. It may not cover all possible information. If you have questions about this medicine, talk to your doctor, pharmacist, or health care provider.    2016, Elsevier/Gold Standard. (2007-11-06 15:56:49)     IF you received an x-ray today, you will receive an invoice from [facility]. Please contact [facility] at [phone]  with questions or concerns regarding your invoice.   IF you received labwork today, you will receive an invoice from [employer]. Please contact [facility] at [phone] with questions or concerns regarding your invoice.   Our billing staff will not be able to assist you with questions regarding bills from these companies.  You will be contacted with the lab results as soon as they are available. The fastest way to get your results is to activate your My Chart account. Instructions are located on the last page of this paperwork. If you have not heard from [place] regarding the  results in 2 weeks, please contact this office.

## 2016-03-23 ENCOUNTER — Encounter: Payer: Self-pay | Admitting: Urgent Care

## 2016-04-06 ENCOUNTER — Ambulatory Visit (INDEPENDENT_AMBULATORY_CARE_PROVIDER_SITE_OTHER): Payer: PRIVATE HEALTH INSURANCE | Admitting: Physician Assistant

## 2016-04-06 ENCOUNTER — Encounter: Payer: Self-pay | Admitting: Physician Assistant

## 2016-04-06 VITALS — BP 150/90 | HR 76 | Ht 65.0 in | Wt 206.2 lb

## 2016-04-06 DIAGNOSIS — K625 Hemorrhage of anus and rectum: Secondary | ICD-10-CM | POA: Diagnosis not present

## 2016-04-06 DIAGNOSIS — K219 Gastro-esophageal reflux disease without esophagitis: Secondary | ICD-10-CM

## 2016-04-06 DIAGNOSIS — K629 Disease of anus and rectum, unspecified: Secondary | ICD-10-CM

## 2016-04-06 DIAGNOSIS — K859 Acute pancreatitis without necrosis or infection, unspecified: Secondary | ICD-10-CM

## 2016-04-06 DIAGNOSIS — K5900 Constipation, unspecified: Secondary | ICD-10-CM

## 2016-04-06 MED ORDER — PANTOPRAZOLE SODIUM 40 MG PO TBEC: 40.0000 mg | DELAYED_RELEASE_TABLET | Freq: Every day | ORAL | 1 refills | Status: AC

## 2016-04-06 MED ORDER — NA SULFATE-K SULFATE-MG SULF 17.5-3.13-1.6 GM/177ML PO SOLN: 1.0000 | ORAL | 0 refills | Status: DC

## 2016-04-06 NOTE — Progress Notes (Signed)
Subjective:    Patient ID: MAXMILIAN TROSTEL, male    DOB: [birth year], 45 y.o.   MRN: [id number]  HPI Kaheem is a pleasant 45 year old African-American male known to Dr. Fuller Plan. He was last seen in our office in 2013. He had a recent admission 817 10/18/2015 with acute mild pancreatitis. He was not seen by GI during that admission. It was felt that this was likely EtOH induced as he had been on a vacation and consumed more alcohol than usual for him though he says only a couple of drinks per day. He does not drink alcohol on a regular basis. Ultrasound on 03/15/2016 showing no ductal dilation and no gallstones , does have a fatty liver, CT of the abdomen and pelvis again no gallstones ,he had some fullness in the head of his pancreas and subtle edema. MRI was done on 03/15/2016 confirming a focal pancreatic  edema in the head and uncinate process . Mild peri-pancreatic edema otherwise negative exam. His lipase really was not elevated during his hospitalization, LFTs were normal. Today he says he feels much better not having any significant abdominal pain. His appetite has  been fine.No nausea or vomiting. No fever. He says occasionally  gets  what he calls "muscle cramping "" in his upper abdomen. He has been having problems with constipation relatively new onset since the pancreatitis. Has  also seen small amounts of bright red blood on the tissue off and on for "a long time". He says he has a small knot  on his anus that he would likeevaluated and removed if possible. He says it's irritating. Patient is also had fairly long-term reflux type symptoms no dysphagia or odynophagia but has been on PPI therapy twice a day for several years. Family history for father with polyps  Review of Systems Pertinent positive and negative review of systems were noted in the above HPI section.  All other review of systems was otherwise negative.  Outpatient Encounter Prescriptions as of 04/06/2016  Medication Sig    . aspirin-sod bicarb-citric acid (ALKA-SELTZER) 325 MG TBEF tablet Take 325 mg by mouth every 6 (six) hours as needed (DISCOMFORT).  Marland Kitchen lisinopril (PRINIVIL,ZESTRIL) 40 MG tablet TAKE 40 MG BY MOUTH DAILY NEED TO MAKE APPOINTMENT FOR FUTURE REFILLS  . pantoprazole (PROTONIX) 40 MG tablet Take 1 tablet (40 mg total) by mouth daily with breakfast.  . polyethylene glycol powder (GLYCOLAX/MIRALAX) powder Take 17 g by mouth daily.  . [DISCONTINUED] pantoprazole (PROTONIX) 40 MG tablet Take 1 tablet (40 mg total) by mouth 2 (two) times daily.  . Na Sulfate-K Sulfate-Mg Sulf 17.5-3.13-1.6 GM/180ML SOLN Take 1 kit by mouth as directed.  . [DISCONTINUED] docusate sodium (COLACE) 50 MG capsule Take 1 capsule (50 mg total) by mouth 2 (two) times daily.   No facility-administered encounter medications on file as of 04/06/2016.    No Known Allergies Patient Active Problem List   Diagnosis Date Noted  . Acute pancreatitis 03/15/2016  . 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 Unspecified gastritis and gastroduodenitis without mention of hemorrhage 09/13/2011  . Abdominal pain, right upper quadrant 09/12/2011  . Chest pain 04/20/2011  . Cough 01/15/2011  . General medical examination 01/12/2011  . Essential hypertension   . Psoriasis   . Heart murmur   . OSA on CPAP   . Cardiomegaly    Social History   Social History  . Marital status: Married    Spouse name: N/A  . Number of children: 3  . Years of  education: N/A   Occupational History  .  [place] History Main Topics  . Smoking status: Former Smoker    Packs/day: 2.00    Types: Cigars    Quit date: 04/19/2001  . Smokeless tobacco: Never Used  . Alcohol use Yes     Comment: wine- occasionally  . Drug use: No  . Sexual activity: No   Other Topics Concern  . Not on file   Social History Narrative   3 biological kids and 5 adopted---    Mr. Rizzo family history includes Diabetes in his brother, maternal uncle, and  mother; Heart attack in his father; Heart disease in his maternal grandmother; Multiple sclerosis in his mother.      Objective:    Vitals:   04/06/16 0904  BP: (!) 150/90  Pulse: 76    Physical Exam   5, weight 206, BMI 34.3. HEENT; nontraumatic normocephalic EOMI PERRLA sclera anicteric, Cardiovascular ;regular rate and rhythm with S1-S2 no murmur or gallop, Pulmonary; clear bilaterally, Abdomen ;soft, no significant tenderness no guarding or rebound no palpable mass or hepatosplenomegaly bowel sounds present, Rectal;  stool Hemoccult negative, he does have a small firm lesion on his anus,nontender  difficult to tell if this is an inflamed hemorrhoidal tag or possible tiny condylomatous lesion., Ext; no clubbing cyanosis or edema skin warm and dry, Neuropsych; mood and affect appropriate       Assessment & Plan:   #1 45 yo AA male with recent brief hospitalization for mild acute pancreatitis. MRI confirmed focal pancreatic edema in the head and uncinate process. This is felt most likely EtOH induced. Patient does not consume alcohol on a regular basis but had been on vacation just prior to onset. He is feeling much better at this time with minimal symptoms #2 chronic GERD-on twice a day PPI-rule out Barrett's #3 new onset constipation and longer-term intermittent small-volume hematochezia-rule out occult colon lesion #4 proximal at 1 cm anal lesion which is firm and nontender this may be an inflamed hemorrhoidal tag #5 hypertension #6 hyperlipidemia  Plan; advance diet as tolerates discussed low-fat diet Schedule for EGD and colonoscopy with Dr. Fuller Plan. Procedures discussed in detail with the patient including risks and benefits and he is agreeable to proceed. He will continue Protonix 40 mg by mouth twice daily He has been using MiraLAX on a when necessary basis, advised him to use MiraLAX once daily 17 g in 8 ounces of water and titrate dosage as needed. Will further examine the  small anal lesion at the time of colonoscopy, if appropriate he would like referral to surgery for removal.    Amy S Esterwood PA-C 04/06/2016   Cc: Jaynee Eagles, PA-C

## 2016-04-06 NOTE — Patient Instructions (Signed)
You have been scheduled for an endoscopy and colonoscopy. Please follow the written instructions given to you at your visit today. Please pick up your prep supplies at the pharmacy within the next 1-3 days. If you use inhalers (even only as needed), please bring them with you on the day of your procedure. Your physician has requested that you go to www.startemmi.com and enter the access code given to you at your visit today. This web site gives a general overview about your procedure. However, you should still follow specific instructions given to you by our office regarding your preparation for the procedure.  We have sent the following medications to your pharmacy for you to pick up at your convenience: Protonix 40 mg every morning  We have given you a handout on GERD and low fat diet.

## 2016-04-06 NOTE — Progress Notes (Signed)
Reviewed and agree with management plan.  Malcolm T. Stark, MD FACG 

## 2016-04-13 ENCOUNTER — Telehealth: Payer: Self-pay

## 2016-04-13 NOTE — Telephone Encounter (Signed)
Patient request for FMLA forms to be completed. Jaynee Eagles was the provider who treated the patient. Patient did not select a provider on form he wasn't sure. Please call patient when forms are ready. [phone]. Patient paid $15 fee for forms. I will place the forms in the disability box.

## 2016-04-17 NOTE — Telephone Encounter (Signed)
Patient needs Disability forms completed by [facility], I have highlighted the areas that need to be finished. I will place the forms in your box on 04/17/16 if you could please return them to the FMLA/Disability box at the 102 checkout desk within 5-7 business days. Thank you!

## 2016-04-18 NOTE — Telephone Encounter (Signed)
I filled the paperwork out. I will place it in the FMLA box shortly.

## 2016-04-19 DIAGNOSIS — Z0271 Encounter for disability determination: Secondary | ICD-10-CM

## 2016-04-19 NOTE — Telephone Encounter (Signed)
Scanned completed paperwork in Epic and also placed a copy in the pick up draw for the patient to come pick up. Did try to call the patient but the number they gave [place] has been disconnected so if he happens to call back please let him know that his forms are ready to be picked up.

## 2016-05-02 ENCOUNTER — Ambulatory Visit (AMBULATORY_SURGERY_CENTER): Payer: PRIVATE HEALTH INSURANCE | Admitting: Gastroenterology

## 2016-05-02 ENCOUNTER — Encounter: Payer: Self-pay | Admitting: Gastroenterology

## 2016-05-02 VITALS — BP 135/90 | HR 70 | Temp 99.5°F | Resp 12 | Ht 70.0 in | Wt 200.0 lb

## 2016-05-02 DIAGNOSIS — K219 Gastro-esophageal reflux disease without esophagitis: Secondary | ICD-10-CM | POA: Diagnosis not present

## 2016-05-02 DIAGNOSIS — K921 Melena: Secondary | ICD-10-CM

## 2016-05-02 DIAGNOSIS — D123 Benign neoplasm of transverse colon: Secondary | ICD-10-CM

## 2016-05-02 DIAGNOSIS — K5900 Constipation, unspecified: Secondary | ICD-10-CM

## 2016-05-02 MED ORDER — SODIUM CHLORIDE 0.9 % IV SOLN: 500.0000 mL | INTRAVENOUS | Status: DC

## 2016-05-02 NOTE — Patient Instructions (Signed)
Discharge instructions given. Handouts on polyps and hemorrhoids. Resume previous medications. YOU HAD AN ENDOSCOPIC PROCEDURE TODAY AT THE [facility]:   Refer to the procedure report that was given to you for any specific questions about what was found during the examination.  If the procedure report does not answer your questions, please call your gastroenterologist to clarify.  If you requested that your care partner not be given the details of your procedure findings, then the procedure report has been included in a sealed envelope for you to review at your convenience later.  YOU SHOULD EXPECT: Some feelings of bloating in the abdomen. Passage of more gas than usual.  Walking can help get rid of the air that was put into your GI tract during the procedure and reduce the bloating. If you had a lower endoscopy (such as a colonoscopy or flexible sigmoidoscopy) you may notice spotting of blood in your stool or on the toilet paper. If you underwent a bowel prep for your procedure, you may not have a normal bowel movement for a few days.  Please Note:  You might notice some irritation and congestion in your nose or some drainage.  This is from the oxygen used during your procedure.  There is no need for concern and it should clear up in a day or so.  SYMPTOMS TO REPORT IMMEDIATELY:   Following lower endoscopy (colonoscopy or flexible sigmoidoscopy):  Excessive amounts of blood in the stool  Significant tenderness or worsening of abdominal pains  Swelling of the abdomen that is new, acute  Fever of 100F or higher   Following upper endoscopy (EGD)  Vomiting of blood or coffee ground material  New chest pain or pain under the shoulder blades  Painful or persistently difficult swallowing  New shortness of breath  Fever of 100F or higher  Black, tarry-looking stools  For urgent or emergent issues, a gastroenterologist can be reached at any hour by calling [phone].   DIET:  We do recommend a small meal at first, but then you may proceed to your regular diet.  Drink plenty of fluids but you should avoid alcoholic beverages for 24 hours.  ACTIVITY:  You should plan to take it easy for the rest of today and you should NOT DRIVE or use heavy machinery until tomorrow (because of the sedation medicines used during the test).    FOLLOW UP: Our staff will call the number listed on your records the next business day following your procedure to check on you and address any questions or concerns that you may have regarding the information given to you following your procedure. If we do not reach you, we will leave a message.  However, if you are feeling well and you are not experiencing any problems, there is no need to return our call.  We will assume that you have returned to your regular daily activities without incident.  If any biopsies were taken you will be contacted by phone or by letter within the next 1-3 weeks.  Please call us at [phone] if you have not heard about the biopsies in 3 weeks.    SIGNATURES/CONFIDENTIALITY: You and/or your care partner have signed paperwork which will be entered into your electronic medical record.  These signatures attest to the fact that that the information above on your After Visit Summary has been reviewed and is understood.  Full responsibility of the confidentiality of this discharge information lies with you and/or your care-partner.  

## 2016-05-02 NOTE — Progress Notes (Signed)
Called to room to assist during endoscopic procedure.  Patient ID and intended procedure confirmed with present staff. Received instructions for my participation in the procedure from the performing physician.  

## 2016-05-02 NOTE — Op Note (Signed)
[facility] Patient Name: Jacob Cannon Procedure Date: 05/02/2016 2:07 PM MRN: [id number] Endoscopist: Ladene Artist , MD Age: 45 Referring MD:  Date of Birth: Nov 30, 1970 Gender: Male Account #: [id number] Procedure:                Upper GI endoscopy Indications:              Gastro-esophageal reflux disease Medicines:                Monitored Anesthesia Care Procedure:                Pre-Anesthesia Assessment:                           - Prior to the procedure, a History and Physical                            was performed, and patient medications and                            allergies were reviewed. The patient's tolerance of                            previous anesthesia was also reviewed. The risks                            and benefits of the procedure and the sedation                            options and risks were discussed with the patient.                            All questions were answered, and informed consent                            was obtained. Prior Anticoagulants: The patient has                            taken no previous anticoagulant or antiplatelet                            agents. ASA Grade Assessment: II - A patient with                            mild systemic disease. After reviewing the risks                            and benefits, the patient was deemed in                            satisfactory condition to undergo the procedure.                           After obtaining informed consent, the endoscope was  passed under direct vision. Throughout the                            procedure, the patient's blood pressure, pulse, and                            oxygen saturations were monitored continuously. The                            Model GIF-[id number]) scope was introduced                            through the mouth, and advanced to the second part                            of duodenum. The  upper GI endoscopy was                            accomplished without difficulty. The patient                            tolerated the procedure well. Findings:                 The esophagus was normal.                           The stomach was normal.                           The examined duodenum was normal.                           The cardia and gastric fundus were normal on                            retroflexion. Complications:            No immediate complications. Estimated Blood Loss:     Estimated blood loss: none. Impression:               - Normal esophagus.                           - Normal stomach.                           - Normal examined duodenum.                           - No specimens collected. Recommendation:           - Patient has a contact number available for                            emergencies. The signs and symptoms of potential                            delayed complications were  discussed with the                            patient. Return to normal activities tomorrow.                            Written discharge instructions were provided to the                            patient.                           - Resume previous diet.                           - Continue present medications.                           - Antireflux measures Ladene Artist, MD 05/02/2016 2:57:46 PM This report has been signed electronically.

## 2016-05-02 NOTE — Progress Notes (Signed)
Spontaneous respirations throughout. VSS. Resting comfortably. To PACU on room air. Report to  Celia RN. 

## 2016-05-02 NOTE — Op Note (Addendum)
[facility] Patient Name: Jacob Cannon Procedure Date: 05/02/2016 2:08 PM MRN: [id number] Endoscopist: Ladene Artist , MD Age: 45 Referring MD:  Date of Birth: Jun 01, 1971 Gender: Male Account #: [id number] Procedure:                Colonoscopy Indications:              Hematochezia Medicines:                Monitored Anesthesia Care Procedure:                Pre-Anesthesia Assessment:                           - Prior to the procedure, a History and Physical                            was performed, and patient medications and                            allergies were reviewed. The patient's tolerance of                            previous anesthesia was also reviewed. The risks                            and benefits of the procedure and the sedation                            options and risks were discussed with the patient.                            All questions were answered, and informed consent                            was obtained. Prior Anticoagulants: The patient has                            taken no previous anticoagulant or antiplatelet                            agents. ASA Grade Assessment: II - A patient with                            mild systemic disease. After reviewing the risks                            and benefits, the patient was deemed in                            satisfactory condition to undergo the procedure.                           After obtaining informed consent, the colonoscope  was passed under direct vision. Throughout the                            procedure, the patient's blood pressure, pulse, and                            oxygen saturations were monitored continuously. The                            Model CF-HQ190L [id number]) scope was introduced                            through the anus and advanced to the the cecum,                            identified by appendiceal orifice and ileocecal                             valve. The ileocecal valve, appendiceal orifice,                            and rectum were photographed. The quality of the                            bowel preparation was excellent. The colonoscopy                            was performed without difficulty. The patient                            tolerated the procedure well. Scope In: 2:18:34 PM Scope Out: 2:32:34 PM Scope Withdrawal Time: 0 hours 11 minutes 48 seconds  Total Procedure Duration: 0 hours 14 minutes 0 seconds  Findings:                 The perianal and digital rectal examinations were                            normal.                           A 5 mm polyp was found in the transverse colon. The                            polyp was sessile. The polyp was removed with a                            cold biopsy forceps. Resection and retrieval were                            complete.                           Internal hemorrhoids were found during  retroflexion. The hemorrhoids were small and Grade                            I (internal hemorrhoids that do not prolapse).                           The exam was otherwise without abnormality on                            direct and retroflexion views. Complications:            No immediate complications. Estimated blood loss:                            None. Estimated Blood Loss:     Estimated blood loss: none. Impression:               - One 5 mm polyp in the transverse colon, removed                            with a cold biopsy forceps. Resected and retrieved.                           - Internal hemorrhoids.                           - The examination was otherwise normal on direct                            and retroflexion views. Recommendation:           - Repeat colonoscopy in 5 years for surveillance if                            polyp is precancerous, otherwise 10 years for                            routine  screening.                           - Patient has a contact number available for                            emergencies. The signs and symptoms of potential                            delayed complications were discussed with the                            patient. Return to normal activities tomorrow.                            Written discharge instructions were provided to the                            patient.                           -  Resume previous diet.                           - Continue present medications.                           - Await pathology results.                           - OTC Preparation H supp bid prn hemorrhoidal                            symptoms Ladene Artist, MD 05/02/2016 2:44:43 PM This report has been signed electronically.

## 2016-05-03 ENCOUNTER — Telehealth: Payer: Self-pay | Admitting: *Deleted

## 2016-05-03 NOTE — Telephone Encounter (Signed)
No answer. Number identifier. Message left to call if questions or concerns. 

## 2016-05-03 NOTE — Telephone Encounter (Signed)
  Follow up Call-  Call back number 05/02/2016  Post procedure Call Back phone  # [phone]  Permission to leave phone message Yes  Some recent data might be hidden    [facility]

## 2016-05-14 ENCOUNTER — Encounter: Payer: Self-pay | Admitting: Gastroenterology

## 2016-07-06 ENCOUNTER — Other Ambulatory Visit: Payer: Self-pay | Admitting: Cardiology

## 2016-07-06 NOTE — Telephone Encounter (Signed)
Rx(s) sent to pharmacy electronically.  

## 2016-10-20 ENCOUNTER — Other Ambulatory Visit: Payer: Self-pay | Admitting: Urgent Care

## 2016-10-20 DIAGNOSIS — L409 Psoriasis, unspecified: Secondary | ICD-10-CM

## 2016-10-22 NOTE — Telephone Encounter (Signed)
Left message wit wife to schedule visit before this supply is exhausted.  Meds ordered this encounter  Medications  . triamcinolone cream (KENALOG) 0.5 %    Sig: APPLY TO AFFECTED AREA TWICE A DAY    Dispense:  30 g    Refill:  0

## 2017-01-19 ENCOUNTER — Emergency Department (HOSPITAL_COMMUNITY): Payer: PRIVATE HEALTH INSURANCE

## 2017-01-19 ENCOUNTER — Encounter (HOSPITAL_COMMUNITY): Payer: Self-pay | Admitting: Emergency Medicine

## 2017-01-19 ENCOUNTER — Emergency Department (HOSPITAL_COMMUNITY)
Admission: EM | Admit: 2017-01-19 | Discharge: 2017-01-19 | Disposition: A | Discharge status: Home / Self Care | Payer: PRIVATE HEALTH INSURANCE | Attending: Emergency Medicine | Admitting: Emergency Medicine

## 2017-01-19 DIAGNOSIS — W010XXA Fall on same level from slipping, tripping and stumbling without subsequent striking against object, initial encounter: Secondary | ICD-10-CM | POA: Diagnosis not present

## 2017-01-19 DIAGNOSIS — S80212A Abrasion, left knee, initial encounter: Secondary | ICD-10-CM | POA: Insufficient documentation

## 2017-01-19 DIAGNOSIS — Y999 Unspecified external cause status: Secondary | ICD-10-CM | POA: Insufficient documentation

## 2017-01-19 DIAGNOSIS — I10 Essential (primary) hypertension: Secondary | ICD-10-CM | POA: Insufficient documentation

## 2017-01-19 DIAGNOSIS — Z87891 Personal history of nicotine dependence: Secondary | ICD-10-CM | POA: Diagnosis not present

## 2017-01-19 DIAGNOSIS — S8982XA Other specified injuries of left lower leg, initial encounter: Secondary | ICD-10-CM | POA: Insufficient documentation

## 2017-01-19 DIAGNOSIS — Y939 Activity, unspecified: Secondary | ICD-10-CM | POA: Diagnosis not present

## 2017-01-19 DIAGNOSIS — S8981XA Other specified injuries of right lower leg, initial encounter: Secondary | ICD-10-CM | POA: Diagnosis not present

## 2017-01-19 DIAGNOSIS — Y92017 Garden or yard in single-family (private) house as the place of occurrence of the external cause: Secondary | ICD-10-CM | POA: Insufficient documentation

## 2017-01-19 DIAGNOSIS — S5002XA Contusion of left elbow, initial encounter: Secondary | ICD-10-CM | POA: Insufficient documentation

## 2017-01-19 DIAGNOSIS — S59902A Unspecified injury of left elbow, initial encounter: Secondary | ICD-10-CM | POA: Diagnosis present

## 2017-01-19 MED ORDER — HYDROCODONE-ACETAMINOPHEN 5-325 MG PO TABS: 1.0000 | ORAL_TABLET | ORAL | 0 refills | Status: DC | PRN

## 2017-01-19 MED ORDER — HYDROCODONE-ACETAMINOPHEN 5-325 MG PO TABS
1.0000 | ORAL_TABLET | Freq: Once | ORAL | Status: AC
  Administered 2017-01-19: 1 via ORAL
  Filled 2017-01-19: qty 1

## 2017-01-19 NOTE — ED Notes (Signed)
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

## 2017-01-19 NOTE — Discharge Instructions (Signed)
You may take the hydrocodone prescribed for pain relief.  This will make you drowsy - do not drive within 4 hours of taking this medication.  Also use the sling for comfort and ice as much as is comfortable for the next several days to help with pain and swelling.

## 2017-01-19 NOTE — ED Triage Notes (Signed)
PT states he was putting round up in the yard and tripped over boarder to flower bed and fell upon left side/arm, and bilateral knees on concrete about an hour ago. PT c/o pain to left elbow and bilateral knees today.

## 2017-01-21 NOTE — ED Provider Notes (Signed)
[facility] DEPT Provider Note   CSN: [id number] Arrival date & time: 01/19/17  1655     History   Chief Complaint Chief Complaint  Patient presents with  . Fall    HPI Jacob Cannon is a 46 y.o. right handed male presenting with a painful left elbowl and bilateral knee pain after tripping over a flower border edge in his yard prior to arrival, landing on pavement.  He has sustained abrasion to his left knee but denies pain in the knees with ambulation movement.  His main complaint is pain in the posterior left elbow and reports difficulty extending the joint. He denies shoulder, wrist and hand pain and denies neck, back or head injury.  His tetanus is current.  The history is provided by the patient.    Past Medical History:  Diagnosis Date  . Arthritis    type?  . Cardiomegaly 2010   [facility]  . Chest pain Chronic  . GERD (gastroesophageal reflux disease)   . Heart murmur congenital  . Hepatic steatosis   . Hyperlipidemia   . Hypertension   . OSA on CPAP 2010  . Psoriasis    used to see derm   . Sleep apnea     Patient Active Problem List   Diagnosis Date Noted  . Acute pancreatitis 03/15/2016  . 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 Unspecified gastritis and gastroduodenitis without mention of hemorrhage 09/13/2011  . Abdominal pain, right upper quadrant 09/12/2011  . Chest pain 04/20/2011  . Cough 01/15/2011  . General medical examination 01/12/2011  . Essential hypertension   . Psoriasis   . Heart murmur   . OSA on CPAP   . Cardiomegaly     Past Surgical History:  Procedure Laterality Date  . CARDIAC CATHETERIZATION    . ESOPHAGOGASTRODUODENOSCOPY  09/13/2011   Procedure: ESOPHAGOGASTRODUODENOSCOPY (EGD);  Surgeon: Estanislado Emms., MD,FACG;  Location: [facility];  Service: Endoscopy;  Laterality: N/A;  . INGUINAL HERNIA REPAIR Right        Home Medications    Prior to Admission medications   Medication Sig Start Date End Date  Taking? Authorizing Provider  aspirin-sod bicarb-citric acid (ALKA-SELTZER) 325 MG TBEF tablet Take 325 mg by mouth every 6 (six) hours as needed (DISCOMFORT).    [provider]  HYDROcodone-acetaminophen (NORCO/VICODIN) 5-325 MG tablet Take 1 tablet by mouth every 4 (four) hours as needed. 01/19/17   Evalee Jefferson, PA-C  lisinopril (PRINIVIL,ZESTRIL) 40 MG tablet Take 1 tablet (40 mg total) by mouth daily. 07/06/16   Lelon Perla, MD  pantoprazole (PROTONIX) 40 MG tablet Take 1 tablet (40 mg total) by mouth daily with breakfast. 04/06/16   Esterwood, Amy S, PA-C  polyethylene glycol powder (GLYCOLAX/MIRALAX) powder Take 17 g by mouth daily.    [provider]  triamcinolone cream (KENALOG) 0.5 % APPLY TO AFFECTED AREA TWICE A DAY 10/22/16   Harrison Mons, PA-C    Family History Family History  Problem Relation Age of Onset  . Multiple sclerosis Mother   . Diabetes Mother   . Heart attack Father   . Diabetes Brother   . Heart disease Maternal Grandmother   . Diabetes Maternal Uncle   . Colon cancer Neg Hx   . Prostate cancer Neg Hx     Social History Social History  Substance Use Topics  . Smoking status: Former Smoker    Packs/day: 2.00    Types: Cigars    Quit date: 04/19/2001  . Smokeless tobacco:  Never Used  . Alcohol use Yes     Comment: wine- occasionally     Allergies   Patient has no known allergies.   Review of Systems Review of Systems  Constitutional: Negative for fever.  Musculoskeletal: Positive for arthralgias. Negative for back pain, joint swelling, myalgias and neck pain.  Skin: Positive for wound.  Neurological: Negative for weakness, numbness and headaches.     Physical Exam Updated Vital Signs BP 136/79 (BP Location: Right Arm)   Pulse 96   Temp 97.7 F (36.5 C) (Oral)   Resp 18   Ht 5' 5.5" (1.664 m)   Wt 88.5 kg (195 lb)   SpO2 99%   BMI 31.96 kg/m   Physical Exam  Constitutional: He appears well-developed and  well-nourished.  HENT:  Head: Atraumatic.  Neck: Normal range of motion.  Cardiovascular:  Pulses:      Radial pulses are 2+ on the right side, and 2+ on the left side.  Pulses equal bilaterally,   Musculoskeletal: He exhibits tenderness.       Left shoulder: Normal.       Left elbow: He exhibits no swelling, no effusion and no deformity. Tenderness found. Medial epicondyle and lateral epicondyle tenderness noted. No olecranon process tenderness noted.       Left wrist: Normal.  Neurological: He is alert. He has normal strength. He displays normal reflexes. No sensory deficit.  Equal grip strength.  Skin: Skin is warm and dry. Abrasion noted.  Small abrasion left anterior knee.  Psychiatric: He has a normal mood and affect.     ED Treatments / Results  Labs (all labs ordered are listed, but only abnormal results are displayed) Labs Reviewed - No data to display  EKG  EKG Interpretation None       Radiology Dg Elbow Complete Left  Result Date: 01/19/2017 CLINICAL DATA:  Pt fell today on cement, pt tripped over flower bed border and fell on cement injuring left elbow, unable to move EXAM: LEFT ELBOW - COMPLETE 3+ VIEW COMPARISON:  None. FINDINGS: Osseous alignment is normal. No fracture line or displaced fracture fragment identified. No evidence of joint effusion. Incidental note made of chronic mild spurring along the anterior-lateral margin of the radial head. Incidental note also made of a small enthesophyte along the posterior margin of the olecranon and a chronic calcification adjacent to the medial humeral epicondyle suggesting chronic tendinopathy. IMPRESSION: No acute findings.  No osseous fracture or dislocation. Chronic/incidental findings detailed above. Electronically Signed   By: Franki Cabot M.D.   On: 01/19/2017 17:35    Procedures Procedures (including critical care time)  Medications Ordered in ED Medications  HYDROcodone-acetaminophen (NORCO/VICODIN) 5-325  MG per tablet 1 tablet (1 tablet Oral Given 01/19/17 1835)     Initial Impression / Assessment and Plan / ED Course  I have reviewed the triage vital signs and the nursing notes.  Pertinent labs & imaging results that were available during my care of the patient were reviewed by me and considered in my medical decision making (see chart for details).     Wound care for abrasion provided, discussed imaging results.  Pt with persistent pain with attempts to extend the elbow.  No forearm or humerus pain, no deformity, neurovascular intact. Discussed low prob of occult fracture, but advised recheck if sx not improving over the next 7-10 days. Sling, ice, hydrocdone, referral provided.  Final Clinical Impressions(s) / ED Diagnoses   Final diagnoses:  Contusion of left elbow,  initial encounter  Abrasion of left knee, initial encounter    New Prescriptions Discharge Medication List as of 01/19/2017  6:51 PM    START taking these medications   Details  HYDROcodone-acetaminophen (NORCO/VICODIN) 5-325 MG tablet Take 1 tablet by mouth every 4 (four) hours as needed., Starting Sat 01/19/2017, Print         Evalee Jefferson, PA-C 01/21/17 [place], [place], DO 01/23/17 0820

## 2017-07-20 ENCOUNTER — Other Ambulatory Visit: Payer: Self-pay | Admitting: Cardiology

## 2017-07-22 NOTE — Telephone Encounter (Signed)
REFILL 

## 2017-11-23 ENCOUNTER — Other Ambulatory Visit: Payer: Self-pay | Admitting: Cardiology

## 2017-12-13 DIAGNOSIS — Z8719 Personal history of other diseases of the digestive system: Secondary | ICD-10-CM | POA: Insufficient documentation

## 2017-12-13 DIAGNOSIS — K219 Gastro-esophageal reflux disease without esophagitis: Secondary | ICD-10-CM | POA: Insufficient documentation

## 2018-07-22 ENCOUNTER — Ambulatory Visit (HOSPITAL_BASED_OUTPATIENT_CLINIC_OR_DEPARTMENT_OTHER)
Admission: RE | Admit: 2018-07-22 | Discharge: 2018-07-22 | Disposition: A | Discharge status: Home / Self Care | Payer: BLUE CROSS/BLUE SHIELD | Source: Ambulatory Visit | Attending: Cardiology | Admitting: Cardiology

## 2018-07-22 ENCOUNTER — Other Ambulatory Visit: Payer: Self-pay | Admitting: Cardiology

## 2018-07-22 ENCOUNTER — Ambulatory Visit (INDEPENDENT_AMBULATORY_CARE_PROVIDER_SITE_OTHER): Payer: BLUE CROSS/BLUE SHIELD | Admitting: Cardiology

## 2018-07-22 ENCOUNTER — Encounter: Payer: Self-pay | Admitting: Cardiology

## 2018-07-22 VITALS — BP 118/72 | HR 97 | Ht 65.5 in | Wt 206.0 lb

## 2018-07-22 DIAGNOSIS — Z01818 Encounter for other preprocedural examination: Secondary | ICD-10-CM

## 2018-07-22 DIAGNOSIS — Z9989 Dependence on other enabling machines and devices: Secondary | ICD-10-CM

## 2018-07-22 DIAGNOSIS — I209 Angina pectoris, unspecified: Secondary | ICD-10-CM | POA: Insufficient documentation

## 2018-07-22 DIAGNOSIS — I10 Essential (primary) hypertension: Secondary | ICD-10-CM

## 2018-07-22 DIAGNOSIS — G4733 Obstructive sleep apnea (adult) (pediatric): Secondary | ICD-10-CM

## 2018-07-22 DIAGNOSIS — Z01811 Encounter for preprocedural respiratory examination: Secondary | ICD-10-CM | POA: Insufficient documentation

## 2018-07-22 NOTE — Addendum Note (Signed)
Addended by: Austin Miles on: 07/22/2018 10:29 AM   Modules accepted: Orders

## 2018-07-22 NOTE — Patient Instructions (Signed)
Medication Instructions:  Your physician recommends that you continue on your current medications as directed. Please refer to the Current Medication list given to you today.  If you need a refill on your cardiac medications before your next appointment, please call your pharmacy.   Lab work: Your physician recommends that you return for lab work today: CBC, BMP.   If you have labs (blood work) drawn today and your tests are completely normal, you will receive your results only by: Marland Kitchen MyChart Message (if you have MyChart) OR . A paper copy in the mail If you have any lab test that is abnormal or we need to change your treatment, we will call you to review the results.  Testing/Procedures: A chest x-ray takes a picture of the organs and structures inside the chest, including the heart, lungs, and blood vessels. This test can show several things, including, whether the heart is enlarges; whether fluid is building up in the lungs; and whether pacemaker / defibrillator leads are still in place.     [facility] [address] [place], [place] [address] Dept: [phone] Loc: [facility]  07/22/2018  You are scheduled for a Cardiac Catheterization on Tuesday, December 31 with Dr. Daneen Schick.  1. Please arrive at the [facility] (Main Entrance A) at [facility]: [address] [place], [place] [address] at 5:30 AM (This time is two hours before your procedure to ensure your preparation). Free valet parking service is available.   Special note: Every effort is made to have your procedure done on time. Please understand that emergencies sometimes delay scheduled procedures.  2. Diet: Do not eat solid foods after midnight.  The patient may have clear liquids until 5am upon the day of the procedure.  3. Labs: None needed.  4. Medication instructions in preparation for your  procedure:   Contrast Allergy: No   Stop taking, Lisinopril (Zestril or Prinivil) Monday, December 30,  On the morning of your procedure, take your Aspirin and any morning medicines NOT listed above.  You may use sips of water.  5. Plan for one night stay--bring personal belongings. 6. Bring a current list of your medications and current insurance cards. 7. You MUST have a responsible person to drive you home. 8. Someone MUST be with you the first 24 hours after you arrive home or your discharge will be delayed. 9. Please wear clothes that are easy to get on and off and wear slip-on shoes.  Thank you for allowing [place] to care for you!   -- [facility]   Follow-Up: At [facility], you and your health needs are our priority.  As part of our continuing mission to provide you with exceptional heart care, we have created designated Provider Care Teams.  These Care Teams include your primary Cardiologist (physician) and Advanced Practice Providers (APPs -  Physician Assistants and Nurse Practitioners) who all work together to provide you with the care you need, when you need it. You will need a follow up appointment after your heart catheterization is completed.     Coronary Angiogram With Stent Coronary angiogram with stent placement is a procedure to widen or open a narrow blood vessel of the heart (coronary artery). Arteries may become blocked by cholesterol buildup (plaques) in the lining of the wall. When a coronary artery becomes partially blocked, blood flow to that area decreases. This  may lead to chest pain or a heart attack (myocardial infarction). A stent is a small piece of metal that looks like mesh or a spring. Stent placement may be done as treatment for a heart attack or right after a coronary angiogram in which a blocked artery is found. Let your health care provider know about:  Any allergies you have.  All medicines you are taking,  including vitamins, herbs, eye drops, creams, and over-the-counter medicines.  Any problems you or family members have had with anesthetic medicines.  Any blood disorders you have.  Any surgeries you have had.  Any medical conditions you have.  Whether you are pregnant or may be pregnant. What are the risks? Generally, this is a safe procedure. However, problems may occur, including:  Damage to the heart or its blood vessels.  A return of blockage.  Bleeding, infection, or bruising at the insertion site.  A collection of blood under the skin (hematoma) at the insertion site.  A blood clot in another part of the body.  Kidney injury.  Allergic reaction to the dye or contrast that is used.  Bleeding into the abdomen (retroperitoneal bleeding). What happens before the procedure? Staying hydrated Follow instructions from your health care provider about hydration, which may include:  Up to 2 hours before the procedure - you may continue to drink clear liquids, such as water, clear fruit juice, black coffee, and plain tea.  Eating and drinking restrictions Follow instructions from your health care provider about eating and drinking, which may include:  8 hours before the procedure - stop eating heavy meals or foods such as meat, fried foods, or fatty foods.  6 hours before the procedure - stop eating light meals or foods, such as toast or cereal.  2 hours before the procedure - stop drinking clear liquids. Ask your health care provider about:  Changing or stopping your regular medicines. This is especially important if you are taking diabetes medicines or blood thinners.  Taking medicines such as ibuprofen. These medicines can thin your blood. Do not take these medicines before your procedure if your health care provider instructs you not to. Generally, aspirin is recommended before a procedure of passing a small, thin tube (catheter) through a blood vessel and into the heart  (cardiac catheterization). What happens during the procedure?   An IV tube will be inserted into one of your veins.  You will be given one or more of the following: ? A medicine to help you relax (sedative). ? A medicine to numb the area where the catheter will be inserted into an artery (local anesthetic).  To reduce your risk of infection: ? Your health care team will wash or sanitize their hands. ? Your skin will be washed with soap. ? Hair may be removed from the area where the catheter will be inserted.  Using a guide wire, the catheter will be inserted into an artery. The location may be in your groin, in your wrist, or in the fold of your arm (near your elbow).  A type of X-ray (fluoroscopy) will be used to help guide the catheter to the opening of the arteries in the heart.  A dye will be injected into the catheter, and X-rays will be taken. The dye will help to show where any narrowing or blockages are located in the arteries.  A tiny wire will be guided to the blocked spot, and a balloon will be inflated to make the artery wider.  The stent  will be expanded and will crush the plaques into the wall of the vessel. The stent will hold the area open and improve the blood flow. Most stents have a drug coating to reduce the risk of the stent narrowing over time.  The artery may be made wider using a drill, laser, or other tools to remove plaques.  When the blood flow is better, the catheter will be removed. The lining of the artery will grow over the stent, which stays where it was placed. This procedure may vary among health care providers and hospitals. What happens after the procedure?  If the procedure is done through the leg, you will be kept in bed lying flat for about 6 hours. You will be instructed to not bend and not cross your legs.  The insertion site will be checked frequently.  The pulse in your foot or wrist will be checked frequently.  You may have additional  blood tests, X-rays, and a test that records the electrical activity of your heart (electrocardiogram, or ECG). This information is not intended to replace advice given to you by your health care provider. Make sure you discuss any questions you have with your health care provider. Document Released: 01/20/2003 Document Revised: 10/25/2017 Document Reviewed: 02/19/2016 Elsevier Interactive Patient Education  2019 [employer].

## 2018-07-22 NOTE — H&P (View-Only) (Signed)
Cardiology Office Note:    Date:  07/22/2018   ID:  Jacob Cannon, DOB 1971-06-10, MRN [id number]  PCP:  Bernerd Limbo, MD  Cardiologist:  Jenean Lindau, MD   Referring MD: No ref. provider found    ASSESSMENT:    1. Angina pectoris ([place])   2. Essential hypertension   3. OSA on CPAP    PLAN:    In order of problems listed above:  1. Primary prevention stressed with the patient.  Importance of compliance with diet and medication stressed and he vocalized understanding.  His blood pressure is stable.  Diet was discussed for essential hypertension and also to reduce weight.  He is overweight and I discussed these issues with him. 2. The patient's chest pains are very suggestive of angina and are concerning.  I had the following discussion with him at length and he and his wife understood and questions were answered to their satisfaction. 3. In view of the patient's symptoms, I discussed with the patient options for evaluation. Invasive and noninvasive options were given to the patient. I discussed stress testing and coronary angiography and left heart catheterization at length. Benefits, pros and cons of each approach were discussed at length. Patient had multiple questions which were answered to the patient's satisfaction. Patient opted for invasive evaluation and we will set up for coronary angiography and left heart catheterization. Further recommendations will be made based on the findings with coronary angiography. In the interim if the patient has any significant symptoms in hospital to the nearest emergency room. 4. Sublingual nitroglycerin prescription was sent, its protocol and 911 protocol explained and the patient vocalized understanding questions were answered to the patient's satisfaction.   Medication Adjustments/Labs and Tests Ordered: Current medicines are reviewed at length with the patient today.  Concerns regarding medicines are outlined above.  No orders of the  defined types were placed in this encounter.  No orders of the defined types were placed in this encounter.    History of Present Illness:    Jacob Cannon is a 47 y.o. male who is being seen today for the evaluation of chest tightness on exertion.  Patient is a pleasant 47 year old male.  He has past medical history of essential hypertension and sleep apnea.  I am not sure about his cholesterol status.  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  Recently in the past couple of months he has been noticing chest tightness on exertion.  He tells me that whenever he exerts himself he has substernal chest tightness radiating to the left shoulder and this concerns him.  He stops and rests and feels better.  His primary care physician has given him a prescription for nitroglycerin.  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  At the time of my evaluation, the patient is alert awake oriented and in no distress.  Past Medical History:  Diagnosis Date  . Arthritis    type?  . Cardiomegaly 2010   [facility]  . Chest pain Chronic  . GERD (gastroesophageal reflux disease)   . Heart murmur congenital  . Hepatic steatosis   . Hyperlipidemia   . Hypertension   . OSA on CPAP 2010  . Psoriasis    used to see derm   . Sleep apnea  Past Surgical History:  Procedure Laterality Date  . CARDIAC CATHETERIZATION    . ESOPHAGOGASTRODUODENOSCOPY  09/13/2011   Procedure: ESOPHAGOGASTRODUODENOSCOPY (EGD);  Surgeon: Estanislado Emms., MD,FACG;  Location: [facility];  Service: Endoscopy;  Laterality: N/A;  . INGUINAL HERNIA REPAIR Right     Current Medications: Current Meds  Medication Sig  . aspirin-sod bicarb-citric acid (ALKA-SELTZER) 325 MG TBEF tablet Take 325 mg by mouth every 6 (six) hours as  needed (DISCOMFORT).  Marland Kitchen HYDROcodone-acetaminophen (NORCO/VICODIN) 5-325 MG tablet Take 1 tablet by mouth every 4 (four) hours as needed.  Marland Kitchen lisinopril (PRINIVIL,ZESTRIL) 40 MG tablet Take 1 tablet (40 mg total) by mouth daily. NEED OV.  . methocarbamol (ROBAXIN) 500 MG tablet Take 1 tablet by mouth as needed.  . nitroGLYCERIN (NITROSTAT) 0.3 MG SL tablet Place 1 tablet under the tongue every 5 (five) minutes as needed.  . pantoprazole (PROTONIX) 40 MG tablet Take 1 tablet (40 mg total) by mouth daily with breakfast.  . polyethylene glycol powder (GLYCOLAX/MIRALAX) powder Take 17 g by mouth daily.  . predniSONE (DELTASONE) 10 MG tablet Take 10 tablets by mouth daily.  Marland Kitchen triamcinolone cream (KENALOG) 0.5 % APPLY TO AFFECTED AREA TWICE A DAY   Current Facility-Administered Medications for the 07/22/18 encounter (Office Visit) with Aqeel Norgaard, Reita Cliche, MD  Medication  . 0.9 %  sodium chloride infusion     Allergies:   Patient has no known allergies.   Social History   Socioeconomic History  . Marital status: Married    Spouse name: Not on file  . Number of children: 3  . Years of education: Not on file  . Highest education level: Not on file  Occupational History    Employer: [employer]  Social Needs  . Financial resource strain: Not on file  . Food insecurity:    Worry: Not on file    Inability: Not on file  . Transportation needs:    Medical: Not on file    Non-medical: Not on file  Tobacco Use  . Smoking status: Former Smoker    Packs/day: 2.00    Types: Cigars    Last attempt to quit: 04/19/2001    Years since quitting: 17.2  . Smokeless tobacco: Never Used  Substance and Sexual Activity  . Alcohol use: Yes    Comment: wine- occasionally  . Drug use: No  . Sexual activity: Never  Lifestyle  . Physical activity:    Days per week: Not on file    Minutes per session: Not on file  . Stress: Not on file  Relationships  . Social connections:    Talks on phone: Not on  file    Gets together: Not on file    Attends religious service: Not on file    Active member of club or organization: Not on file    Attends meetings of clubs or organizations: Not on file    Relationship status: Not on file  Other Topics Concern  . Not on file  Social History Narrative   3 biological kids and 5 adopted---     Family History: The patient's family history includes Diabetes in his brother, maternal uncle, and mother; Heart attack in his father; Heart disease in his maternal grandmother; Multiple sclerosis in his mother. There is no history of Colon cancer or Prostate cancer.  ROS:   Please see the history of present illness.    All other systems reviewed and are negative.  EKGs/Labs/Other Studies Reviewed:  The following studies were reviewed today: I discussed my findings with the patient at extensive length.  EKG reveals sinus rhythm and nonspecific ST-T changes.   Recent Labs: No results found for requested labs within last 8760 hours.  Recent Lipid Panel    Component Value Date/Time   CHOL 184 03/16/2016 0410   TRIG 114 03/16/2016 0410   HDL 30 (L) 03/16/2016 0410   CHOLHDL 6.1 03/16/2016 0410   VLDL 23 03/16/2016 0410   LDLCALC 131 (H) 03/16/2016 0410   LDLDIRECT 150.9 01/12/2011 1002    Physical Exam:    VS:  BP 118/72 (BP Location: Right Arm, Patient Position: Sitting, Cuff Size: Normal)   Pulse 97   Ht 5' 5.5" (1.664 m)   Wt 206 lb (93.4 kg)   SpO2 98%   BMI 33.76 kg/m     Wt Readings from Last 3 Encounters:  07/22/18 206 lb (93.4 kg)  01/19/17 195 lb (88.5 kg)  05/02/16 200 lb (90.7 kg)     GEN: Patient is in no acute distress HEENT: Normal NECK: No JVD; No carotid bruits LYMPHATICS: No lymphadenopathy CARDIAC: S1 S2 regular, 2/6 systolic murmur at the apex. RESPIRATORY:  Clear to auscultation without rales, wheezing or rhonchi  ABDOMEN: Soft, non-tender, non-distended MUSCULOSKELETAL:  No edema; No deformity  SKIN: Warm and  dry NEUROLOGIC:  Alert and oriented x 3 PSYCHIATRIC:  Normal affect    Signed, Jenean Lindau, MD  07/22/2018 9:57 AM    [place]

## 2018-07-22 NOTE — Progress Notes (Signed)
Cardiology Office Note:    Date:  07/22/2018   ID:  Jacob Cannon, DOB June 08, 1971, MRN [id number]  PCP:  Bernerd Limbo, MD  Cardiologist:  Jenean Lindau, MD   Referring MD: No ref. provider found    ASSESSMENT:    1. Angina pectoris ([place])   2. Essential hypertension   3. OSA on CPAP    PLAN:    In order of problems listed above:  1. Primary prevention stressed with the patient.  Importance of compliance with diet and medication stressed and he vocalized understanding.  His blood pressure is stable.  Diet was discussed for essential hypertension and also to reduce weight.  He is overweight and I discussed these issues with him. 2. The patient's chest pains are very suggestive of angina and are concerning.  I had the following discussion with him at length and he and his wife understood and questions were answered to their satisfaction. 3. In view of the patient's symptoms, I discussed with the patient options for evaluation. Invasive and noninvasive options were given to the patient. I discussed stress testing and coronary angiography and left heart catheterization at length. Benefits, pros and cons of each approach were discussed at length. Patient had multiple questions which were answered to the patient's satisfaction. Patient opted for invasive evaluation and we will set up for coronary angiography and left heart catheterization. Further recommendations will be made based on the findings with coronary angiography. In the interim if the patient has any significant symptoms in hospital to the nearest emergency room. 4. Sublingual nitroglycerin prescription was sent, its protocol and 911 protocol explained and the patient vocalized understanding questions were answered to the patient's satisfaction.   Medication Adjustments/Labs and Tests Ordered: Current medicines are reviewed at length with the patient today.  Concerns regarding medicines are outlined above.  No orders of the  defined types were placed in this encounter.  No orders of the defined types were placed in this encounter.    History of Present Illness:    Jacob Cannon is a 47 y.o. male who is being seen today for the evaluation of chest tightness on exertion.  Patient is a pleasant 47 year old male.  He has past medical history of essential hypertension and sleep apnea.  I am not sure about his cholesterol status.  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  Recently in the past couple of months he has been noticing chest tightness on exertion.  He tells me that whenever he exerts himself he has substernal chest tightness radiating to the left shoulder and this concerns him.  He stops and rests and feels better.  His primary care physician has given him a prescription for nitroglycerin.  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  At the time of my evaluation, the patient is alert awake oriented and in no distress.  Past Medical History:  Diagnosis Date  . Arthritis    type?  . Cardiomegaly 2010   [facility]  . Chest pain Chronic  . GERD (gastroesophageal reflux disease)   . Heart murmur congenital  . Hepatic steatosis   . Hyperlipidemia   . Hypertension   . OSA on CPAP 2010  . Psoriasis    used to see derm   . Sleep apnea  Past Surgical History:  Procedure Laterality Date  . CARDIAC CATHETERIZATION    . ESOPHAGOGASTRODUODENOSCOPY  09/13/2011   Procedure: ESOPHAGOGASTRODUODENOSCOPY (EGD);  Surgeon: Estanislado Emms., MD,FACG;  Location: [facility];  Service: Endoscopy;  Laterality: N/A;  . INGUINAL HERNIA REPAIR Right     Current Medications: Current Meds  Medication Sig  . aspirin-sod bicarb-citric acid (ALKA-SELTZER) 325 MG TBEF tablet Take 325 mg by mouth every 6 (six) hours as  needed (DISCOMFORT).  Marland Kitchen HYDROcodone-acetaminophen (NORCO/VICODIN) 5-325 MG tablet Take 1 tablet by mouth every 4 (four) hours as needed.  Marland Kitchen lisinopril (PRINIVIL,ZESTRIL) 40 MG tablet Take 1 tablet (40 mg total) by mouth daily. NEED OV.  . methocarbamol (ROBAXIN) 500 MG tablet Take 1 tablet by mouth as needed.  . nitroGLYCERIN (NITROSTAT) 0.3 MG SL tablet Place 1 tablet under the tongue every 5 (five) minutes as needed.  . pantoprazole (PROTONIX) 40 MG tablet Take 1 tablet (40 mg total) by mouth daily with breakfast.  . polyethylene glycol powder (GLYCOLAX/MIRALAX) powder Take 17 g by mouth daily.  . predniSONE (DELTASONE) 10 MG tablet Take 10 tablets by mouth daily.  Marland Kitchen triamcinolone cream (KENALOG) 0.5 % APPLY TO AFFECTED AREA TWICE A DAY   Current Facility-Administered Medications for the 07/22/18 encounter (Office Visit) with Kymia Simi, Reita Cliche, MD  Medication  . 0.9 %  sodium chloride infusion     Allergies:   Patient has no known allergies.   Social History   Socioeconomic History  . Marital status: Married    Spouse name: Not on file  . Number of children: 3  . Years of education: Not on file  . Highest education level: Not on file  Occupational History    Employer: [employer]  Social Needs  . Financial resource strain: Not on file  . Food insecurity:    Worry: Not on file    Inability: Not on file  . Transportation needs:    Medical: Not on file    Non-medical: Not on file  Tobacco Use  . Smoking status: Former Smoker    Packs/day: 2.00    Types: Cigars    Last attempt to quit: 04/19/2001    Years since quitting: 17.2  . Smokeless tobacco: Never Used  Substance and Sexual Activity  . Alcohol use: Yes    Comment: wine- occasionally  . Drug use: No  . Sexual activity: Never  Lifestyle  . Physical activity:    Days per week: Not on file    Minutes per session: Not on file  . Stress: Not on file  Relationships  . Social connections:    Talks on phone: Not on  file    Gets together: Not on file    Attends religious service: Not on file    Active member of club or organization: Not on file    Attends meetings of clubs or organizations: Not on file    Relationship status: Not on file  Other Topics Concern  . Not on file  Social History Narrative   3 biological kids and 5 adopted---     Family History: The patient's family history includes Diabetes in his brother, maternal uncle, and mother; Heart attack in his father; Heart disease in his maternal grandmother; Multiple sclerosis in his mother. There is no history of Colon cancer or Prostate cancer.  ROS:   Please see the history of present illness.    All other systems reviewed and are negative.  EKGs/Labs/Other Studies Reviewed:  The following studies were reviewed today: I discussed my findings with the patient at extensive length.  EKG reveals sinus rhythm and nonspecific ST-T changes.   Recent Labs: No results found for requested labs within last 8760 hours.  Recent Lipid Panel    Component Value Date/Time   CHOL 184 03/16/2016 0410   TRIG 114 03/16/2016 0410   HDL 30 (L) 03/16/2016 0410   CHOLHDL 6.1 03/16/2016 0410   VLDL 23 03/16/2016 0410   LDLCALC 131 (H) 03/16/2016 0410   LDLDIRECT 150.9 01/12/2011 1002    Physical Exam:    VS:  BP 118/72 (BP Location: Right Arm, Patient Position: Sitting, Cuff Size: Normal)   Pulse 97   Ht 5' 5.5" (1.664 m)   Wt 206 lb (93.4 kg)   SpO2 98%   BMI 33.76 kg/m     Wt Readings from Last 3 Encounters:  07/22/18 206 lb (93.4 kg)  01/19/17 195 lb (88.5 kg)  05/02/16 200 lb (90.7 kg)     GEN: Patient is in no acute distress HEENT: Normal NECK: No JVD; No carotid bruits LYMPHATICS: No lymphadenopathy CARDIAC: S1 S2 regular, 2/6 systolic murmur at the apex. RESPIRATORY:  Clear to auscultation without rales, wheezing or rhonchi  ABDOMEN: Soft, non-tender, non-distended MUSCULOSKELETAL:  No edema; No deformity  SKIN: Warm and  dry NEUROLOGIC:  Alert and oriented x 3 PSYCHIATRIC:  Normal affect    Signed, Jenean Lindau, MD  07/22/2018 9:57 AM    [place]

## 2018-07-23 LAB — CBC
HEMATOCRIT: 53.2 % — AB (ref 37.5–51.0)
Hemoglobin: 18.2 g/dL — ABNORMAL HIGH (ref 13.0–17.7)
MCH: 28.9 pg (ref 26.6–33.0)
MCHC: 34.2 g/dL (ref 31.5–35.7)
MCV: 85 fL (ref 79–97)
Platelets: 238 10*3/uL (ref 150–450)
RBC: 6.29 x10E6/uL — AB (ref 4.14–5.80)
RDW: 13.8 % (ref 12.3–15.4)
WBC: 8.2 10*3/uL (ref 3.4–10.8)

## 2018-07-23 LAB — BASIC METABOLIC PANEL
BUN/Creatinine Ratio: 12 (ref 9–20)
BUN: 14 mg/dL (ref 6–24)
CALCIUM: 9.7 mg/dL (ref 8.7–10.2)
CO2: 23 mmol/L (ref 20–29)
CREATININE: 1.14 mg/dL (ref 0.76–1.27)
Chloride: 103 mmol/L (ref 96–106)
GFR calc Af Amer: 88 mL/min/{1.73_m2} (ref >59)
GFR calc non Af Amer: 76 mL/min/{1.73_m2} (ref >59)
GLUCOSE: 97 mg/dL (ref 65–99)
Potassium: 4.8 mmol/L (ref 3.5–5.2)
SODIUM: 141 mmol/L (ref 134–144)

## 2018-07-29 ENCOUNTER — Encounter (HOSPITAL_COMMUNITY)
Admission: RE | Disposition: A | Discharge status: Home / Self Care | Payer: Self-pay | Source: Home / Self Care | Attending: Interventional Cardiology

## 2018-07-29 ENCOUNTER — Encounter (HOSPITAL_COMMUNITY): Payer: Self-pay | Admitting: Interventional Cardiology

## 2018-07-29 ENCOUNTER — Ambulatory Visit (HOSPITAL_COMMUNITY)
Admission: RE | Admit: 2018-07-29 | Discharge: 2018-07-29 | Disposition: A | Discharge status: Home / Self Care | Payer: BLUE CROSS/BLUE SHIELD | Attending: Interventional Cardiology | Admitting: Interventional Cardiology

## 2018-07-29 DIAGNOSIS — I209 Angina pectoris, unspecified: Secondary | ICD-10-CM | POA: Diagnosis present

## 2018-07-29 DIAGNOSIS — R0789 Other chest pain: Secondary | ICD-10-CM | POA: Diagnosis not present

## 2018-07-29 DIAGNOSIS — M1990 Unspecified osteoarthritis, unspecified site: Secondary | ICD-10-CM | POA: Insufficient documentation

## 2018-07-29 DIAGNOSIS — E785 Hyperlipidemia, unspecified: Secondary | ICD-10-CM | POA: Insufficient documentation

## 2018-07-29 DIAGNOSIS — K219 Gastro-esophageal reflux disease without esophagitis: Secondary | ICD-10-CM | POA: Diagnosis not present

## 2018-07-29 DIAGNOSIS — R011 Cardiac murmur, unspecified: Secondary | ICD-10-CM | POA: Diagnosis present

## 2018-07-29 DIAGNOSIS — Z87891 Personal history of nicotine dependence: Secondary | ICD-10-CM | POA: Diagnosis not present

## 2018-07-29 DIAGNOSIS — I110 Hypertensive heart disease with heart failure: Secondary | ICD-10-CM | POA: Insufficient documentation

## 2018-07-29 DIAGNOSIS — G4733 Obstructive sleep apnea (adult) (pediatric): Secondary | ICD-10-CM | POA: Insufficient documentation

## 2018-07-29 DIAGNOSIS — Z8249 Family history of ischemic heart disease and other diseases of the circulatory system: Secondary | ICD-10-CM | POA: Insufficient documentation

## 2018-07-29 DIAGNOSIS — I517 Cardiomegaly: Secondary | ICD-10-CM | POA: Diagnosis present

## 2018-07-29 DIAGNOSIS — I5032 Chronic diastolic (congestive) heart failure: Secondary | ICD-10-CM | POA: Diagnosis not present

## 2018-07-29 DIAGNOSIS — Z9989 Dependence on other enabling machines and devices: Secondary | ICD-10-CM

## 2018-07-29 DIAGNOSIS — I10 Essential (primary) hypertension: Secondary | ICD-10-CM | POA: Diagnosis present

## 2018-07-29 HISTORY — PX: LEFT HEART CATH AND CORONARY ANGIOGRAPHY: CATH118249

## 2018-07-29 SURGERY — LEFT HEART CATH AND CORONARY ANGIOGRAPHY
Anesthesia: LOCAL

## 2018-07-29 MED ORDER — SODIUM CHLORIDE 0.9% FLUSH: 3.0000 mL | Freq: Two times a day (BID) | INTRAVENOUS | Status: DC

## 2018-07-29 MED ORDER — VERAPAMIL HCL 2.5 MG/ML IV SOLN
INTRAVENOUS | Status: AC
  Filled 2018-07-29: qty 2

## 2018-07-29 MED ORDER — VERAPAMIL HCL 2.5 MG/ML IV SOLN
INTRAVENOUS | Status: DC | PRN
  Administered 2018-07-29: 08:00:00 via INTRA_ARTERIAL

## 2018-07-29 MED ORDER — SODIUM CHLORIDE 0.9% FLUSH: 3.0000 mL | INTRAVENOUS | Status: DC | PRN

## 2018-07-29 MED ORDER — HEPARIN SODIUM (PORCINE) 1000 UNIT/ML IJ SOLN
INTRAMUSCULAR | Status: AC
  Filled 2018-07-29: qty 1

## 2018-07-29 MED ORDER — HEPARIN (PORCINE) IN NACL 1000-0.9 UT/500ML-% IV SOLN
INTRAVENOUS | Status: DC | PRN
  Administered 2018-07-29 (×2): 500 mL

## 2018-07-29 MED ORDER — HEPARIN (PORCINE) IN NACL 1000-0.9 UT/500ML-% IV SOLN
INTRAVENOUS | Status: AC
  Filled 2018-07-29: qty 1000

## 2018-07-29 MED ORDER — HEPARIN SODIUM (PORCINE) 1000 UNIT/ML IJ SOLN
INTRAMUSCULAR | Status: DC | PRN
  Administered 2018-07-29: 4500 [IU] via INTRAVENOUS

## 2018-07-29 MED ORDER — SODIUM CHLORIDE 0.9 % WEIGHT BASED INFUSION
3.0000 mL/kg/h | INTRAVENOUS | Status: AC
  Administered 2018-07-29: 3 mL/kg/h via INTRAVENOUS

## 2018-07-29 MED ORDER — ASPIRIN 81 MG PO CHEW
81.0000 mg | CHEWABLE_TABLET | ORAL | Status: AC
  Administered 2018-07-29: 81 mg via ORAL
  Filled 2018-07-29: qty 1

## 2018-07-29 MED ORDER — LIDOCAINE HCL (PF) 1 % IJ SOLN
INTRAMUSCULAR | Status: AC
  Filled 2018-07-29: qty 30

## 2018-07-29 MED ORDER — ONDANSETRON HCL 4 MG/2ML IJ SOLN: 4.0000 mg | Freq: Four times a day (QID) | INTRAMUSCULAR | Status: DC | PRN

## 2018-07-29 MED ORDER — FENTANYL CITRATE (PF) 100 MCG/2ML IJ SOLN
INTRAMUSCULAR | Status: DC | PRN
  Administered 2018-07-29: 25 ug via INTRAVENOUS

## 2018-07-29 MED ORDER — FENTANYL CITRATE (PF) 100 MCG/2ML IJ SOLN
INTRAMUSCULAR | Status: AC
  Filled 2018-07-29: qty 2

## 2018-07-29 MED ORDER — MIDAZOLAM HCL 2 MG/2ML IJ SOLN
INTRAMUSCULAR | Status: DC | PRN
  Administered 2018-07-29 (×2): 1 mg via INTRAVENOUS

## 2018-07-29 MED ORDER — MIDAZOLAM HCL 2 MG/2ML IJ SOLN
INTRAMUSCULAR | Status: AC
  Filled 2018-07-29: qty 2

## 2018-07-29 MED ORDER — LIDOCAINE HCL (PF) 1 % IJ SOLN
INTRAMUSCULAR | Status: DC | PRN
  Administered 2018-07-29: 2 mL

## 2018-07-29 MED ORDER — SODIUM CHLORIDE 0.9 % IV SOLN: 250.0000 mL | INTRAVENOUS | Status: DC | PRN

## 2018-07-29 MED ORDER — SODIUM CHLORIDE 0.9 % WEIGHT BASED INFUSION: 1.0000 mL/kg/h | INTRAVENOUS | Status: DC

## 2018-07-29 MED ORDER — ACETAMINOPHEN 325 MG PO TABS
650.0000 mg | ORAL_TABLET | ORAL | Status: DC | PRN
  Administered 2018-07-29: 650 mg via ORAL
  Filled 2018-07-29: qty 2

## 2018-07-29 MED ORDER — SODIUM CHLORIDE 0.9 % IV SOLN: INTRAVENOUS | Status: DC

## 2018-07-29 SURGICAL SUPPLY — 11 items
CATH INFINITI 5 FR JL3.5 (CATHETERS) ×1 IMPLANT
CATH INFINITI JR4 5F (CATHETERS) ×1 IMPLANT
DEVICE RAD COMP TR BAND LRG (VASCULAR PRODUCTS) ×1 IMPLANT
GLIDESHEATH SLEND A-KIT 6F 22G (SHEATH) ×1 IMPLANT
GUIDEWIRE INQWIRE 1.5J.035X260 (WIRE) IMPLANT
INQWIRE 1.5J .035X260CM (WIRE) ×2
KIT HEART LEFT (KITS) ×2 IMPLANT
PACK CARDIAC CATHETERIZATION (CUSTOM PROCEDURE TRAY) ×2 IMPLANT
SHEATH PROBE COVER 6X72 (BAG) ×1 IMPLANT
TRANSDUCER W/STOPCOCK (MISCELLANEOUS) ×2 IMPLANT
TUBING CIL FLEX 10 FLL-RA (TUBING) ×2 IMPLANT

## 2018-07-29 NOTE — Discharge Instructions (Signed)
Radial Site Care ° °This sheet gives you information about how to care for yourself after your procedure. Your health care provider may also give you more specific instructions. If you have problems or questions, contact your health care provider. °What can I expect after the procedure? °After the procedure, it is common to have: °· Bruising and tenderness at the catheter insertion area. °Follow these instructions at home: °Medicines °· Take over-the-counter and prescription medicines only as told by your health care provider. °Insertion site care °· Follow instructions from your health care provider about how to take care of your insertion site. Make sure you: °? Wash your hands with soap and water before you change your bandage (dressing). If soap and water are not available, use hand sanitizer. °? Change your dressing as told by your health care provider. °? Leave stitches (sutures), skin glue, or adhesive strips in place. These skin closures may need to stay in place for 2 weeks or longer. If adhesive strip edges start to loosen and curl up, you may trim the loose edges. Do not remove adhesive strips completely unless your health care provider tells you to do that. °· Check your insertion site every day for signs of infection. Check for: °? Redness, swelling, or pain. °? Fluid or blood. °? Pus or a bad smell. °? Warmth. °· Do not take baths, swim, or use a hot tub until your health care provider approves. °· You may shower 24-48 hours after the procedure, or as directed by your health care provider. °? Remove the dressing and gently wash the site with plain soap and water. °? Pat the area dry with a clean towel. °? Do not rub the site. That could cause bleeding. °· Do not apply powder or lotion to the site. °Activity ° °· For 24 hours after the procedure, or as directed by your health care provider: °? Do not flex or bend the affected arm. °? Do not push or pull heavy objects with the affected arm. °? Do not  drive yourself home from the hospital or clinic. You may drive 24 hours after the procedure unless your health care provider tells you not to. °? Do not operate machinery or power tools. °· Do not lift anything that is heavier than 10 lb (4.5 kg), or the limit that you are told, until your health care provider says that it is safe. °· Ask your health care provider when it is okay to: °? Return to work or school. °? Resume usual physical activities or sports. °? Resume sexual activity. °General instructions °· If the catheter site starts to bleed, raise your arm and put firm pressure on the site. If the bleeding does not stop, get help right away. This is a medical emergency. °· If you went home on the same day as your procedure, a responsible adult should be with you for the first 24 hours after you arrive home. °· Keep all follow-up visits as told by your health care provider. This is important. °Contact a health care provider if: °· You have a fever. °· You have redness, swelling, or yellow drainage around your insertion site. °Get help right away if: °· You have unusual pain at the radial site. °· The catheter insertion area swells very fast. °· The insertion area is bleeding, and the bleeding does not stop when you hold steady pressure on the area. °· Your arm or hand becomes pale, cool, tingly, or numb. °These symptoms may represent a serious problem   that is an emergency. Do not wait to see if the symptoms will go away. Get medical help right away. Call your local emergency services (911 in the [place] Do not drive yourself to the hospital. °Summary °· After the procedure, it is common to have bruising and tenderness at the site. °· Follow instructions from your health care provider about how to take care of your radial site wound. Check the wound every day for signs of infection. °· Do not lift anything that is heavier than 10 lb (4.5 kg), or the limit that you are told, until your health care provider says  that it is safe. °This information is not intended to replace advice given to you by your health care provider. Make sure you discuss any questions you have with your health care provider. °Document Released: 08/18/2010 Document Revised: 08/21/2017 Document Reviewed: 08/21/2017 °Elsevier Interactive Patient Education © 2019 [employer] Inc[employer] ° ° ° °Moderate Conscious Sedation, Adult, Care After °These instructions provide you with information about caring for yourself after your procedure. Your health care provider may also give you more specific instructions. Your treatment has been planned according to current medical practices, but problems sometimes occur. Call your health care provider if you have any problems or questions after your procedure. °What can I expect after the procedure? °After your procedure, it is common: °· To feel sleepy for several hours. °· To feel clumsy and have poor balance for several hours. °· To have poor judgment for several hours. °· To vomit if you eat too soon. °Follow these instructions at home: °For at least 24 hours after the procedure: ° °· Do not: °? Participate in activities where you could fall or become injured. °? Drive. °? Use heavy machinery. °? Drink alcohol. °? Take sleeping pills or medicines that cause drowsiness. °? Make important decisions or sign legal documents. °? Take care of children on your own. °· Rest. °Eating and drinking °· Follow the diet recommended by your health care provider. °· If you vomit: °? Drink water, juice, or soup when you can drink without vomiting. °? Make sure you have little or no nausea before eating solid foods. °General instructions °· Have a responsible adult stay with you until you are awake and alert. °· Take over-the-counter and prescription medicines only as told by your health care provider. °· If you smoke, do not smoke without supervision. °· Keep all follow-up visits as told by your health care provider. This is  important. °Contact a health care provider if: °· You keep feeling nauseous or you keep vomiting. °· You feel light-headed. °· You develop a rash. °· You have a fever. °Get help right away if: °· You have trouble breathing. °This information is not intended to replace advice given to you by your health care provider. Make sure you discuss any questions you have with your health care provider. °Document Released: 05/06/2013 Document Revised: 12/19/2015 Document Reviewed: 11/05/2015 °Elsevier Interactive Patient Education © 2019 Elsevier Inc. ° °

## 2018-07-29 NOTE — CV Procedure (Signed)
   Vascular access via radial using ultrasound guidance.  Widely patent coronary arteries.  Supranormal LVEF greater than 70%.  Elevated LVEDP raising question of hypertrophic left ventricle.  Symptoms of exertional chest tightness suggest angina and could well represent microvascular disease/INOCA.

## 2018-07-29 NOTE — Interval H&P Note (Signed)
Cath Lab Visit (complete for each Cath Lab visit)  Clinical Evaluation Leading to the Procedure:   ACS: Yes.    Non-ACS:    Anginal Classification: CCS III  Anti-ischemic medical therapy: Minimal Therapy (1 class of medications)  Non-Invasive Test Results: No non-invasive testing performed  Prior CABG: No previous CABG      History and Physical Interval Note:  07/29/2018 7:27 AM  Jacob Cannon  has presented today for surgery, with the diagnosis of chest pain  The various methods of treatment have been discussed with the patient and family. After consideration of risks, benefits and other options for treatment, the patient has consented to  Procedure(s): LEFT HEART CATH AND CORONARY ANGIOGRAPHY (N/A) as a surgical intervention .  The patient's history has been reviewed, patient examined, no change in status, stable for surgery.  I have reviewed the patient's chart and labs.  Questions were answered to the patient's satisfaction.     Belva Crome III

## 2019-02-09 ENCOUNTER — Inpatient Hospital Stay (HOSPITAL_COMMUNITY)
Admission: EM | Admit: 2019-02-09 | Discharge: 2019-02-12 | DRG: 316 | Disposition: A | Discharge status: Home / Self Care | Payer: BC Managed Care – PPO | Attending: Internal Medicine | Admitting: Internal Medicine

## 2019-02-09 ENCOUNTER — Other Ambulatory Visit: Payer: Self-pay

## 2019-02-09 ENCOUNTER — Emergency Department (HOSPITAL_BASED_OUTPATIENT_CLINIC_OR_DEPARTMENT_OTHER): Payer: BC Managed Care – PPO

## 2019-02-09 ENCOUNTER — Encounter (HOSPITAL_COMMUNITY): Payer: Self-pay | Admitting: Emergency Medicine

## 2019-02-09 ENCOUNTER — Emergency Department (HOSPITAL_COMMUNITY): Payer: BC Managed Care – PPO

## 2019-02-09 DIAGNOSIS — R072 Precordial pain: Secondary | ICD-10-CM | POA: Diagnosis present

## 2019-02-09 DIAGNOSIS — R0602 Shortness of breath: Secondary | ICD-10-CM

## 2019-02-09 DIAGNOSIS — F17290 Nicotine dependence, other tobacco product, uncomplicated: Secondary | ICD-10-CM | POA: Diagnosis present

## 2019-02-09 DIAGNOSIS — M7989 Other specified soft tissue disorders: Secondary | ICD-10-CM

## 2019-02-09 DIAGNOSIS — K219 Gastro-esophageal reflux disease without esophagitis: Secondary | ICD-10-CM | POA: Diagnosis present

## 2019-02-09 DIAGNOSIS — Z1159 Encounter for screening for other viral diseases: Secondary | ICD-10-CM

## 2019-02-09 DIAGNOSIS — L409 Psoriasis, unspecified: Secondary | ICD-10-CM | POA: Diagnosis present

## 2019-02-09 DIAGNOSIS — R9431 Abnormal electrocardiogram [ECG] [EKG]: Secondary | ICD-10-CM

## 2019-02-09 DIAGNOSIS — Z6833 Body mass index (BMI) 33.0-33.9, adult: Secondary | ICD-10-CM

## 2019-02-09 DIAGNOSIS — I10 Essential (primary) hypertension: Secondary | ICD-10-CM | POA: Diagnosis present

## 2019-02-09 DIAGNOSIS — E781 Pure hyperglyceridemia: Secondary | ICD-10-CM | POA: Diagnosis present

## 2019-02-09 DIAGNOSIS — M1990 Unspecified osteoarthritis, unspecified site: Secondary | ICD-10-CM | POA: Diagnosis present

## 2019-02-09 DIAGNOSIS — Z8241 Family history of sudden cardiac death: Secondary | ICD-10-CM

## 2019-02-09 DIAGNOSIS — R079 Chest pain, unspecified: Secondary | ICD-10-CM | POA: Diagnosis not present

## 2019-02-09 DIAGNOSIS — Z79891 Long term (current) use of opiate analgesic: Secondary | ICD-10-CM

## 2019-02-09 DIAGNOSIS — R0609 Other forms of dyspnea: Secondary | ICD-10-CM | POA: Diagnosis present

## 2019-02-09 DIAGNOSIS — K760 Fatty (change of) liver, not elsewhere classified: Secondary | ICD-10-CM | POA: Diagnosis present

## 2019-02-09 DIAGNOSIS — Z8249 Family history of ischemic heart disease and other diseases of the circulatory system: Secondary | ICD-10-CM

## 2019-02-09 DIAGNOSIS — R7303 Prediabetes: Secondary | ICD-10-CM

## 2019-02-09 DIAGNOSIS — G4733 Obstructive sleep apnea (adult) (pediatric): Secondary | ICD-10-CM | POA: Diagnosis present

## 2019-02-09 DIAGNOSIS — Z833 Family history of diabetes mellitus: Secondary | ICD-10-CM

## 2019-02-09 DIAGNOSIS — R0789 Other chest pain: Secondary | ICD-10-CM | POA: Diagnosis not present

## 2019-02-09 DIAGNOSIS — I421 Obstructive hypertrophic cardiomyopathy: Secondary | ICD-10-CM | POA: Diagnosis not present

## 2019-02-09 DIAGNOSIS — E785 Hyperlipidemia, unspecified: Secondary | ICD-10-CM | POA: Diagnosis present

## 2019-02-09 DIAGNOSIS — Z79899 Other long term (current) drug therapy: Secondary | ICD-10-CM

## 2019-02-09 DIAGNOSIS — E669 Obesity, unspecified: Secondary | ICD-10-CM | POA: Diagnosis present

## 2019-02-09 LAB — BASIC METABOLIC PANEL
Anion gap: 10 (ref 5–15)
BUN: 16 mg/dL (ref 6–20)
CO2: 21 mmol/L — ABNORMAL LOW (ref 22–32)
Calcium: 9.1 mg/dL (ref 8.9–10.3)
Chloride: 108 mmol/L (ref 98–111)
Creatinine, Ser: 1.25 mg/dL — ABNORMAL HIGH (ref 0.61–1.24)
GFR calc Af Amer: >60 mL/min (ref >60)
GFR calc non Af Amer: >60 mL/min (ref >60)
Glucose, Bld: 142 mg/dL — ABNORMAL HIGH (ref 70–99)
Potassium: 4.3 mmol/L (ref 3.5–5.1)
Sodium: 139 mmol/L (ref 135–145)

## 2019-02-09 LAB — CBC
HCT: 50.6 % (ref 39.0–52.0)
Hemoglobin: 16.3 g/dL (ref 13.0–17.0)
MCH: 28.9 pg (ref 26.0–34.0)
MCHC: 32.2 g/dL (ref 30.0–36.0)
MCV: 89.7 fL (ref 80.0–100.0)
Platelets: 273 10*3/uL (ref 150–400)
RBC: 5.64 MIL/uL (ref 4.22–5.81)
RDW: 13.6 % (ref 11.5–15.5)
WBC: 7.4 10*3/uL (ref 4.0–10.5)
nRBC: 0 % (ref 0.0–0.2)

## 2019-02-09 LAB — SARS CORONAVIRUS 2 BY RT PCR (HOSPITAL ORDER, PERFORMED IN ~~LOC~~ HOSPITAL LAB): SARS Coronavirus 2: NEGATIVE

## 2019-02-09 LAB — LIPASE, BLOOD: Lipase: 29 U/L (ref 11–51)

## 2019-02-09 LAB — HEPATIC FUNCTION PANEL
ALT: 20 U/L (ref 0–44)
AST: 28 U/L (ref 15–41)
Albumin: 4.3 g/dL (ref 3.5–5.0)
Alkaline Phosphatase: 62 U/L (ref 38–126)
Bilirubin, Direct: 0.2 mg/dL (ref 0.0–0.2)
Indirect Bilirubin: 0.1 mg/dL — ABNORMAL LOW (ref 0.3–0.9)
Total Bilirubin: 0.3 mg/dL (ref 0.3–1.2)
Total Protein: 7.3 g/dL (ref 6.5–8.1)

## 2019-02-09 LAB — TROPONIN I (HIGH SENSITIVITY)
Troponin I (High Sensitivity): 14 ng/L (ref <18)
Troponin I (High Sensitivity): 18 ng/L — ABNORMAL HIGH (ref <18)
Troponin I (High Sensitivity): 17 ng/L (ref <18)

## 2019-02-09 LAB — D-DIMER, QUANTITATIVE: D-Dimer, Quant: <0.27 ug/mL-FEU (ref 0.00–0.50)

## 2019-02-09 LAB — BRAIN NATRIURETIC PEPTIDE: B Natriuretic Peptide: 24.3 pg/mL (ref 0.0–100.0)

## 2019-02-09 MED ORDER — ENOXAPARIN SODIUM 40 MG/0.4ML ~~LOC~~ SOLN
40.0000 mg | Freq: Every day | SUBCUTANEOUS | Status: DC
  Administered 2019-02-09 – 2019-02-11 (×3): 40 mg via SUBCUTANEOUS
  Filled 2019-02-09 (×3): qty 0.4

## 2019-02-09 MED ORDER — POLYETHYLENE GLYCOL 3350 17 G PO PACK: 17.0000 g | PACK | Freq: Every day | ORAL | Status: DC | PRN

## 2019-02-09 MED ORDER — SODIUM CHLORIDE 0.9% FLUSH
3.0000 mL | Freq: Two times a day (BID) | INTRAVENOUS | Status: DC
  Administered 2019-02-09 – 2019-02-12 (×6): 3 mL via INTRAVENOUS

## 2019-02-09 MED ORDER — SENNA 8.6 MG PO TABS
1.0000 | ORAL_TABLET | Freq: Every day | ORAL | Status: DC
  Administered 2019-02-10 – 2019-02-12 (×3): 8.6 mg via ORAL
  Filled 2019-02-09 (×3): qty 1

## 2019-02-09 MED ORDER — LISINOPRIL 20 MG PO TABS
20.0000 mg | ORAL_TABLET | Freq: Every day | ORAL | Status: DC
  Administered 2019-02-10 – 2019-02-12 (×3): 20 mg via ORAL
  Filled 2019-02-09 (×3): qty 1

## 2019-02-09 MED ORDER — ASPIRIN EC 325 MG PO TBEC
325.0000 mg | DELAYED_RELEASE_TABLET | Freq: Every day | ORAL | Status: DC
  Administered 2019-02-09 – 2019-02-12 (×4): 325 mg via ORAL
  Filled 2019-02-09 (×4): qty 1

## 2019-02-09 MED ORDER — ATORVASTATIN CALCIUM 40 MG PO TABS
80.0000 mg | ORAL_TABLET | Freq: Every day | ORAL | Status: DC
  Administered 2019-02-10 – 2019-02-11 (×2): 80 mg via ORAL
  Filled 2019-02-09 (×2): qty 2

## 2019-02-09 MED ORDER — SODIUM CHLORIDE 0.9 % IV SOLN: 250.0000 mL | INTRAVENOUS | Status: DC | PRN

## 2019-02-09 MED ORDER — SODIUM CHLORIDE 0.9 % IV SOLN
INTRAVENOUS | Status: AC
  Administered 2019-02-09: via INTRAVENOUS

## 2019-02-09 MED ORDER — PANTOPRAZOLE SODIUM 40 MG PO TBEC
40.0000 mg | DELAYED_RELEASE_TABLET | Freq: Every day | ORAL | Status: DC
  Administered 2019-02-10 – 2019-02-12 (×3): 40 mg via ORAL
  Filled 2019-02-09 (×3): qty 1

## 2019-02-09 MED ORDER — ALBUTEROL SULFATE HFA 108 (90 BASE) MCG/ACT IN AERS: 2.0000 | INHALATION_SPRAY | Freq: Four times a day (QID) | RESPIRATORY_TRACT | Status: DC | PRN

## 2019-02-09 MED ORDER — ACETAMINOPHEN 325 MG PO TABS
650.0000 mg | ORAL_TABLET | Freq: Four times a day (QID) | ORAL | Status: DC | PRN
  Administered 2019-02-11: 650 mg via ORAL
  Filled 2019-02-09: qty 2

## 2019-02-09 MED ORDER — HYDROCODONE-ACETAMINOPHEN 5-325 MG PO TABS
1.0000 | ORAL_TABLET | Freq: Four times a day (QID) | ORAL | Status: DC | PRN
  Administered 2019-02-10: 1 via ORAL
  Filled 2019-02-09: qty 1

## 2019-02-09 MED ORDER — SODIUM CHLORIDE 0.9% FLUSH: 3.0000 mL | INTRAVENOUS | Status: DC | PRN

## 2019-02-09 MED ORDER — ALBUTEROL SULFATE (2.5 MG/3ML) 0.083% IN NEBU: 2.5000 mg | INHALATION_SOLUTION | Freq: Four times a day (QID) | RESPIRATORY_TRACT | Status: DC | PRN

## 2019-02-09 MED ORDER — ACETAMINOPHEN 650 MG RE SUPP: 650.0000 mg | Freq: Four times a day (QID) | RECTAL | Status: DC | PRN

## 2019-02-09 MED ORDER — ASPIRIN 81 MG PO CHEW
324.0000 mg | CHEWABLE_TABLET | Freq: Once | ORAL | Status: AC
  Administered 2019-02-09: 324 mg via ORAL
  Filled 2019-02-09: qty 4

## 2019-02-09 MED ORDER — ISOSORBIDE MONONITRATE ER 30 MG PO TB24
30.0000 mg | ORAL_TABLET | Freq: Every day | ORAL | Status: DC
  Administered 2019-02-10 – 2019-02-12 (×3): 30 mg via ORAL
  Filled 2019-02-09 (×3): qty 1

## 2019-02-09 MED ORDER — CARVEDILOL 3.125 MG PO TABS
3.1250 mg | ORAL_TABLET | Freq: Two times a day (BID) | ORAL | Status: DC
  Administered 2019-02-10 – 2019-02-12 (×5): 3.125 mg via ORAL
  Filled 2019-02-09 (×5): qty 1

## 2019-02-09 MED ORDER — TRIAMCINOLONE ACETONIDE 0.5 % EX CREA
1.0000 "application " | TOPICAL_CREAM | Freq: Every day | CUTANEOUS | Status: DC
  Administered 2019-02-10 – 2019-02-12 (×3): 1 via TOPICAL
  Filled 2019-02-09: qty 15

## 2019-02-09 NOTE — ED Notes (Signed)
Patients daughter brought him food and I provided him ginger ale.

## 2019-02-09 NOTE — ED Triage Notes (Signed)
Pt reports that [place] the beach and chest got really tight. Pt took Nitro yesterday and laid down then went away. Reports today he was at work and the more he walked the tighter the chest pains got, took Nitro x 1 today and reports that when got to a cooler area got little better.

## 2019-02-09 NOTE — ED Notes (Signed)
Gave report to Leland, RN for room 1429.

## 2019-02-09 NOTE — ED Notes (Signed)
Dr. Loletha Grayer Tegeler reported patient could have something to eat. Patient is having having a family member bring him something to eat.

## 2019-02-09 NOTE — H&P (Addendum)
TRH H&P    Patient Demographics:    Jacob Cannon, is a 48 y.o. male  MRN: [id number]  DOB - 12/15/1970  Admit Date - 02/09/2019  Referring MD/NP/PA:  Harrell Gave Tegeler  Outpatient Primary MD for the patient is Bernerd Limbo, MD [facility] - Cardiology  Patient coming from:  home  Chief complaint- chest pain   HPI:    Jacob Cannon  is a 48 y.o. male, w hypertension, hyperlipidemia, fatty liver, psoriasis, OSA on cpap, normal cardiac catheterization 07/29/2018, apparently noted some chest discomfort while in [facility].  Pt states that when he got home yesterday and was moving luggage back into the house he had a brief episode of substernal chest pain "tightness" without radiation.  Pt took slg nitro with relief after about 20 minutes.  Pt this morning again had chest tightness at about 9 am.  Pt states lasted for about 1 hour 30 minutes til took slg nitro and had relief after about 15 minutes,  Slightly diaphoretic,  Pt was concerned about the severity of the discomfort and therefore presented to ED for evaluation.  Pt points to area just left of sternum and states hurts with palpation.  "it feels like something is just moving in his chest"  Pt denies fever, chills, palp, sob, n/v, heartburn, abd pain, diarrhea, brbpr, dysuria.   In ED,  T 97.9  P 103  R 18, Bp 129/83  Pox 100% on RA  Na 139, K 4.3,  Bun 16, Creatinine 1.25 Ast 28, Alt 20 Lipase 29 Wbc 7.4, Hgb 16.3, Plt 273  D dimer <0.27 BNP 24.3  Trop 14-> 18  Covid 19 negative   EKG nsr at 100 RAD,  Q in v1, v2, ST elevation in v1, v2, ST depression  2, 3, AVF, and v4-6  (similar changes on EKG 07/21/2018)  ED spoke with cardiology who recommended admission  Pt will be admitted for chest pain   Review of systems:    In addition to the HPI above,  No Fever-chills, No Headache, No changes with Vision or hearing, No problems  swallowing food or Liquids, No Cough or Shortness of Breath, No Abdominal pain, No Nausea or Vomiting, bowel movements are regular, No Blood in stool or Urine, No dysuria, No new skin rashes or bruises, No new joints pains-aches,  No new weakness, tingling, numbness in any extremity, No recent weight gain or loss, No polyuria, polydypsia or polyphagia, No significant Mental Stressors.  All other systems reviewed and are negative.    Past History of the following :    Past Medical History:  Diagnosis Date   Arthritis    type?   Cardiomegaly 2010   [facility]   Chest pain Chronic   GERD (gastroesophageal reflux disease)    Heart murmur congenital   Hepatic steatosis    Hyperlipidemia    Hypertension    OSA on CPAP 2010   Psoriasis    used to see derm    Sleep apnea       Past  Surgical History:  Procedure Laterality Date   CARDIAC CATHETERIZATION     ESOPHAGOGASTRODUODENOSCOPY  09/13/2011   Procedure: ESOPHAGOGASTRODUODENOSCOPY (EGD);  Surgeon: Estanislado Emms., MD,FACG;  Location: [facility];  Service: Endoscopy;  Laterality: N/A;   INGUINAL HERNIA REPAIR Right    LEFT HEART CATH AND CORONARY ANGIOGRAPHY N/A 07/29/2018   Procedure: LEFT HEART CATH AND CORONARY ANGIOGRAPHY;  Surgeon: Belva Crome, MD;  Location: [facility];  Service: Cardiovascular;  Laterality: N/A;      Social History:      Social History   Tobacco Use   Smoking status: Former Smoker    Packs/day: 2.00    Types: Cigars    Quit date: 04/19/2001    Years since quitting: 17.8   Smokeless tobacco: Never Used  Substance Use Topics   Alcohol use: Yes    Comment: wine- occasionally       Family History :     Family History  Problem Relation Age of Onset   Multiple sclerosis Mother    Diabetes Mother    Heart attack Father    Diabetes Brother    Heart disease Maternal Grandmother    Diabetes Maternal Uncle    Colon cancer Neg Hx     Prostate cancer Neg Hx       Home Medications:   Prior to Admission medications   Medication Sig Start Date End Date Taking? Authorizing Provider  albuterol (VENTOLIN HFA) 108 (90 Base) MCG/ACT inhaler Inhale 2 puffs into the lungs every 6 (six) hours as needed for wheezing or shortness of breath.  11/18/18  Yes [provider]  chlorpheniramine-HYDROcodone (TUSSIONEX) 10-8 MG/5ML SUER Take 5 mLs by mouth every 12 (twelve) hours as needed for cough.  11/27/18  Yes [provider]  guaiFENesin (MUCINEX PO) Take 1 tablet by mouth daily.   Yes [provider]  lisinopril (PRINIVIL,ZESTRIL) 40 MG tablet Take 1 tablet (40 mg total) by mouth daily. NEED OV. 07/22/17  Yes Crenshaw, Denice Bors, MD  nitroGLYCERIN (NITROSTAT) 0.3 MG SL tablet Place 1 tablet under the tongue every 5 (five) minutes as needed for chest pain.  07/21/18  Yes [provider]  pantoprazole (PROTONIX) 40 MG tablet Take 1 tablet (40 mg total) by mouth daily with breakfast. Patient taking differently: Take 40 mg by mouth daily.  04/06/16  Yes Esterwood, Amy S, PA-C  senna (SENOKOT) 8.6 MG tablet Take 1 tablet by mouth daily.   Yes [provider]  triamcinolone cream (KENALOG) 0.5 % APPLY TO AFFECTED AREA TWICE A DAY Patient taking differently: Apply 1 application topically daily.  10/22/16  Yes Jeffery, Domingo Mend, PA  HYDROcodone-acetaminophen (NORCO/VICODIN) 5-325 MG tablet Take 1 tablet by mouth every 4 (four) hours as needed. Patient taking differently: Take 1 tablet by mouth every 6 (six) hours as needed for moderate pain.  01/19/17   Evalee Jefferson, PA-C  polyethylene glycol powder (GLYCOLAX/MIRALAX) powder Take 17 g by mouth daily as needed for moderate constipation.     [provider]     Allergies:     Allergies  Allergen Reactions   Methocarbamol     Chest pain     Physical Exam:   Vitals  Blood pressure (!) 150/90, pulse 76, temperature 98.6 F (37 C), temperature  source Oral, resp. rate (!) 23, SpO2 96 %.  1.  General: axox3  2. Psychiatric: euthymic  3. Neurologic: cn2-12 intact, reflexes 2+ symmetric, diffuse with no clonus, motor 5/5 in all 4  ext  4. HEENMT:  Anicteric, pupils 1.66mm symmetric, direct, consensual intact Neck: no jvd, no bruit  5. Respiratory : CTAB  6. Cardiovascular : rrr s1. S2. No m/g/r  7. Gastrointestinal:  Abd: soft, nt, nd, +bs  8. Skin:  Ext: no c/c/e,  No rash  9.Musculoskeletal:  Good ROM  No adenopathy    Data Review:    CBC Recent Labs  Lab 02/09/19 1234  WBC 7.4  HGB 16.3  HCT 50.6  PLT 273  MCV 89.7  MCH 28.9  MCHC 32.2  RDW 13.6   ------------------------------------------------------------------------------------------------------------------  Results for orders placed or performed during the hospital encounter of 02/09/19 (from the past 48 hour(s))  Basic metabolic panel     Status: Abnormal   Collection Time: 02/09/19 12:34 PM  Result Value Ref Range   Sodium 139 135 - 145 mmol/L   Potassium 4.3 3.5 - 5.1 mmol/L   Chloride 108 98 - 111 mmol/L   CO2 21 (L) 22 - 32 mmol/L   Glucose, Bld 142 (H) 70 - 99 mg/dL   BUN 16 6 - 20 mg/dL   Creatinine, Ser 1.25 (H) 0.61 - 1.24 mg/dL   Calcium 9.1 8.9 - 10.3 mg/dL   GFR calc non Af Amer >60 >60 mL/min   GFR calc Af Amer >60 >60 mL/min   Anion gap 10 5 - 15    Comment: Performed at [facility], [place] [address] [place], [place] [address]  CBC     Status: None   Collection Time: 02/09/19 12:34 PM  Result Value Ref Range   WBC 7.4 4.0 - 10.5 K/uL   RBC 5.64 4.22 - 5.81 MIL/uL   Hemoglobin 16.3 13.0 - 17.0 g/dL   HCT 50.6 39.0 - 52.0 %   MCV 89.7 80.0 - 100.0 fL   MCH 28.9 26.0 - 34.0 pg   MCHC 32.2 30.0 - 36.0 g/dL   RDW 13.6 11.5 - 15.5 %   Platelets 273 150 - 400 K/uL   nRBC 0.0 0.0 - 0.2 %    Comment: Performed at [facility], [place] [address] [place], [place] [address]  Troponin I  (High Sensitivity)     Status: None   Collection Time: 02/09/19 12:34 PM  Result Value Ref Range   Troponin I (High Sensitivity) 14 <18 ng/L    Comment: (NOTE) Elevated high sensitivity troponin I (hsTnI) values and significant  changes across serial measurements may suggest ACS but many other  chronic and acute conditions are known to elevate hsTnI results.  Refer to the "Links" section for chest pain algorithms and additional  guidance. Performed at [facility], [place] [address], [place], [place] [address]   Hepatic function panel     Status: Abnormal   Collection Time: 02/09/19 12:34 PM  Result Value Ref Range   Total Protein 7.3 6.5 - 8.1 g/dL   Albumin 4.3 3.5 - 5.0 g/dL   AST 28 15 - 41 U/L   ALT 20 0 - 44 U/L   Alkaline Phosphatase 62 38 - 126 U/L   Total Bilirubin 0.3 0.3 - 1.2 mg/dL   Bilirubin, Direct 0.2 0.0 - 0.2 mg/dL   Indirect Bilirubin 0.1 (L) 0.3 - 0.9 mg/dL    Comment: Performed at [facility], [place] [address] [place], [place] [address]  Lipase, blood     Status: None   Collection Time: 02/09/19 12:34 PM  Result Value Ref Range   Lipase  29 11 - 51 U/L    Comment: Performed at [facility], [place] [address] [place], [place] [address]  D-dimer, quantitative (not at [facility])     Status: None   Collection Time: 02/09/19 12:34 PM  Result Value Ref Range   D-Dimer, Quant <0.27 0.00 - 0.50 ug/mL-FEU    Comment: (NOTE) At the manufacturer cut-off of 0.50 ug/mL FEU, this assay has been documented to exclude PE with a sensitivity and negative predictive value of 97 to 99%.  At this time, this assay has not been approved by the FDA to exclude DVT/VTE. Results should be correlated with clinical presentation. Performed at [facility], [place] [address] [place], [place] [address]   Brain natriuretic peptide     Status: None   Collection Time: 02/09/19 12:34 PM  Result Value Ref Range   B  Natriuretic Peptide 24.3 0.0 - 100.0 pg/mL    Comment: Performed at [facility], [place] [address] [place], [place] [address]  SARS Coronavirus 2 (CEPHEID - Performed in [place] hospital lab), Hosp Order     Status: None   Collection Time: 02/09/19  3:39 PM   Specimen: Nasopharyngeal Swab  Result Value Ref Range   SARS Coronavirus 2 NEGATIVE NEGATIVE    Comment: (NOTE) If result is NEGATIVE SARS-CoV-2 target nucleic acids are NOT DETECTED. The SARS-CoV-2 RNA is generally detectable in upper and lower  respiratory specimens during the acute phase of infection. The lowest  concentration of SARS-CoV-2 viral copies this assay can detect is 250  copies / mL. A negative result does not preclude SARS-CoV-2 infection  and should not be used as the sole basis for treatment or other  patient management decisions.  A negative result may occur with  improper specimen collection / handling, submission of specimen other  than nasopharyngeal swab, presence of viral mutation(s) within the  areas targeted by this assay, and inadequate number of viral copies  (<250 copies / mL). A negative result must be combined with clinical  observations, patient history, and epidemiological information. If result is POSITIVE SARS-CoV-2 target nucleic acids are DETECTED. The SARS-CoV-2 RNA is generally detectable in upper and lower  respiratory specimens dur ing the acute phase of infection.  Positive  results are indicative of active infection with SARS-CoV-2.  Clinical  correlation with patient history and other diagnostic information is  necessary to determine patient infection status.  Positive results do  not rule out bacterial infection or co-infection with other viruses. If result is PRESUMPTIVE POSTIVE SARS-CoV-2 nucleic acids MAY BE PRESENT.   A presumptive positive result was obtained on the submitted specimen  and confirmed on repeat testing.  While 2019 novel coronavirus    (SARS-CoV-2) nucleic acids may be present in the submitted sample  additional confirmatory testing may be necessary for epidemiological  and / or clinical management purposes  to differentiate between  SARS-CoV-2 and other Sarbecovirus currently known to infect humans.  If clinically indicated additional testing with an alternate test  methodology [phone]) is advised. The SARS-CoV-2 RNA is generally  detectable in upper and lower respiratory sp ecimens during the acute  phase of infection. The expected result is Negative. Fact Sheet for Patients:  StrictlyIdeas.no Fact Sheet for Healthcare Providers: BankingDealers.co.za This test is not yet approved or cleared by the [place] FDA and has been authorized for detection and/or diagnosis of SARS-CoV-2 by FDA under an Emergency Use Authorization (EUA).  This EUA will remain in effect (  meaning this test can be used) for the duration of the COVID-19 declaration under Section 564(b)(1) of the Act, 21 U.S.C. section 360bbb-3(b)(1), unless the authorization is terminated or revoked sooner. Performed at [facility], [place] [address], [place], [place] [address]   Troponin I (High Sensitivity)     Status: Abnormal   Collection Time: 02/09/19  5:58 PM  Result Value Ref Range   Troponin I (High Sensitivity) 18.0 (H) <18 ng/L    Comment: (NOTE) Elevated high sensitivity troponin I (hsTnI) values and significant  changes across serial measurements may suggest ACS but many other  chronic and acute conditions are known to elevate hsTnI results.  Refer to the "Links" section for chest pain algorithms and additional  guidance. Performed at [facility], [place] [address], [place], [place] [address]   Troponin I (High Sensitivity)     Status: None   Collection Time: 02/09/19  9:32 PM  Result Value Ref Range   Troponin I (High Sensitivity) 17.0 <18 ng/L     Comment: (NOTE) Elevated high sensitivity troponin I (hsTnI) values and significant  changes across serial measurements may suggest ACS but many other  chronic and acute conditions are known to elevate hsTnI results.  Refer to the "Links" section for chest pain algorithms and additional  guidance. Performed at [facility], [place] [address], [place], [place] [address]     Chemistries  Recent Labs  Lab 02/09/19 1234  NA 139  K 4.3  CL 108  CO2 21*  GLUCOSE 142*  BUN 16  CREATININE 1.25*  CALCIUM 9.1  AST 28  ALT 20  ALKPHOS 62  BILITOT 0.3   ------------------------------------------------------------------------------------------------------------------  ------------------------------------------------------------------------------------------------------------------ GFR: CrCl cannot be calculated (Unknown ideal weight.). Liver Function Tests: Recent Labs  Lab 02/09/19 1234  AST 28  ALT 20  ALKPHOS 62  BILITOT 0.3  PROT 7.3  ALBUMIN 4.3   Recent Labs  Lab 02/09/19 1234  LIPASE 29   No results for input(s): AMMONIA in the last 168 hours. Coagulation Profile: No results for input(s): INR, PROTIME in the last 168 hours. Cardiac Enzymes: No results for input(s): CKTOTAL, CKMB, CKMBINDEX, TROPONINI in the last 168 hours. BNP (last 3 results) No results for input(s): PROBNP in the last 8760 hours. HbA1C: No results for input(s): HGBA1C in the last 72 hours. CBG: No results for input(s): GLUCAP in the last 168 hours. Lipid Profile: No results for input(s): CHOL, HDL, LDLCALC, TRIG, CHOLHDL, LDLDIRECT in the last 72 hours. Thyroid Function Tests: No results for input(s): TSH, T4TOTAL, FREET4, T3FREE, THYROIDAB in the last 72 hours. Anemia Panel: No results for input(s): VITAMINB12, FOLATE, FERRITIN, TIBC, IRON, RETICCTPCT in the last 72  hours.  --------------------------------------------------------------------------------------------------------------- Urine analysis:    Component Value Date/Time   COLORURINE YELLOW 09/11/2011 1125   APPEARANCEUR CLEAR 09/11/2011 1125   LABSPEC 1.015 09/11/2011 1125   PHURINE 7.5 09/11/2011 1125   GLUCOSEU NEGATIVE 09/11/2011 1125   HGBUR NEGATIVE 09/11/2011 1125   BILIRUBINUR neg 10/30/2013 [place] 09/11/2011 1125   PROTEINUR 100 10/30/2013 1653   PROTEINUR NEGATIVE 09/11/2011 1125   UROBILINOGEN 1.0 10/30/2013 1653   UROBILINOGEN 0.2 09/11/2011 1125   NITRITE neg 10/30/2013 1653   NITRITE NEGATIVE 09/11/2011 1125   LEUKOCYTESUR Negative 10/30/2013 1653      Imaging Results:    Dg Chest 2 View  Result Date: 02/09/2019 CLINICAL DATA:  Chest pain EXAM: CHEST - 2 VIEW COMPARISON:  July 22, 2018  FINDINGS: Lungs are clear. Heart size and pulmonary vascularity are normal. No adenopathy. No pneumothorax. No bone lesions. IMPRESSION: No edema or consolidation. Electronically Signed   By: Lowella Grip III M.D.   On: 02/09/2019 13:04   Vas [place] Lower Extremity Venous (dvt) (only [facility])  Result Date: 02/09/2019  Lower Venous Study Indications: Swelling, Chest pain, and SOB.  Comparison Study: 03/27/2014 Left lower extremity negative Performing Technologist: Toma Copier RVS  Examination Guidelines: A complete evaluation includes B-mode imaging, spectral Doppler, color Doppler, and power Doppler as needed of all accessible portions of each vessel. Bilateral testing is considered an integral part of a complete examination. Limited examinations for reoccurring indications may be performed as noted.  +-----+---------------+---------+-----------+----------+-------+  RIGHT Compressibility Phasicity Spontaneity Properties Summary  +-----+---------------+---------+-----------+----------+-------+  CFV   Full            Yes       Yes                              +-----+---------------+---------+-----------+----------+-------+  SFJ   Full                                                      +-----+---------------+---------+-----------+----------+-------+   +---------+---------------+---------+-----------+----------+------------------+  LEFT      Compressibility Phasicity Spontaneity Properties Summary             +---------+---------------+---------+-----------+----------+------------------+  CFV       Full            Yes       Yes                                        +---------+---------------+---------+-----------+----------+------------------+  SFJ       Full                                                                 +---------+---------------+---------+-----------+----------+------------------+  FV Prox   Full            Yes       Yes                                        +---------+---------------+---------+-----------+----------+------------------+  FV Mid    Full                                                                 +---------+---------------+---------+-----------+----------+------------------+  FV Distal Full            Yes       Yes                                        +---------+---------------+---------+-----------+----------+------------------+  PFV       Full            Yes       Yes                                        +---------+---------------+---------+-----------+----------+------------------+  POP       Full            Yes       Yes                                        +---------+---------------+---------+-----------+----------+------------------+  PTV       Full                                                                 +---------+---------------+---------+-----------+----------+------------------+  PERO                                                       Difficult to image  +---------+---------------+---------+-----------+----------+------------------+     Summary: Right: There is no evidence of common femoral vein  obstruction. Left: Findings appear essentially unchanged compared to previous examination. There is no evidence of deep vein thrombosis in the lower extremity. No cystic structure found in the popliteal fossa.  *See table(s) above for measurements and observations. Electronically signed by Monica Martinez MD on 02/09/2019 at 5:28:47 PM.    Final        Assessment & Plan:    Active Problems:   Chest pain  Chest pain  ? Esophageal spasm , since CP responsive to nitro, or costochondritis Tele Trop I q2h x2 Check cardiac echo Check hga1c, lipid Check urine drug screen Start Aspirin 325mg  po qday Start Carvedilol 3.125mg  po bid Start Lipitor 80mg  po qhs Start Imdur 30mg  po qday Cont Lisinopril 40=> 20mg  po qday Cardiology consult requested by email   Jerrye Bushy Cont PPI  Psoriasis Recommended that he see rheumatologist/ dermatology for biologic   DVT Prophylaxis-   Lovenox - SCDs   AM Labs Ordered, also please review Full Orders  Family Communication: Admission, patients condition and plan of care including tests being ordered have been discussed with the patient who indicate understanding and agree with the plan and Code Status.  Code Status:  FULL CODE, left message on answering machine for wife that patient admitted to hospital for chest pain  Admission status: Observation: Based on patients clinical presentation and evaluation of above clinical data, I have made determination that patient meets observation criteria at this time.  Time spent in minutes : 70    Jani Gravel M.D on 02/09/2019 at 11:01 PM

## 2019-02-09 NOTE — Progress Notes (Signed)
Left lower extremity venous duplex completed. Preliminary results in Chart review CV Proc. 02/09/2019, 4:05 PM

## 2019-02-09 NOTE — ED Provider Notes (Signed)
MSE was initiated and I personally evaluated the patient and placed orders (if any) at  2:54 PM on February 09, 2019.  He has had 3 episodes of chest pain characterized as "tightness,", with sweating, usually with exertion over the last several days.  He used nitroglycerin last night and today, with relief.  Currently he is pain-free.  Each episode of pain lasted about 30 minutes.  He has not seen his cardiologist recently.  He states he has never had a cardiac stent.  Patient will evaluation is reassuring.  He will require delta troponin.  The patient appears stable so that the remainder of the MSE may be completed by another provider.   Daleen Bo, MD 02/09/19 [phone]

## 2019-02-09 NOTE — ED Provider Notes (Signed)
[facility] DEPT Provider Note   CSN: [id number] Arrival date & time: 02/09/19  1214     History   Chief Complaint Chief Complaint  Patient presents with   Chest Pain   Shortness of Breath    HPI Jacob Cannon is a 48 y.o. male.     The history is provided by the patient and medical records. No language interpreter was used.  Chest Pain Pain location:  L chest Pain quality: crushing, dull, pressure and radiating   Pain radiates to:  L shoulder and L arm Pain severity:  Severe Onset quality:  Gradual Duration:  2 days Timing:  Intermittent Progression:  Waxing and waning Chronicity:  New Relieved by:  Rest and nitroglycerin Worsened by:  Exertion and deep breathing Ineffective treatments:  None tried Associated symptoms: cough, diaphoresis, lower extremity edema (resolved) and shortness of breath   Associated symptoms: no abdominal pain, no back pain, no fatigue, no fever, no nausea, no palpitations, no syncope and no vomiting   Risk factors: hypertension, male sex and obesity     Past Medical History:  Diagnosis Date   Arthritis    type?   Cardiomegaly 2010   [facility]   Chest pain Chronic   GERD (gastroesophageal reflux disease)    Heart murmur congenital   Hepatic steatosis    Hyperlipidemia    Hypertension    OSA on CPAP 2010   Psoriasis    used to see derm    Sleep apnea     Patient Active Problem List   Diagnosis Date Noted   Angina pectoris ([place]) 07/22/2018   Gastroesophageal reflux disease without esophagitis 12/13/2017   History of pancreatitis 12/13/2017   Acute pancreatitis 03/15/2016   Atrophic testicle 10/30/2013   Unspecified gastritis and gastroduodenitis without mention of hemorrhage 09/13/2011   Abdominal pain, right upper quadrant 09/12/2011   Chest pain 04/20/2011   Cough 01/15/2011   General medical examination 01/12/2011   Essential hypertension    Psoriasis      Heart murmur    OSA on CPAP    Cardiomegaly     Past Surgical History:  Procedure Laterality Date   CARDIAC CATHETERIZATION     ESOPHAGOGASTRODUODENOSCOPY  09/13/2011   Procedure: ESOPHAGOGASTRODUODENOSCOPY (EGD);  Surgeon: Estanislado Emms., MD,FACG;  Location: [facility];  Service: Endoscopy;  Laterality: N/A;   INGUINAL HERNIA REPAIR Right    LEFT HEART CATH AND CORONARY ANGIOGRAPHY N/A 07/29/2018   Procedure: LEFT HEART CATH AND CORONARY ANGIOGRAPHY;  Surgeon: Belva Crome, MD;  Location: [facility];  Service: Cardiovascular;  Laterality: N/A;        Home Medications    Prior to Admission medications   Medication Sig Start Date End Date Taking? Authorizing Provider  HYDROcodone-acetaminophen (NORCO/VICODIN) 5-325 MG tablet Take 1 tablet by mouth every 4 (four) hours as needed. Patient taking differently: Take 1 tablet by mouth every 6 (six) hours as needed for moderate pain.  01/19/17   Evalee Jefferson, PA-C  lisinopril (PRINIVIL,ZESTRIL) 40 MG tablet Take 1 tablet (40 mg total) by mouth daily. NEED OV. 07/22/17   Lelon Perla, MD  nitroGLYCERIN (NITROSTAT) 0.3 MG SL tablet Place 1 tablet under the tongue every 5 (five) minutes as needed for chest pain.  07/21/18   [provider]  pantoprazole (PROTONIX) 40 MG tablet Take 1 tablet (40 mg total) by mouth daily with breakfast. Patient taking differently: Take 40 mg by mouth at bedtime.  04/06/16  Esterwood, Amy S, PA-C  polyethylene glycol powder (GLYCOLAX/MIRALAX) powder Take 17 g by mouth daily as needed for moderate constipation.     [provider]  triamcinolone cream (KENALOG) 0.5 % APPLY TO AFFECTED AREA TWICE A DAY Patient taking differently: Apply 1 application topically daily as needed (eczema).  10/22/16   Harrison Mons, PA    Family History Family History  Problem Relation Age of Onset   Multiple sclerosis Mother    Diabetes Mother    Heart attack Father    Diabetes  Brother    Heart disease Maternal Grandmother    Diabetes Maternal Uncle    Colon cancer Neg Hx    Prostate cancer Neg Hx     Social History Social History   Tobacco Use   Smoking status: Former Smoker    Packs/day: 2.00    Types: Cigars    Quit date: 04/19/2001    Years since quitting: 17.8   Smokeless tobacco: Never Used  Substance Use Topics   Alcohol use: Yes    Comment: wine- occasionally   Drug use: No     Allergies   Methocarbamol   Review of Systems Review of Systems  Constitutional: Positive for diaphoresis. Negative for chills, fatigue and fever.  HENT: Negative for congestion.   Respiratory: Positive for cough and shortness of breath. Negative for chest tightness, wheezing and stridor.   Cardiovascular: Positive for chest pain and leg swelling (resolved now). Negative for palpitations and syncope.  Gastrointestinal: Negative for abdominal pain, constipation, diarrhea, nausea and vomiting.  Genitourinary: Negative for flank pain and frequency.  Musculoskeletal: Negative for back pain, neck pain and neck stiffness.  Skin: Negative for rash and wound.  Neurological: Negative for light-headedness.  Psychiatric/Behavioral: Negative for agitation.  All other systems reviewed and are negative.    Physical Exam Updated Vital Signs BP 122/79    Pulse 83    Temp 97.9 F (36.6 C) (Oral)    Resp 16    SpO2 98%   Physical Exam Vitals signs and nursing note reviewed.  Constitutional:      General: He is not in acute distress.    Appearance: He is well-developed. He is not ill-appearing, toxic-appearing or diaphoretic.  HENT:     Head: Normocephalic and atraumatic.  Eyes:     Conjunctiva/sclera: Conjunctivae normal.     Pupils: Pupils are equal, round, and reactive to light.  Neck:     Musculoskeletal: Neck supple.  Cardiovascular:     Rate and Rhythm: Regular rhythm. Tachycardia present.     Heart sounds: No murmur.  Pulmonary:     Effort:  Pulmonary effort is normal. No respiratory distress.     Breath sounds: Normal breath sounds. No decreased breath sounds, wheezing, rhonchi or rales.  Chest:     Chest wall: Tenderness present.  Abdominal:     Palpations: Abdomen is soft.     Tenderness: There is no abdominal tenderness.  Musculoskeletal:     Right lower leg: He exhibits no tenderness. No edema.     Left lower leg: He exhibits no tenderness. No edema.  Skin:    General: Skin is warm and dry.     Capillary Refill: Capillary refill takes less than 2 seconds.  Neurological:     General: No focal deficit present.     Mental Status: He is alert.  Psychiatric:        Mood and Affect: Mood normal.      ED Treatments / Results  Labs (all labs ordered are listed, but only abnormal results are displayed) Labs Reviewed  BASIC METABOLIC PANEL - Abnormal; Notable for the following components:      Result Value   CO2 21 (*)    Glucose, Bld 142 (*)    Creatinine, Ser 1.25 (*)    All other components within normal limits  HEPATIC FUNCTION PANEL - Abnormal; Notable for the following components:   Indirect Bilirubin 0.1 (*)    All other components within normal limits  TROPONIN I (HIGH SENSITIVITY) - Abnormal; Notable for the following components:   Troponin I (High Sensitivity) 18.0 (*)    All other components within normal limits  SARS CORONAVIRUS 2 (HOSPITAL ORDER, PERFORMED IN [facility])  CBC  LIPASE, BLOOD  D-DIMER, QUANTITATIVE (NOT AT [facility])  BRAIN NATRIURETIC PEPTIDE  HIV ANTIBODY (ROUTINE TESTING W REFLEX)  COMPREHENSIVE METABOLIC PANEL  CBC  HEMOGLOBIN A1C  LIPID PANEL  TROPONIN I (HIGH SENSITIVITY)  TROPONIN I (HIGH SENSITIVITY)  TROPONIN I (HIGH SENSITIVITY)    EKG EKG Interpretation  Date/Time:  Monday February 09 2019 12:22:36 EDT Ventricular Rate:  100 PR Interval:    QRS Duration: 88 QT Interval:  343 QTC Calculation: 443 R Axis:   101 Text Interpretation:  Sinus tachycardia  Probable RVH w/ secondary repol abnormality Repol abnrm suggests ischemia, lateral leads Minimal ST elevation, anterior leads since last tracing no significant change Confirmed by Daleen Bo [phone]) on 02/09/2019 12:28:48 PM Also confirmed by Daleen Bo [phone]), editor Philomena Doheny [phone])  on 02/09/2019 1:54:49 PM   Radiology Dg Chest 2 View  Result Date: 02/09/2019 CLINICAL DATA:  Chest pain EXAM: CHEST - 2 VIEW COMPARISON:  July 22, 2018 FINDINGS: Lungs are clear. Heart size and pulmonary vascularity are normal. No adenopathy. No pneumothorax. No bone lesions. IMPRESSION: No edema or consolidation. Electronically Signed   By: Lowella Grip III M.D.   On: 02/09/2019 13:04   Vas [place] Lower Extremity Venous (dvt) (only [facility])  Result Date: 02/09/2019  Lower Venous Study Indications: Swelling, Chest pain, and SOB.  Comparison Study: 03/27/2014 Left lower extremity negative Performing Technologist: Toma Copier RVS  Examination Guidelines: A complete evaluation includes B-mode imaging, spectral Doppler, color Doppler, and power Doppler as needed of all accessible portions of each vessel. Bilateral testing is considered an integral part of a complete examination. Limited examinations for reoccurring indications may be performed as noted.  +-----+---------------+---------+-----------+----------+-------+  RIGHT Compressibility Phasicity Spontaneity Properties Summary  +-----+---------------+---------+-----------+----------+-------+  CFV   Full            Yes       Yes                             +-----+---------------+---------+-----------+----------+-------+  SFJ   Full                                                      +-----+---------------+---------+-----------+----------+-------+   +---------+---------------+---------+-----------+----------+------------------+  LEFT      Compressibility Phasicity Spontaneity Properties Summary              +---------+---------------+---------+-----------+----------+------------------+  CFV       Full            Yes       Yes                                        +---------+---------------+---------+-----------+----------+------------------+  SFJ       Full                                                                 +---------+---------------+---------+-----------+----------+------------------+  FV Prox   Full            Yes       Yes                                        +---------+---------------+---------+-----------+----------+------------------+  FV Mid    Full                                                                 +---------+---------------+---------+-----------+----------+------------------+  FV Distal Full            Yes       Yes                                        +---------+---------------+---------+-----------+----------+------------------+  PFV       Full            Yes       Yes                                        +---------+---------------+---------+-----------+----------+------------------+  POP       Full            Yes       Yes                                        +---------+---------------+---------+-----------+----------+------------------+  PTV       Full                                                                 +---------+---------------+---------+-----------+----------+------------------+  PERO                                                       Difficult to image  +---------+---------------+---------+-----------+----------+------------------+     Summary: Right: There is no evidence of common femoral vein obstruction. Left: Findings appear essentially unchanged compared to previous examination. There is no evidence of deep vein thrombosis in the lower extremity. No cystic structure found in the popliteal fossa.  *See table(s) above for measurements and observations. Electronically signed by Harrell Gave  Clark MD on 02/09/2019 at 5:28:47 PM.    Final      Procedures Procedures (including critical care time)  Medications Ordered in ED Medications  enoxaparin (LOVENOX) injection 40 mg (40 mg Subcutaneous Given 02/09/19 2300)  sodium chloride flush (NS) 0.9 % injection 3 mL (3 mLs Intravenous Given 02/09/19 2342)  sodium chloride flush (NS) 0.9 % injection 3 mL (has no administration in time range)  0.9 %  sodium chloride infusion (has no administration in time range)  acetaminophen (TYLENOL) tablet 650 mg (has no administration in time range)    Or  acetaminophen (TYLENOL) suppository 650 mg (has no administration in time range)  0.9 %  sodium chloride infusion ( Intravenous New Bag/Given 02/09/19 2334)  atorvastatin (LIPITOR) tablet 80 mg (has no administration in time range)  aspirin EC tablet 325 mg (325 mg Oral Given 02/09/19 2339)  carvedilol (COREG) tablet 3.125 mg (has no administration in time range)  lisinopril (ZESTRIL) tablet 20 mg (has no administration in time range)  isosorbide mononitrate (IMDUR) 24 hr tablet 30 mg (has no administration in time range)  HYDROcodone-acetaminophen (NORCO/VICODIN) 5-325 MG per tablet 1 tablet (has no administration in time range)  pantoprazole (PROTONIX) EC tablet 40 mg (has no administration in time range)  polyethylene glycol (MIRALAX / GLYCOLAX) packet 17 g (has no administration in time range)  senna (SENOKOT) tablet 8.6 mg (has no administration in time range)  triamcinolone cream (KENALOG) 0.5 % 1 application (has no administration in time range)  albuterol (PROVENTIL) (2.5 MG/3ML) 0.083% nebulizer solution 2.5 mg (has no administration in time range)  aspirin chewable tablet 324 mg (324 mg Oral Given 02/09/19 1456)     Initial Impression / Assessment and Plan / ED Course  I have reviewed the triage vital signs and the nursing notes.  Pertinent labs & imaging results that were available during my care of the patient were reviewed by me and considered in my medical decision making (see  chart for details).        Jacob Cannon is a 48 y.o. male with a past medical history significant for hypertension, psoriasis, prior pancreatitis, hyperlipidemia, cardiomegaly, who presents with chest pain and shortness of breath.  Patient reports that he had unilateral left leg pain and swelling last week after driving to [facility].  He reports that he came home yesterday and for the last several days has been having exertional chest pain and shortness of breath.  He reports that he took nitro both last night and this morning which helped his pain.  Is worse was 10 out of 10 and is a pressure and tightness on his left chest.  He reports he gets extremely diaphoretic when the symptoms worsen.  He reports he is currently chest pain-free.  He denies any current leg pain or leg swelling.  No history of DVT or PE.  He denies any trauma.  Chart review shows the patient had a heart catheterization in December with Daneen Schick and did not show significant coronary disease but did show some abnormalities with ventricular function.  He reports his discomfort is both pleuritic and exertional.  He reports the pain radiates into his left arm and left shoulder.  He denies any fevers, chills but does report a mild cough.  No nausea, vomiting, urinary symptoms or GI symptoms.  On exam, chest is slightly tender in the left chest but otherwise had no tenderness.  Lungs were clear.  Symmetric pulses in upper lower extremities.  Patient was tachycardic on arrival to the emergency department.  Heart score calculated as a 5.  Will rule out for cardiac etiology as well as a DVT/PE with his recent travel with tachycardia and arrival.  Anticipate reassessment for work-up.  Will send coronavirus test given [place], his shortness of breath, chest pain, and the cough.  DVT study negative.  D-dimer negative.  Coronavirus test negative.  Troponin rising from 14-18 while in the emergency department.   Chest x-ray shows no pneumonia or other abnormality.  Patient remains chest pain-free however concerned about his concerning chest pain story.  Spoke with cardiology who agreed he should be admitted to continue trending troponins and monitoring overnight.  They requested the hospitalist team consult them in the morning if he is still having concerning symptoms.  Hospitalist team called for admission.   Final Clinical Impressions(s) / ED Diagnoses   Final diagnoses:  Precordial pain     Clinical Impression: 1. Precordial pain     Disposition: Admit  This note was prepared with assistance of Dragon voice recognition software. Occasional wrong-word or sound-a-like substitutions may have occurred due to the inherent limitations of voice recognition software.      Kimbria Camposano, Gwenyth Allegra, MD 02/10/19 0020

## 2019-02-10 ENCOUNTER — Observation Stay (HOSPITAL_BASED_OUTPATIENT_CLINIC_OR_DEPARTMENT_OTHER): Payer: BC Managed Care – PPO

## 2019-02-10 ENCOUNTER — Telehealth: Payer: Self-pay | Admitting: Cardiology

## 2019-02-10 DIAGNOSIS — E781 Pure hyperglyceridemia: Secondary | ICD-10-CM | POA: Diagnosis not present

## 2019-02-10 DIAGNOSIS — I10 Essential (primary) hypertension: Secondary | ICD-10-CM | POA: Diagnosis not present

## 2019-02-10 DIAGNOSIS — R079 Chest pain, unspecified: Secondary | ICD-10-CM

## 2019-02-10 DIAGNOSIS — R7303 Prediabetes: Secondary | ICD-10-CM

## 2019-02-10 DIAGNOSIS — K219 Gastro-esophageal reflux disease without esophagitis: Secondary | ICD-10-CM

## 2019-02-10 DIAGNOSIS — L409 Psoriasis, unspecified: Secondary | ICD-10-CM

## 2019-02-10 LAB — HIV ANTIBODY (ROUTINE TESTING W REFLEX): HIV Screen 4th Generation wRfx: NONREACTIVE

## 2019-02-10 LAB — COMPREHENSIVE METABOLIC PANEL
ALT: 21 U/L (ref 0–44)
AST: 22 U/L (ref 15–41)
Albumin: 3.7 g/dL (ref 3.5–5.0)
Alkaline Phosphatase: 67 U/L (ref 38–126)
Anion gap: 10 (ref 5–15)
BUN: 17 mg/dL (ref 6–20)
CO2: 23 mmol/L (ref 22–32)
Calcium: 8.3 mg/dL — ABNORMAL LOW (ref 8.9–10.3)
Chloride: 106 mmol/L (ref 98–111)
Creatinine, Ser: 1.16 mg/dL (ref 0.61–1.24)
GFR calc Af Amer: >60 mL/min (ref >60)
GFR calc non Af Amer: >60 mL/min (ref >60)
Glucose, Bld: 136 mg/dL — ABNORMAL HIGH (ref 70–99)
Potassium: 3.9 mmol/L (ref 3.5–5.1)
Sodium: 139 mmol/L (ref 135–145)
Total Bilirubin: 0.3 mg/dL (ref 0.3–1.2)
Total Protein: 6.5 g/dL (ref 6.5–8.1)

## 2019-02-10 LAB — CBC
HCT: 49.6 % (ref 39.0–52.0)
Hemoglobin: 16.1 g/dL (ref 13.0–17.0)
MCH: 29.6 pg (ref 26.0–34.0)
MCHC: 32.5 g/dL (ref 30.0–36.0)
MCV: 91.2 fL (ref 80.0–100.0)
Platelets: 182 10*3/uL (ref 150–400)
RBC: 5.44 MIL/uL (ref 4.22–5.81)
RDW: 14 % (ref 11.5–15.5)
WBC: 7.5 10*3/uL (ref 4.0–10.5)
nRBC: 0 % (ref 0.0–0.2)

## 2019-02-10 LAB — LIPID PANEL
Cholesterol: 194 mg/dL (ref 0–200)
HDL: 34 mg/dL — ABNORMAL LOW (ref >40)
LDL Cholesterol: 84 mg/dL (ref 0–99)
Total CHOL/HDL Ratio: 5.7 RATIO
Triglycerides: 380 mg/dL — ABNORMAL HIGH (ref <150)
VLDL: 76 mg/dL — ABNORMAL HIGH (ref 0–40)

## 2019-02-10 LAB — ECHOCARDIOGRAM COMPLETE
Height: 67 in
Weight: 3340.41 oz

## 2019-02-10 LAB — HEMOGLOBIN A1C
Hgb A1c MFr Bld: 6.1 % — ABNORMAL HIGH (ref 4.8–5.6)
Mean Plasma Glucose: 128.37 mg/dL

## 2019-02-10 LAB — TROPONIN I (HIGH SENSITIVITY): Troponin I (High Sensitivity): 15 ng/L (ref <18)

## 2019-02-10 NOTE — Telephone Encounter (Signed)
New Message   Per Margreta Journey Dr. Marylyn Ishihara wants to know if there are any procedures that the patient not to eat. Please advise.

## 2019-02-10 NOTE — Progress Notes (Signed)
Marland [facility]  PROGRESS NOTE    Jacob Cannon  LTJ:[id number] DOB: 1971/04/20 DOA: 02/09/2019 PCP: Bernerd Limbo, MD   Brief Narrative:    Jacob Cannon  is a 48 y.o. male, w hypertension, hyperlipidemia, fatty liver, psoriasis, OSA on cpap, normal cardiac catheterization 07/29/2018, apparently noted some chest discomfort while in [facility].  Pt states that when he got home yesterday and was moving luggage back into the house he had a brief episode of substernal chest pain "tightness" without radiation.  Pt took slg nitro with relief after about 20 minutes.  Pt this morning again had chest tightness at about 9 am.  Pt states lasted for about 1 hour 30 minutes til took slg nitro and had relief after about 15 minutes,  Slightly diaphoretic,  Pt was concerned about the severity of the discomfort and therefore presented to ED for evaluation.  Pt points to area just left of sternum and states hurts with palpation.  "it feels like something is just moving in his chest"  Pt denies fever, chills, palp, sob, n/v, heartburn, abd pain, diarrhea, brbpr, dysuria.    Assessment & Plan:   Active Problems:   Essential hypertension   Psoriasis   Gastroesophageal reflux disease without esophagitis   Chest pain   Hypertriglyceridemia   Prediabetes  Chest pain      - trp neg     - echo results pending     - ASA, lipitor     - started on coreg/imdur?     - Cardiology onboard; awaiting final recs  Hypertriglyceridemia     - Tchol 194, LDL 84, trigly 380     - lipitor  Pre-diabetes     - A1c 6.1     - metformin, ASA  HTN     - resumed home lisinopril  GERD     - protonix  Psoriasis     - Recommended that he see rheumatologist/ dermatology for biologic   DVT prophylaxis: lovenox Code Status: FULL   Disposition Plan: TBD   Consultants:   Cardiology   Subjective: "These sores have been going on a while."  Objective: Vitals:   02/10/19 0041 02/10/19 0043 02/10/19 0500 02/10/19 0523   BP:  131/76  124/76  Pulse: 86 80  70  Resp: 18   18  Temp:    97.7 F (36.5 C)  TempSrc:    Oral  SpO2: 95% 98%  100%  Weight:   94.7 kg   Height:        Intake/Output Summary (Last 24 hours) at 02/10/2019 1319 Last data filed at 02/10/2019 0900 Gross per 24 hour  Intake 290.3 ml  Output 200 ml  Net 90.3 ml   Filed Weights   02/09/19 2321 02/10/19 0500  Weight: 93.8 kg 94.7 kg    Examination:  General: 48 y.o. male resting in bed in NAD Cardiovascular: RRR, +S1, S2, no m/g/r, equal pulses throughout Respiratory: CTABL, no w/r/r, normal WOB GI: BS+, NDNT, no masses noted, no organomegaly noted MSK: No e/c/c Skin: psoriatic plaques noted Neuro: A&O x 3, no focal deficits Psyc: Appropriate interaction and affect, calm/cooperative     Data Reviewed: I have personally reviewed following labs and imaging studies.  CBC: Recent Labs  Lab 02/09/19 1234 02/10/19 0452  WBC 7.4 7.5  HGB 16.3 16.1  HCT 50.6 49.6  MCV 89.7 91.2  PLT 273 076   Basic Metabolic Panel: Recent Labs  Lab 02/09/19 1234 02/10/19 0452  NA 139  139  K 4.3 3.9  CL 108 106  CO2 21* 23  GLUCOSE 142* 136*  BUN 16 17  CREATININE 1.25* 1.16  CALCIUM 9.1 8.3*   GFR: Estimated Creatinine Clearance: 85.4 mL/min (by C-G formula based on SCr of 1.16 mg/dL). Liver Function Tests: Recent Labs  Lab 02/09/19 1234 02/10/19 0452  AST 28 22  ALT 20 21  ALKPHOS 62 67  BILITOT 0.3 0.3  PROT 7.3 6.5  ALBUMIN 4.3 3.7   Recent Labs  Lab 02/09/19 1234  LIPASE 29   No results for input(s): AMMONIA in the last 168 hours. Coagulation Profile: No results for input(s): INR, PROTIME in the last 168 hours. Cardiac Enzymes: No results for input(s): CKTOTAL, CKMB, CKMBINDEX, TROPONINI in the last 168 hours. BNP (last 3 results) No results for input(s): PROBNP in the last 8760 hours. HbA1C: Recent Labs    02/10/19 0452  HGBA1C 6.1*   CBG: No results for input(s): GLUCAP in the last 168  hours. Lipid Profile: Recent Labs    02/10/19 0452  CHOL 194  HDL 34*  LDLCALC 84  TRIG 380*  CHOLHDL 5.7   Thyroid Function Tests: No results for input(s): TSH, T4TOTAL, FREET4, T3FREE, THYROIDAB in the last 72 hours. Anemia Panel: No results for input(s): VITAMINB12, FOLATE, FERRITIN, TIBC, IRON, RETICCTPCT in the last 72 hours. Sepsis Labs: No results for input(s): PROCALCITON, LATICACIDVEN in the last 168 hours.  Recent Results (from the past 240 hour(s))  SARS Coronavirus 2 (CEPHEID - Performed in [place] hospital lab), Hosp Order     Status: None   Collection Time: 02/09/19  3:39 PM   Specimen: Nasopharyngeal Swab  Result Value Ref Range Status   SARS Coronavirus 2 NEGATIVE NEGATIVE Final    Comment: (NOTE) If result is NEGATIVE SARS-CoV-2 target nucleic acids are NOT DETECTED. The SARS-CoV-2 RNA is generally detectable in upper and lower  respiratory specimens during the acute phase of infection. The lowest  concentration of SARS-CoV-2 viral copies this assay can detect is 250  copies / mL. A negative result does not preclude SARS-CoV-2 infection  and should not be used as the sole basis for treatment or other  patient management decisions.  A negative result may occur with  improper specimen collection / handling, submission of specimen other  than nasopharyngeal swab, presence of viral mutation(s) within the  areas targeted by this assay, and inadequate number of viral copies  (<250 copies / mL). A negative result must be combined with clinical  observations, patient history, and epidemiological information. If result is POSITIVE SARS-CoV-2 target nucleic acids are DETECTED. The SARS-CoV-2 RNA is generally detectable in upper and lower  respiratory specimens dur ing the acute phase of infection.  Positive  results are indicative of active infection with SARS-CoV-2.  Clinical  correlation with patient history and other diagnostic information is  necessary to  determine patient infection status.  Positive results do  not rule out bacterial infection or co-infection with other viruses. If result is PRESUMPTIVE POSTIVE SARS-CoV-2 nucleic acids MAY BE PRESENT.   A presumptive positive result was obtained on the submitted specimen  and confirmed on repeat testing.  While 2019 novel coronavirus  (SARS-CoV-2) nucleic acids may be present in the submitted sample  additional confirmatory testing may be necessary for epidemiological  and / or clinical management purposes  to differentiate between  SARS-CoV-2 and other Sarbecovirus currently known to infect humans.  If clinically indicated additional testing with an alternate test  methodology (  [id number]) is advised. The SARS-CoV-2 RNA is generally  detectable in upper and lower respiratory sp ecimens during the acute  phase of infection. The expected result is Negative. Fact Sheet for Patients:  StrictlyIdeas.no Fact Sheet for Healthcare Providers: BankingDealers.co.za This test is not yet approved or cleared by the [place] FDA and has been authorized for detection and/or diagnosis of SARS-CoV-2 by FDA under an Emergency Use Authorization (EUA).  This EUA will remain in effect (meaning this test can be used) for the duration of the COVID-19 declaration under Section 564(b)(1) of the Act, 21 U.S.C. section 360bbb-3(b)(1), unless the authorization is terminated or revoked sooner. Performed at [facility] [address] [place],  [address]          Radiology Studies: Dg Chest 2 View  Result Date: 02/09/2019 CLINICAL DATA:  Chest pain EXAM: CHEST - 2 VIEW COMPARISON:  July 22, 2018 FINDINGS: Lungs are clear. Heart size and pulmonary vascularity are normal. No adenopathy. No pneumothorax. No bone lesions. IMPRESSION: No edema or consolidation. Electronically Signed   By: Lowella Grip III M.D.   On: 02/09/2019  13:04   Vas [place] Lower Extremity Venous (dvt) (only [facility])  Result Date: 02/09/2019  Lower Venous Study Indications: Swelling, Chest pain, and SOB.  Comparison Study: 03/27/2014 Left lower extremity negative Performing Technologist: Toma Copier RVS  Examination Guidelines: A complete evaluation includes B-mode imaging, spectral Doppler, color Doppler, and power Doppler as needed of all accessible portions of each vessel. Bilateral testing is considered an integral part of a complete examination. Limited examinations for reoccurring indications may be performed as noted.  +-----+---------------+---------+-----------+----------+-------+  RIGHT Compressibility Phasicity Spontaneity Properties Summary  +-----+---------------+---------+-----------+----------+-------+  CFV   Full            Yes       Yes                             +-----+---------------+---------+-----------+----------+-------+  SFJ   Full                                                      +-----+---------------+---------+-----------+----------+-------+   +---------+---------------+---------+-----------+----------+------------------+  LEFT      Compressibility Phasicity Spontaneity Properties Summary             +---------+---------------+---------+-----------+----------+------------------+  CFV       Full            Yes       Yes                                        +---------+---------------+---------+-----------+----------+------------------+  SFJ       Full                                                                 +---------+---------------+---------+-----------+----------+------------------+  FV Prox   Full            Yes       Yes                                        +---------+---------------+---------+-----------+----------+------------------+  FV Mid    Full                                                                 +---------+---------------+---------+-----------+----------+------------------+  FV Distal Full             Yes       Yes                                        +---------+---------------+---------+-----------+----------+------------------+  PFV       Full            Yes       Yes                                        +---------+---------------+---------+-----------+----------+------------------+  POP       Full            Yes       Yes                                        +---------+---------------+---------+-----------+----------+------------------+  PTV       Full                                                                 +---------+---------------+---------+-----------+----------+------------------+  PERO                                                       Difficult to image  +---------+---------------+---------+-----------+----------+------------------+     Summary: Right: There is no evidence of common femoral vein obstruction. Left: Findings appear essentially unchanged compared to previous examination. There is no evidence of deep vein thrombosis in the lower extremity. No cystic structure found in the popliteal fossa.  *See table(s) above for measurements and observations. Electronically signed by Monica Martinez MD on 02/09/2019 at 5:28:47 PM.    Final         Scheduled Meds:  aspirin EC  325 mg Oral Daily   atorvastatin  80 mg Oral q1800   carvedilol  3.125 mg Oral BID WC   enoxaparin (LOVENOX) injection  40 mg Subcutaneous QHS   isosorbide mononitrate  30 mg Oral Daily   lisinopril  20 mg Oral Daily   pantoprazole  40 mg Oral Daily   senna  1 tablet Oral Daily   sodium chloride flush  3 mL Intravenous Q12H   triamcinolone cream  1 application Topical Daily   Continuous Infusions:  sodium chloride       LOS: 0 days    Time spent: 35 minutes spent in the coordination of care today.  Jonnie Finner, DO [facility] Pager [phone]  If 7PM-7AM, please contact night-coverage [email or web address] Password [email or web address] 02/10/2019, 1:19 PM

## 2019-02-10 NOTE — Progress Notes (Signed)
  Echocardiogram 2D Echocardiogram has been performed.  Jacob Cannon 02/10/2019, 1:54 PM

## 2019-02-10 NOTE — Progress Notes (Signed)
MRI called saying patient needs to be transported from [facility] to [facility] around 11:30 on Wednesday, 02/11/19 for appointment at 1300.

## 2019-02-10 NOTE — Progress Notes (Signed)
RN contacted MRI at [facility] to check if patient is on their list for tomorrow since [facility] does not perform needed procedure.  RN was told the department would call back before 1900 today.

## 2019-02-10 NOTE — Consult Note (Signed)
Cardiology Consultation:   Patient ID: Jacob Cannon MRN: [id number]; DOB: 1971/02/14  Admit date: 02/09/2019 Date of Consult: 02/10/2019  Primary Care Provider: Bernerd Limbo, MD Primary Cardiologist: Dr. Geraldo Pitter Primary Electrophysiologist:  None    Patient Profile:   Jacob Cannon is a 48 y.o. male with a hx of "enlarged heart", "murmur since birth" HTN, HLD, GERD, OSA, obesity, fatty liver, psoriasis, family h/o heart disease and normal cardiac cath 06/2018 who is being seen today for the evaluation of chest pain at the request of Dr. Marylyn Ishihara, Internal Medicine.   History of Present Illness:   Jacob Cannon reports that he has had a murmur since birth and was told that he had an enlarged heart. He states that he was previously followed by [facility] but we do not have those records on file. He notes his brother had heart issues and also had a murmur and died suddenly from heart disease at age 65. His father also had heart disease (he believes heart failure) in his late 12s. Pt denies any h/o tobacco use. No DM. He reports full compliance w/ HTN meds. He has a CPAP at home but admits that he is not fully compliant.   He most recently was evaluated for CP in Dec of last year and seen in clinic by Dr. Geraldo Pitter. He had CP that was concerning for unstable angina. Dr. Basilio Cairo referred him for diagnostic cath on 07/29/2018 which showed normal coronary arteries. Cath also revealed hyperdynamic left ventricle with EF greater than 70%.  Elevated LVEDP raised question of left ventricular hypertrophy/hypertrophic myocardial process.  Hemodynamics were consistent with a degree of diastolic dysfunction/chronic diastolic heart failure. Dr. Tamala Julian recommended a f/u 2D echo but I do not see that this was ever done. Microvascular disease was also in the differential for the etiology of his CP. Based on chart, it appears that the patient never followed up in clinic post cath.   Pt reports that he  recently developed recurrent exertional CP. He was at [facility] this past weekend. On Saturday, he was walking on the beach and developed severe exertional CP, dyspnea and perfuse diaphoresis. Also had some palpitations but no syncope/ near syncope. When he made it back to his condo, he rested and took 1 SL NTG and pain resolved. On Sunday, he returned home and was transporting his luggage when he had a similar episode that also improved w/ rest. On Monday, while at work, he was rushing to make it to a meeting and was walking fast and developed the same symptoms, exertional left sided chest tightness, with radiation in the left arm, dyspnea and diaphoresis. Symptoms improved with rest. Given his symptoms, he came to the ED for evaluation.    ED Course: BP in the ED was normal at 122/79. EKG showed sinus tach 100 bpm, with ?RVH and repolarization abnormalities, minimal ST elevations in AVr and V1, c/w prior EKG. Hs Trop trend: 14, 18, 17, 15. Additional labs, BMP with mild renal insuffiencey initially w/ SCr at 1.25. This has improved to 1.16 today. CBC normal.  K 3.9. BNP normal at 24.3. COVID negative. D-dimer and Lipase both negative. CXR unremarkable.   He is CP free currently.    Heart Pathway Score:  HEAR Score: 5  Past Medical History:  Diagnosis Date   Arthritis    type?   Cardiomegaly 2010   [facility]   Chest pain Chronic   GERD (gastroesophageal reflux disease)    Heart murmur congenital  Hepatic steatosis    Hyperlipidemia    Hypertension    OSA on CPAP 2010   Psoriasis    used to see derm    Sleep apnea     Past Surgical History:  Procedure Laterality Date   CARDIAC CATHETERIZATION     ESOPHAGOGASTRODUODENOSCOPY  09/13/2011   Procedure: ESOPHAGOGASTRODUODENOSCOPY (EGD);  Surgeon: Estanislado Emms., MD,FACG;  Location: [facility];  Service: Endoscopy;  Laterality: N/A;   INGUINAL HERNIA REPAIR Right    LEFT HEART CATH AND CORONARY ANGIOGRAPHY N/A  07/29/2018   Procedure: LEFT HEART CATH AND CORONARY ANGIOGRAPHY;  Surgeon: Belva Crome, MD;  Location: [facility];  Service: Cardiovascular;  Laterality: N/A;     Home Medications:  Prior to Admission medications   Medication Sig Start Date End Date Taking? Authorizing Provider  albuterol (VENTOLIN HFA) 108 (90 Base) MCG/ACT inhaler Inhale 2 puffs into the lungs every 6 (six) hours as needed for wheezing or shortness of breath.  11/18/18  Yes [provider]  chlorpheniramine-HYDROcodone (TUSSIONEX) 10-8 MG/5ML SUER Take 5 mLs by mouth every 12 (twelve) hours as needed for cough.  11/27/18  Yes [provider]  guaiFENesin (MUCINEX PO) Take 1 tablet by mouth daily.   Yes [provider]  lisinopril (PRINIVIL,ZESTRIL) 40 MG tablet Take 1 tablet (40 mg total) by mouth daily. NEED OV. 07/22/17  Yes Crenshaw, Denice Bors, MD  nitroGLYCERIN (NITROSTAT) 0.3 MG SL tablet Place 1 tablet under the tongue every 5 (five) minutes as needed for chest pain.  07/21/18  Yes [provider]  pantoprazole (PROTONIX) 40 MG tablet Take 1 tablet (40 mg total) by mouth daily with breakfast. Patient taking differently: Take 40 mg by mouth daily.  04/06/16  Yes Esterwood, Amy S, PA-C  senna (SENOKOT) 8.6 MG tablet Take 1 tablet by mouth daily.   Yes [provider]  triamcinolone cream (KENALOG) 0.5 % APPLY TO AFFECTED AREA TWICE A DAY Patient taking differently: Apply 1 application topically daily.  10/22/16  Yes Jeffery, Domingo Mend, PA  HYDROcodone-acetaminophen (NORCO/VICODIN) 5-325 MG tablet Take 1 tablet by mouth every 4 (four) hours as needed. Patient taking differently: Take 1 tablet by mouth every 6 (six) hours as needed for moderate pain.  01/19/17   Evalee Jefferson, PA-C  polyethylene glycol powder (GLYCOLAX/MIRALAX) powder Take 17 g by mouth daily as needed for moderate constipation.     [provider]    Inpatient Medications: Scheduled Meds:  aspirin EC   325 mg Oral Daily   atorvastatin  80 mg Oral q1800   carvedilol  3.125 mg Oral BID WC   enoxaparin (LOVENOX) injection  40 mg Subcutaneous QHS   isosorbide mononitrate  30 mg Oral Daily   lisinopril  20 mg Oral Daily   pantoprazole  40 mg Oral Daily   senna  1 tablet Oral Daily   sodium chloride flush  3 mL Intravenous Q12H   triamcinolone cream  1 application Topical Daily   Continuous Infusions:  sodium chloride     PRN Meds: sodium chloride, acetaminophen **OR** acetaminophen, albuterol, HYDROcodone-acetaminophen, polyethylene glycol, sodium chloride flush  Allergies:    Allergies  Allergen Reactions   Methocarbamol     Chest pain    Social History:   Social History   Socioeconomic History   Marital status: Married    Spouse name: Not on file   Number of children: 3   Years of education: Not on file   Highest education level:  Not on file  Occupational History    Employer: [employer]  Social Needs   Financial resource strain: Not on file   Food insecurity    Worry: Not on file    Inability: Not on file   Transportation needs    Medical: Not on file    Non-medical: Not on file  Tobacco Use   Smoking status: Former Smoker    Packs/day: 2.00    Types: Cigars    Quit date: 04/19/2001    Years since quitting: 17.8   Smokeless tobacco: Never Used  Substance and Sexual Activity   Alcohol use: Yes    Comment: wine- occasionally   Drug use: No   Sexual activity: Never  Lifestyle   Physical activity    Days per week: Not on file    Minutes per session: Not on file   Stress: Not on file  Relationships   Social connections    Talks on phone: Not on file    Gets together: Not on file    Attends religious service: Not on file    Active member of club or organization: Not on file    Attends meetings of clubs or organizations: Not on file    Relationship status: Not on file   Intimate partner violence    Fear of current or ex  partner: Not on file    Emotionally abused: Not on file    Physically abused: Not on file    Forced sexual activity: Not on file  Other Topics Concern   Not on file  Social History Narrative   3 biological kids and 5 adopted---    Family History:    Family History  Problem Relation Age of Onset   Multiple sclerosis Mother    Diabetes Mother    Heart attack Father    Diabetes Brother    Heart disease Maternal Grandmother    Diabetes Maternal Uncle    Colon cancer Neg Hx    Prostate cancer Neg Hx      ROS:  Please see the history of present illness.   All other ROS reviewed and negative.     Physical Exam/Data:   Vitals:   02/10/19 0041 02/10/19 0043 02/10/19 0500 02/10/19 0523  BP:  131/76  124/76  Pulse: 86 80  70  Resp: 18   18  Temp:    97.7 F (36.5 C)  TempSrc:    Oral  SpO2: 95% 98%  100%  Weight:   94.7 kg   Height:        Intake/Output Summary (Last 24 hours) at 02/10/2019 0740 Last data filed at 02/10/2019 0523 Gross per 24 hour  Intake 290.3 ml  Output 200 ml  Net 90.3 ml   Last 3 Weights 02/10/2019 02/09/2019 07/29/2018  Weight (lbs) 208 lb 12.4 oz 206 lb 12.7 oz 206 lb  Weight (kg) 94.7 kg 93.8 kg 93.441 kg     Body mass index is 32.7 kg/m.  General:  Obese AAM, Well nourished, well developed, in no acute distress HEENT: normal Lymph: no adenopathy Neck: no JVD Endocrine:  No thryomegaly Vascular: No carotid bruits; FA pulses 2+ bilaterally without bruits  Cardiac:  normal S1, S2; RRR; no murmur  Lungs:  clear to auscultation bilaterally, no wheezing, rhonchi or rales  Abd: soft, nontender, no hepatomegaly  Ext: no edema Musculoskeletal:  No deformities, BUE and BLE strength normal and equal Skin: warm and dry, psoriasis  Neuro:  CNs 2-12 intact, no  focal abnormalities noted Psych:  Normal affect   EKG:  The EKG was personally reviewed and demonstrates:  sinus tach 100 bpm, with ?RVH and repolarization abnormalities, minimal ST  elevations in AVr and V1, c/w prior EKG  Telemetry:  Telemetry was personally reviewed and demonstrates:  NSR 70s   Relevant CV Studies:  [facility] 07/29/2018 LEFT HEART CATH AND CORONARY ANGIOGRAPHY  Conclusion   Left dominant coronary anatomy.  Normal short left main.  Normal large LAD which wraps around the left ventricular apex.  Large dominant circumflex which gives the PDA.  The entire circumflex system is normal.  Small nondominant right coronary.  Hyperdynamic left ventricle with EF greater than 70%.  Elevated LVEDP raises question of left ventricular hypertrophy/hypertrophic myocardial process.  Hemodynamics are consistent with a degree of diastolic dysfunction/chronic diastolic heart failure.  RECOMMENDATIONS:   2D Doppler echocardiogram to assess for evidence of ventricular hypertrophy.  Clinical diagnosis may be INOCA (ischemia with no obstructive coronary artery disease).  This could be on the basis of supply demand mismatch from microvascular disease.  Aggressive risk factor management including blood pressure less than 130/80, lipid-lowering, management of sleep apnea, and weight loss.   Left Heart  Left Ventricle The left ventricular size is normal. There is hyperdynamic left ventricular systolic function. LV end diastolic pressure is severely elevated. The left ventricular ejection fraction is greater than 65% by visual estimate. No regional wall motion abnormalities.     Laboratory Data:  High Sensitivity Troponin:   Recent Labs  Lab 02/09/19 1234 02/09/19 1758 02/09/19 2132 02/09/19 2324  TROPONINIHS 14 18.0* 17.0 15.0     Cardiac EnzymesNo results for input(s): TROPONINI in the last 168 hours. No results for input(s): TROPIPOC in the last 168 hours.  Chemistry Recent Labs  Lab 02/09/19 1234 02/10/19 0452  NA 139 139  K 4.3 3.9  CL 108 106  CO2 21* 23  GLUCOSE 142* 136*  BUN 16 17  CREATININE 1.25* 1.16  CALCIUM 9.1 8.3*  GFRNONAA >60 >60    GFRAA >60 >60  ANIONGAP 10 10    Recent Labs  Lab 02/09/19 1234 02/10/19 0452  PROT 7.3 6.5  ALBUMIN 4.3 3.7  AST 28 22  ALT 20 21  ALKPHOS 62 67  BILITOT 0.3 0.3   Hematology Recent Labs  Lab 02/09/19 1234 02/10/19 0452  WBC 7.4 7.5  RBC 5.64 5.44  HGB 16.3 16.1  HCT 50.6 49.6  MCV 89.7 91.2  MCH 28.9 29.6  MCHC 32.2 32.5  RDW 13.6 14.0  PLT 273 182   BNP Recent Labs  Lab 02/09/19 1234  BNP 24.3    DDimer  Recent Labs  Lab 02/09/19 1234  DDIMER <0.27     Radiology/Studies:  Dg Chest 2 View  Result Date: 02/09/2019 CLINICAL DATA:  Chest pain EXAM: CHEST - 2 VIEW COMPARISON:  July 22, 2018 FINDINGS: Lungs are clear. Heart size and pulmonary vascularity are normal. No adenopathy. No pneumothorax. No bone lesions. IMPRESSION: No edema or consolidation. Electronically Signed   By: Lowella Grip III M.D.   On: 02/09/2019 13:04   Vas [place] Lower Extremity Venous (dvt) (only [facility])  Result Date: 02/09/2019  Lower Venous Study Indications: Swelling, Chest pain, and SOB.  Comparison Study: 03/27/2014 Left lower extremity negative Performing Technologist: Toma Copier RVS  Examination Guidelines: A complete evaluation includes B-mode imaging, spectral Doppler, color Doppler, and power Doppler as needed of all accessible portions of each vessel. Bilateral testing is considered  an integral part of a complete examination. Limited examinations for reoccurring indications may be performed as noted.  +-----+---------------+---------+-----------+----------+-------+  RIGHT Compressibility Phasicity Spontaneity Properties Summary  +-----+---------------+---------+-----------+----------+-------+  CFV   Full            Yes       Yes                             +-----+---------------+---------+-----------+----------+-------+  SFJ   Full                                                      +-----+---------------+---------+-----------+----------+-------+    +---------+---------------+---------+-----------+----------+------------------+  LEFT      Compressibility Phasicity Spontaneity Properties Summary             +---------+---------------+---------+-----------+----------+------------------+  CFV       Full            Yes       Yes                                        +---------+---------------+---------+-----------+----------+------------------+  SFJ       Full                                                                 +---------+---------------+---------+-----------+----------+------------------+  FV Prox   Full            Yes       Yes                                        +---------+---------------+---------+-----------+----------+------------------+  FV Mid    Full                                                                 +---------+---------------+---------+-----------+----------+------------------+  FV Distal Full            Yes       Yes                                        +---------+---------------+---------+-----------+----------+------------------+  PFV       Full            Yes       Yes                                        +---------+---------------+---------+-----------+----------+------------------+  POP       Full            Yes  Yes                                        +---------+---------------+---------+-----------+----------+------------------+  PTV       Full                                                                 +---------+---------------+---------+-----------+----------+------------------+  PERO                                                       Difficult to image  +---------+---------------+---------+-----------+----------+------------------+     Summary: Right: There is no evidence of common femoral vein obstruction. Left: Findings appear essentially unchanged compared to previous examination. There is no evidence of deep vein thrombosis in the lower extremity. No cystic structure found in the popliteal  fossa.  *See table(s) above for measurements and observations. Electronically signed by Monica Martinez MD on 02/09/2019 at 5:28:47 PM.    Final     Assessment and Plan:   Jacob Cannon is a 48 y.o. male with a hx of "enlarged heart", "murmur since birth" HTN, HLD, GERD, OSA, obesity, fatty liver, psoriasis, family h/o heart disease and normal cardiac cath 06/2018 who is being seen today for the evaluation of chest pain at the request of Dr. Marylyn Ishihara, Internal Medicine.   1. Exertional Chest Pain and Dyspnea: he has ruled out for PE w/ negative D-dimer. BNP also negative and CXR negative. Pt w/ recent cardiac cath in Dec 2019 showing normal coronary arteries, however there was concern for possible left ventricular hypertrophy/hypertrophic myocardial process. Echo was recommended but pt failed to f/u. We will obtain echo today to rule this out. Also ? Microvascular angina. He may need further titration of meds.  May need to add Imdur or Ranexa. May also consider cardiac MRI. ? Outpatient cardiac monitor to r/o tachyarrhythmia.   For questions or updates, please contact [facility] Please consult [email or web address] for contact info under     Signed, Lyda Jester, PA-C  02/10/2019 7:40 AM

## 2019-02-10 NOTE — Plan of Care (Signed)
  Problem: Pain Managment: Goal: General experience of comfort will improve Outcome: Progressing  No C/O chest pain for now.

## 2019-02-10 NOTE — Telephone Encounter (Addendum)
Could you please clarify or restate the question? I don't understand Tx,

## 2019-02-10 NOTE — Progress Notes (Signed)
Patient C/O left sided pressure chest pain, rated 8 out of 10 pain scale, Vitals 131/76, 80, 98%-RA,  PRN Vicodin given and Dr. Maudie Mercury notified, will continue to assess patient.

## 2019-02-10 NOTE — Progress Notes (Signed)
Patient denies any pain/chest pain. Will continue to assess patient.

## 2019-02-11 ENCOUNTER — Ambulatory Visit (HOSPITAL_COMMUNITY)
Admission: EM | Admit: 2019-02-11 | Discharge: 2019-02-11 | Disposition: A | Discharge status: Home / Self Care | Payer: BC Managed Care – PPO | Source: Home / Self Care | Attending: Cardiology | Admitting: Cardiology

## 2019-02-11 ENCOUNTER — Observation Stay (HOSPITAL_COMMUNITY)
Admission: EM | Admit: 2019-02-11 | Payer: BC Managed Care – PPO | Source: Home / Self Care | Admitting: Internal Medicine

## 2019-02-11 DIAGNOSIS — G4733 Obstructive sleep apnea (adult) (pediatric): Secondary | ICD-10-CM | POA: Diagnosis present

## 2019-02-11 DIAGNOSIS — Z6833 Body mass index (BMI) 33.0-33.9, adult: Secondary | ICD-10-CM | POA: Diagnosis not present

## 2019-02-11 DIAGNOSIS — M1990 Unspecified osteoarthritis, unspecified site: Secondary | ICD-10-CM | POA: Diagnosis present

## 2019-02-11 DIAGNOSIS — E669 Obesity, unspecified: Secondary | ICD-10-CM | POA: Diagnosis present

## 2019-02-11 DIAGNOSIS — I422 Other hypertrophic cardiomyopathy: Secondary | ICD-10-CM

## 2019-02-11 DIAGNOSIS — Z8249 Family history of ischemic heart disease and other diseases of the circulatory system: Secondary | ICD-10-CM | POA: Diagnosis not present

## 2019-02-11 DIAGNOSIS — I10 Essential (primary) hypertension: Secondary | ICD-10-CM | POA: Diagnosis present

## 2019-02-11 DIAGNOSIS — K219 Gastro-esophageal reflux disease without esophagitis: Secondary | ICD-10-CM | POA: Diagnosis present

## 2019-02-11 DIAGNOSIS — R9431 Abnormal electrocardiogram [ECG] [EKG]: Secondary | ICD-10-CM | POA: Diagnosis not present

## 2019-02-11 DIAGNOSIS — Z79899 Other long term (current) drug therapy: Secondary | ICD-10-CM | POA: Diagnosis not present

## 2019-02-11 DIAGNOSIS — Z1159 Encounter for screening for other viral diseases: Secondary | ICD-10-CM | POA: Diagnosis not present

## 2019-02-11 DIAGNOSIS — K760 Fatty (change of) liver, not elsewhere classified: Secondary | ICD-10-CM | POA: Diagnosis present

## 2019-02-11 DIAGNOSIS — Z79891 Long term (current) use of opiate analgesic: Secondary | ICD-10-CM | POA: Diagnosis not present

## 2019-02-11 DIAGNOSIS — E781 Pure hyperglyceridemia: Secondary | ICD-10-CM | POA: Diagnosis present

## 2019-02-11 DIAGNOSIS — Z833 Family history of diabetes mellitus: Secondary | ICD-10-CM | POA: Diagnosis not present

## 2019-02-11 DIAGNOSIS — E785 Hyperlipidemia, unspecified: Secondary | ICD-10-CM | POA: Diagnosis present

## 2019-02-11 DIAGNOSIS — R0789 Other chest pain: Secondary | ICD-10-CM | POA: Diagnosis present

## 2019-02-11 DIAGNOSIS — L409 Psoriasis, unspecified: Secondary | ICD-10-CM | POA: Diagnosis present

## 2019-02-11 DIAGNOSIS — R072 Precordial pain: Secondary | ICD-10-CM

## 2019-02-11 DIAGNOSIS — F17290 Nicotine dependence, other tobacco product, uncomplicated: Secondary | ICD-10-CM | POA: Diagnosis present

## 2019-02-11 DIAGNOSIS — R7303 Prediabetes: Secondary | ICD-10-CM | POA: Diagnosis present

## 2019-02-11 DIAGNOSIS — I421 Obstructive hypertrophic cardiomyopathy: Secondary | ICD-10-CM | POA: Diagnosis present

## 2019-02-11 DIAGNOSIS — Z8241 Family history of sudden cardiac death: Secondary | ICD-10-CM | POA: Diagnosis not present

## 2019-02-11 DIAGNOSIS — R0609 Other forms of dyspnea: Secondary | ICD-10-CM | POA: Diagnosis present

## 2019-02-11 LAB — CBC WITH DIFFERENTIAL/PLATELET
Abs Immature Granulocytes: 0.03 10*3/uL (ref 0.00–0.07)
Basophils Absolute: 0.1 10*3/uL (ref 0.0–0.1)
Basophils Relative: 1 %
Eosinophils Absolute: 0.3 10*3/uL (ref 0.0–0.5)
Eosinophils Relative: 5 %
HCT: 47.9 % (ref 39.0–52.0)
Hemoglobin: 15.6 g/dL (ref 13.0–17.0)
Immature Granulocytes: 1 %
Lymphocytes Relative: 36 %
Lymphs Abs: 2.3 10*3/uL (ref 0.7–4.0)
MCH: 29.7 pg (ref 26.0–34.0)
MCHC: 32.6 g/dL (ref 30.0–36.0)
MCV: 91.1 fL (ref 80.0–100.0)
Monocytes Absolute: 0.7 10*3/uL (ref 0.1–1.0)
Monocytes Relative: 11 %
Neutro Abs: 3 10*3/uL (ref 1.7–7.7)
Neutrophils Relative %: 46 %
Platelets: 237 10*3/uL (ref 150–400)
RBC: 5.26 MIL/uL (ref 4.22–5.81)
RDW: 13.7 % (ref 11.5–15.5)
WBC: 6.4 10*3/uL (ref 4.0–10.5)
nRBC: 0 % (ref 0.0–0.2)

## 2019-02-11 LAB — RENAL FUNCTION PANEL
Albumin: 3.6 g/dL (ref 3.5–5.0)
Anion gap: 9 (ref 5–15)
BUN: 17 mg/dL (ref 6–20)
CO2: 21 mmol/L — ABNORMAL LOW (ref 22–32)
Calcium: 8.3 mg/dL — ABNORMAL LOW (ref 8.9–10.3)
Chloride: 109 mmol/L (ref 98–111)
Creatinine, Ser: 1.09 mg/dL (ref 0.61–1.24)
GFR calc Af Amer: >60 mL/min (ref >60)
GFR calc non Af Amer: >60 mL/min (ref >60)
Glucose, Bld: 101 mg/dL — ABNORMAL HIGH (ref 70–99)
Phosphorus: 3.6 mg/dL (ref 2.5–4.6)
Potassium: 4.6 mmol/L (ref 3.5–5.1)
Sodium: 139 mmol/L (ref 135–145)

## 2019-02-11 LAB — MAGNESIUM: Magnesium: 2.2 mg/dL (ref 1.7–2.4)

## 2019-02-11 MED ORDER — GADOBUTROL 1 MMOL/ML IV SOLN
10.0000 mL | Freq: Once | INTRAVENOUS | Status: AC | PRN
  Administered 2019-02-11: 10 mL via INTRAVENOUS

## 2019-02-11 NOTE — Progress Notes (Addendum)
[facility]                                                                              Patient Demographics  Jacob Cannon, is a 48 y.o. male, DOB - 1971-04-25, XBD:[id number]  Admit date - 02/09/2019   Admitting Physician Jani Gravel, MD  Outpatient Primary MD for the patient is Bernerd Limbo, MD  Outpatient specialists:   LOS - 0  days   Medical records reviewed and are as summarized below:    Chief Complaint  Patient presents with  . Chest Pain  . Shortness of Breath       Brief summary   Patient is a 48 year old male with hypertension, hyperlipidemia, fatty liver, psoriasis, OSA on cpap, normal cardiac catheterization 07/29/2018, apparently noted some chest discomfort while in [facility]. Pt states that when he got home yesterday and was moving luggage back into the house he had a brief episode of substernal chest pain "tightness" without radiation. Pt took slg nitro with relief after about 20 minutes. Pt this morning again had chest tightness at about 9 am. Pt states lasted for about 1 hour 30 minutes til took slg nitro and had relief after about 15 minutes, Slightly diaphoretic, Pt was concerned about the severity of the discomfort and therefore presented to ED for evaluation. Pt points to area just left of sternum and states hurts with palpation. "it feels like something is just moving in his chest" Pt denied fever, chills, palp, sob, n/v, heartburn, abd pain, diarrhea, brbpr, dysuria  COVID-19 test negative  Assessment & Plan    Principal Problem: Atypical chest pain, DOE:  -In the setting of history of hypertension, hyperlipidemia, OSA, history of SCD in brother at 87, father had CHF.  Cardiac cath in 06/2018 had shown normal coronaries. -Troponins negative, d-dimer negative, no ongoing chest pain at rest, feels shortness of breath and intermittent chest pain on ambulation, exertion -EKG had shown LVH and diffuse ST-T wave abnormality  -2D echo showed EF > 65%, severely increased left ventricular wall thickness, consider HCM or infiltrative cardiomyopathy grade 1 DD. -Cardiology consulted, plan for cardiac MRI today to assess for hypertrophic cardiomyopathy given patient has a history of sudden cardiac death in his brother at 51yo. -Continue aspirin, beta-blocker, Imdur, lisinopril, statin  Active Problems:   Essential hypertension -BP stable, continue Imdur, Coreg, lisinopril    Gastroesophageal reflux disease without esophagitis -Continue PPI    Hypertriglyceridemia -Continue statin    Prediabetes  A1c 6.1 counseled diet and weight control Repeat hemoglobin A1c in 3 months if worsening, will need oral hypoglycemic  Code Status: Full CODE STATUS DVT Prophylaxis:  Lovenox  Family Communication: Discussed in detail with the patient, all imaging results, lab results explained to the patient and patient's wife on the phone   Disposition Plan: Plan for cardiac MRI today at [facility], will await cardiology recommendations after the procedure  Time Spent in minutes 35 minutes  Procedures:  2D echo  Consultants:   Cardiology  Antimicrobials:   Anti-infectives (From admission, onward)   None         Medications  Scheduled Meds: . aspirin  EC  325 mg Oral Daily  . atorvastatin  80 mg Oral q1800  . carvedilol  3.125 mg Oral BID WC  . enoxaparin (LOVENOX) injection  40 mg Subcutaneous QHS  . isosorbide mononitrate  30 mg Oral Daily  . lisinopril  20 mg Oral Daily  . pantoprazole  40 mg Oral Daily  . senna  1 tablet Oral Daily  . sodium chloride flush  3 mL Intravenous Q12H  . triamcinolone cream  1 application Topical Daily   Continuous Infusions: . sodium chloride     PRN Meds:.sodium chloride, acetaminophen **OR** acetaminophen, albuterol, HYDROcodone-acetaminophen, polyethylene glycol, sodium chloride flush      Subjective:   Jacob Cannon was seen and examined today.  At the time of my  encounter, no acute chest pain.  Feels somewhat short of breath with ambulation. Patient denies dizziness,  abdominal pain, N/V/D/C, new weakness, numbess, tingling. No acute events overnight.    Objective:   Vitals:   02/11/19 0425 02/11/19 0428 02/11/19 1134 02/11/19 1135  BP: 112/71  127/83   Pulse: 70  70 67  Resp: 20   16  Temp: (!) 97.5 F (36.4 C)   98.3 F (36.8 C)  TempSrc: Oral   Oral  SpO2: 100%   100%  Weight:  97.6 kg    Height:        Intake/Output Summary (Last 24 hours) at 02/11/2019 1426 Last data filed at 02/11/2019 0900 Gross per 24 hour  Intake 710 ml  Output 600 ml  Net 110 ml     Wt Readings from Last 3 Encounters:  02/11/19 97.6 kg  07/29/18 93.4 kg  07/22/18 93.4 kg     Exam  General: Alert and oriented x 3, NAD  Eyes:  HEENT:  Atraumatic, normocephalic, normal oropharynx  Cardiovascular: S1 S2 auscultate, Regular rate and rhythm.  Respiratory: Clear to auscultation bilaterally  Gastrointestinal: Obese, soft, nontender, nondistended, + bowel sounds  Ext: no pedal edema bilaterally  Neuro: No new deficit  Musculoskeletal: No digital cyanosis, clubbing  Skin: No rashes  Psych: Normal affect and demeanor, alert and oriented x3    Data Reviewed:  I have personally reviewed following labs and imaging studies  Micro Results Recent Results (from the past 240 hour(s))  SARS Coronavirus 2 (CEPHEID - Performed in [facility] hospital lab), Hosp Order     Status: None   Collection Time: 02/09/19  3:39 PM   Specimen: Nasopharyngeal Swab  Result Value Ref Range Status   SARS Coronavirus 2 NEGATIVE NEGATIVE Final    Comment: (NOTE) If result is NEGATIVE SARS-CoV-2 target nucleic acids are NOT DETECTED. The SARS-CoV-2 RNA is generally detectable in upper and lower  respiratory specimens during the acute phase of infection. The lowest  concentration of SARS-CoV-2 viral copies this assay can detect is 250  copies / mL. A negative result  does not preclude SARS-CoV-2 infection  and should not be used as the sole basis for treatment or other  patient management decisions.  A negative result may occur with  improper specimen collection / handling, submission of specimen other  than nasopharyngeal swab, presence of viral mutation(s) within the  areas targeted by this assay, and inadequate number of viral copies  (<250 copies / mL). A negative result must be combined with clinical  observations, patient history, and epidemiological information. If result is POSITIVE SARS-CoV-2 target nucleic acids are DETECTED. The SARS-CoV-2 RNA is generally detectable in upper and lower  respiratory specimens dur ing  the acute phase of infection.  Positive  results are indicative of active infection with SARS-CoV-2.  Clinical  correlation with patient history and other diagnostic information is  necessary to determine patient infection status.  Positive results do  not rule out bacterial infection or co-infection with other viruses. If result is PRESUMPTIVE POSTIVE SARS-CoV-2 nucleic acids MAY BE PRESENT.   A presumptive positive result was obtained on the submitted specimen  and confirmed on repeat testing.  While 2019 novel coronavirus  (SARS-CoV-2) nucleic acids may be present in the submitted sample  additional confirmatory testing may be necessary for epidemiological  and / or clinical management purposes  to differentiate between  SARS-CoV-2 and other Sarbecovirus currently known to infect humans.  If clinically indicated additional testing with an alternate test  methodology [phone]) is advised. The SARS-CoV-2 RNA is generally  detectable in upper and lower respiratory sp ecimens during the acute  phase of infection. The expected result is Negative. Fact Sheet for Patients:  StrictlyIdeas.no Fact Sheet for Healthcare Providers: BankingDealers.co.za This test is not yet approved or  cleared by the [place] FDA and has been authorized for detection and/or diagnosis of SARS-CoV-2 by FDA under an Emergency Use Authorization (EUA).  This EUA will remain in effect (meaning this test can be used) for the duration of the COVID-19 declaration under Section 564(b)(1) of the Act, 21 U.S.C. section 360bbb-3(b)(1), unless the authorization is terminated or revoked sooner. Performed at [facility], [place] [address] [place], [place] [address]     Radiology Reports Dg Chest 2 View  Result Date: 02/09/2019 CLINICAL DATA:  Chest pain EXAM: CHEST - 2 VIEW COMPARISON:  July 22, 2018 FINDINGS: Lungs are clear. Heart size and pulmonary vascularity are normal. No adenopathy. No pneumothorax. No bone lesions. IMPRESSION: No edema or consolidation. Electronically Signed   By: Lowella Grip III M.D.   On: 02/09/2019 13:04   Vas [place] Lower Extremity Venous (dvt) (only [facility])  Result Date: 02/09/2019  Lower Venous Study Indications: Swelling, Chest pain, and SOB.  Comparison Study: 03/27/2014 Left lower extremity negative Performing Technologist: Toma Copier RVS  Examination Guidelines: A complete evaluation includes B-mode imaging, spectral Doppler, color Doppler, and power Doppler as needed of all accessible portions of each vessel. Bilateral testing is considered an integral part of a complete examination. Limited examinations for reoccurring indications may be performed as noted.  +-----+---------------+---------+-----------+----------+-------+ RIGHTCompressibilityPhasicitySpontaneityPropertiesSummary +-----+---------------+---------+-----------+----------+-------+ CFV  Full           Yes      Yes                          +-----+---------------+---------+-----------+----------+-------+ SFJ  Full                                                 +-----+---------------+---------+-----------+----------+-------+    +---------+---------------+---------+-----------+----------+------------------+ LEFT     CompressibilityPhasicitySpontaneityPropertiesSummary            +---------+---------------+---------+-----------+----------+------------------+ CFV      Full           Yes      Yes                                     +---------+---------------+---------+-----------+----------+------------------+ SFJ  Full                                                            +---------+---------------+---------+-----------+----------+------------------+ FV Prox  Full           Yes      Yes                                     +---------+---------------+---------+-----------+----------+------------------+ FV Mid   Full                                                            +---------+---------------+---------+-----------+----------+------------------+ FV DistalFull           Yes      Yes                                     +---------+---------------+---------+-----------+----------+------------------+ PFV      Full           Yes      Yes                                     +---------+---------------+---------+-----------+----------+------------------+ POP      Full           Yes      Yes                                     +---------+---------------+---------+-----------+----------+------------------+ PTV      Full                                                            +---------+---------------+---------+-----------+----------+------------------+ PERO                                                  Difficult to image +---------+---------------+---------+-----------+----------+------------------+     Summary: Right: There is no evidence of common femoral vein obstruction. Left: Findings appear essentially unchanged compared to previous examination. There is no evidence of deep vein thrombosis in the lower extremity. No cystic structure found in the popliteal  fossa.  *See table(s) above for measurements and observations. Electronically signed by Monica Martinez MD on 02/09/2019 at 5:28:47 PM.    Final     Lab Data:  CBC: Recent Labs  Lab 02/09/19 1234 02/10/19 0452 02/11/19 0459  WBC 7.4 7.5 6.4  NEUTROABS  --   --  3.0  HGB 16.3 16.1 15.6  HCT 50.6 49.6 47.9  MCV 89.7 91.2 91.1  PLT  273 182 829   Basic Metabolic Panel: Recent Labs  Lab 02/09/19 1234 02/10/19 0452 02/11/19 0459  NA 139 139 139  K 4.3 3.9 4.6  CL 108 106 109  CO2 21* 23 21*  GLUCOSE 142* 136* 101*  BUN 16 17 17   CREATININE 1.25* 1.16 1.09  CALCIUM 9.1 8.3* 8.3*  MG  --   --  2.2  PHOS  --   --  3.6   GFR: Estimated Creatinine Clearance: 92.3 mL/min (by C-G formula based on SCr of 1.09 mg/dL). Liver Function Tests: Recent Labs  Lab 02/09/19 1234 02/10/19 0452 02/11/19 0459  AST 28 22  --   ALT 20 21  --   ALKPHOS 62 67  --   BILITOT 0.3 0.3  --   PROT 7.3 6.5  --   ALBUMIN 4.3 3.7 3.6   Recent Labs  Lab 02/09/19 1234  LIPASE 29   No results for input(s): AMMONIA in the last 168 hours. Coagulation Profile: No results for input(s): INR, PROTIME in the last 168 hours. Cardiac Enzymes: No results for input(s): CKTOTAL, CKMB, CKMBINDEX, TROPONINI in the last 168 hours. BNP (last 3 results) No results for input(s): PROBNP in the last 8760 hours. HbA1C: Recent Labs    02/10/19 0452  HGBA1C 6.1*   CBG: No results for input(s): GLUCAP in the last 168 hours. Lipid Profile: Recent Labs    02/10/19 0452  CHOL 194  HDL 34*  LDLCALC 84  TRIG 380*  CHOLHDL 5.7   Thyroid Function Tests: No results for input(s): TSH, T4TOTAL, FREET4, T3FREE, THYROIDAB in the last 72 hours. Anemia Panel: No results for input(s): VITAMINB12, FOLATE, FERRITIN, TIBC, IRON, RETICCTPCT in the last 72 hours. Urine analysis:    Component Value Date/Time   COLORURINE YELLOW 09/11/2011 1125   APPEARANCEUR CLEAR 09/11/2011 1125   LABSPEC 1.015 09/11/2011 1125    PHURINE 7.5 09/11/2011 1125   GLUCOSEU NEGATIVE 09/11/2011 1125   HGBUR NEGATIVE 09/11/2011 1125   BILIRUBINUR neg 10/30/2013 [place] 09/11/2011 1125   PROTEINUR 100 10/30/2013 1653   PROTEINUR NEGATIVE 09/11/2011 1125   UROBILINOGEN 1.0 10/30/2013 1653   UROBILINOGEN 0.2 09/11/2011 1125   NITRITE neg 10/30/2013 1653   NITRITE NEGATIVE 09/11/2011 1125   LEUKOCYTESUR Negative 10/30/2013 1653     Mariella Blackwelder M.D. [facility] 02/11/2019, 2:26 PM  Pager: [phone] Between 7am to 7pm - call Pager - [phone]  After 7pm go to [email or web address] - password [email or web address]  Call night coverage person covering after 7pm

## 2019-02-11 NOTE — Progress Notes (Signed)
[facility] notified to setup transportation to [facility] for patient's scheduled 1300 Cardiac MRI. [facility] made aware to pick up patient at 1130.

## 2019-02-12 ENCOUNTER — Other Ambulatory Visit: Payer: Self-pay | Admitting: Medical

## 2019-02-12 ENCOUNTER — Telehealth: Payer: Self-pay

## 2019-02-12 DIAGNOSIS — R9431 Abnormal electrocardiogram [ECG] [EKG]: Secondary | ICD-10-CM

## 2019-02-12 DIAGNOSIS — I10 Essential (primary) hypertension: Secondary | ICD-10-CM

## 2019-02-12 DIAGNOSIS — R29818 Other symptoms and signs involving the nervous system: Secondary | ICD-10-CM

## 2019-02-12 DIAGNOSIS — I517 Cardiomegaly: Secondary | ICD-10-CM

## 2019-02-12 MED ORDER — LISINOPRIL 20 MG PO TABS: 20.0000 mg | ORAL_TABLET | Freq: Every day | ORAL | 3 refills | Status: DC

## 2019-02-12 MED ORDER — CARVEDILOL 6.25 MG PO TABS: 6.2500 mg | ORAL_TABLET | Freq: Two times a day (BID) | ORAL | Status: DC

## 2019-02-12 MED ORDER — CARVEDILOL 6.25 MG PO TABS: 6.2500 mg | ORAL_TABLET | Freq: Two times a day (BID) | ORAL | 4 refills | Status: DC

## 2019-02-12 MED ORDER — ATORVASTATIN CALCIUM 80 MG PO TABS: 80.0000 mg | ORAL_TABLET | Freq: Every day | ORAL | 4 refills | Status: AC

## 2019-02-12 NOTE — Progress Notes (Signed)
Pt resting HR in the hi 70's. Pt ambulated in room for 5 min. HR got as high as 106, non sustained, stayed mostly in the 90's. Pt denied pain  Or SOB, but reported fatigue.

## 2019-02-12 NOTE — Telephone Encounter (Signed)
**Note De-identified  Obfuscation** -----  **Note De-Identified  Obfuscation** Message from Abigail Butts, PA-C sent at 02/12/2019 12:00 PM EDT ----- Regarding: TOC appointment Hey there - this patient is scheduled to see Melina Copa 02/19/2019. Can you please place a TOC call.   Thank you!

## 2019-02-12 NOTE — Telephone Encounter (Signed)
The pt has not been discharged from the hospital yet.  Will monitor his chart and call once he has been discharged.

## 2019-02-12 NOTE — Discharge Summary (Signed)
Physician Discharge Summary   Patient ID: Jacob Cannon MRN: [id number] DOB/AGE: 1970-10-10 48 y.o.  Admit date: 02/09/2019 Discharge date: 02/12/2019  Primary Care Physician:  Bernerd Limbo, MD   Recommendations for Outpatient Follow-up:  1. Follow up with PCP in 1-2 weeks 2. Patient referred to genetics clinic for genetic testing for HOCM, recommended outpatient sleep study 3. Follow-up with cardiology in 1 to 2 weeks. 4. Aggressive BP control, repeat hemoglobin A1c in 3 months.  Home Health: None  Equipment/Devices:   Discharge Condition: stable  CODE STATUS: FULL  Diet recommendation: Heart healthy diet   Discharge Diagnoses:    . Exertional chest pain . Essential hypertension . Gastroesophageal reflux disease without esophagitis Possible HOCM Hyperlipidemia Suspected OSA Prediabetes  Consults: Cardiology    Allergies:   Allergies  Allergen Reactions  . Methocarbamol     Chest pain     DISCHARGE MEDICATIONS: Allergies as of 02/12/2019      Reactions   Methocarbamol    Chest pain      Medication List    STOP taking these medications   chlorpheniramine-HYDROcodone 10-8 MG/5ML Suer Commonly known as: TUSSIONEX   MUCINEX PO     TAKE these medications   albuterol 108 (90 Base) MCG/ACT inhaler Commonly known as: VENTOLIN HFA Inhale 2 puffs into the lungs every 6 (six) hours as needed for wheezing or shortness of breath.   atorvastatin 80 MG tablet Commonly known as: LIPITOR Take 1 tablet (80 mg total) by mouth at bedtime. Notes to patient: 02/12/2019 supper    carvedilol 6.25 MG tablet Commonly known as: COREG Take 1 tablet (6.25 mg total) by mouth 2 (two) times daily with a meal. Notes to patient: 02/12/2019 evening meal    HYDROcodone-acetaminophen 5-325 MG tablet Commonly known as: NORCO/VICODIN Take 1 tablet by mouth every 4 (four) hours as needed. What changed:   when to take this  reasons to take this   lisinopril 20 MG  tablet Commonly known as: ZESTRIL Take 1 tablet (20 mg total) by mouth daily. NEED OV. What changed:   medication strength  how much to take Notes to patient: 02/13/2019    nitroGLYCERIN 0.3 MG SL tablet Commonly known as: NITROSTAT Place 1 tablet under the tongue every 5 (five) minutes as needed for chest pain.   pantoprazole 40 MG tablet Commonly known as: PROTONIX Take 1 tablet (40 mg total) by mouth daily with breakfast. What changed: when to take this Notes to patient: 02/13/2019    polyethylene glycol powder 17 GM/SCOOP powder Commonly known as: GLYCOLAX/MIRALAX Take 17 g by mouth daily as needed for moderate constipation.   senna 8.6 MG tablet Commonly known as: SENOKOT Take 1 tablet by mouth daily. Notes to patient: 02/13/2019    triamcinolone cream 0.5 % Commonly known as: KENALOG APPLY TO AFFECTED AREA TWICE A DAY What changed: See the new instructions.        Brief H and P: For complete details please refer to admission H and P, but in brief Patient is a 48 year old male with hypertension, hyperlipidemia, fatty liver, psoriasis, OSA on cpap, normal cardiac catheterization 07/29/2018, apparently noted some chest discomfort while in [facility]. Pt states that when he got home yesterday and was moving luggage back into the house he had a brief episode of substernal chest pain "tightness" without radiation. Pt took slg nitro with relief after about 20 minutes. Pt this morning again had chest tightness at about 9 am. Pt states lasted for about  1 hour 30 minutes til took slg nitro and had relief after about 15 minutes, Slightly diaphoretic, Pt was concerned about the severity of the discomfort and therefore presented to ED for evaluation. Pt points to area just left of sternum and states hurts with palpation. "it feels like something is just moving in his chest" Pt denied fever, chills, palp, sob, n/v, heartburn, abd pain, diarrhea, brbpr, dysuria  COVID-19  test negative [facility] Course:   Atypical chest pain, DOE:  -In the setting of history of hypertension, hyperlipidemia, OSA, history of SCD in brother at 37, father had CHF.  Cardiac cath in 06/2018 had shown normal coronaries. -Troponins negative, d-dimer negative, no ongoing chest pain at rest, feels shortness of breath and intermittent chest pain on ambulation, exertion -EKG had shown LVH and diffuse ST-T wave abnormality -2D echo showed EF > 65%, severely increased left ventricular wall thickness, consider HCM or infiltrative cardiomyopathy grade 1 DD. -Patient underwent cardiac MRI which showed some apical hypertrophy and ? LGE in the septum, equivocal for possible HOCM variant -Cardiology recommended good BP control with beta-blocker, lisinopril, addition of Cardizem to help with hyperdynamic function if beta-blockers are maxed out.  Imdur discontinued. -Patient will be referred to genetic clinic for genetic testing for HOCM, outpatient sleep study    Essential hypertension -BP stable, continue  Coreg, lisinopril    Gastroesophageal reflux disease without esophagitis -Continue PPI    Hypertriglyceridemia -Continue statin    Prediabetes  A1c 6.1 counseled diet and weight control Repeat hemoglobin A1c in 3 months if worsening, will need oral hypoglycemic  Day of Discharge S: No acute complaints, feeling somewhat better, no chest pain  BP 133/74 (BP Location: Right Arm)   Pulse 70   Temp 98.9 F (37.2 C) (Oral)   Resp 18   Ht 5\' 7"  (1.702 m)   Wt 97.6 kg   SpO2 99%   BMI 33.70 kg/m   Physical Exam: General: Alert and awake oriented x3 not in any acute distress. HEENT: anicteric sclera, pupils reactive to light and accommodation CVS: S1-S2 clear no murmur rubs or gallops Chest: clear to auscultation bilaterally, no wheezing rales or rhonchi Abdomen: soft nontender, nondistended, normal bowel sounds Extremities: no cyanosis, clubbing or edema noted  bilaterally Neuro: Cranial nerves II-XII intact, no focal neurological deficits   The results of significant diagnostics from this hospitalization (including imaging, microbiology, ancillary and laboratory) are listed below for reference.      Procedures/Studies:  2D echocardiogram 02/10/2019 IMPRESSIONS    1. The left ventricle has hyperdynamic systolic function, with an ejection fraction of >65%. The cavity size was normal. There is severely increased left ventricular wall thickness. Left ventricular diastolic Doppler parameters are consistent with  impaired relaxation. Indeterminate filling pressures The E/e' is 8-15. No evidence of left ventricular regional wall motion abnormalities.  2. The right ventricle has normal systolic function. The cavity was normal. There is no increase in right ventricular wall thickness.  3. The mitral valve is abnormal. Mild thickening of the mitral valve leaflet.  4. The tricuspid valve is grossly normal.  5. The aortic valve was not well visualized. No stenosis of the aortic valve.    Dg Chest 2 View  Result Date: 02/09/2019 CLINICAL DATA:  Chest pain EXAM: CHEST - 2 VIEW COMPARISON:  July 22, 2018 FINDINGS: Lungs are clear. Heart size and pulmonary vascularity are normal. No adenopathy. No pneumothorax. No bone lesions. IMPRESSION: No edema or consolidation. Electronically Signed   By:  Lowella Grip III M.D.   On: 02/09/2019 13:04   Mr Cardiac Morphology W Wo Contrast  Result Date: 02/11/2019 CLINICAL DATA:  Hypertrophic cardiomyopathy versus infiltrative disease. EXAM: CARDIAC MRI TECHNIQUE: The patient was scanned on a 1.5 Tesla GE magnet. A dedicated cardiac coil was used. Functional imaging was done using Fiesta sequences. 2,3, and 4 chamber views were done to assess for RWMA's. Modified Simpson's rule using a short axis stack was used to calculate an ejection fraction on a dedicated work Conservation officer, nature. The patient  received 10 cc of Gadavist. After 10 minutes inversion recovery sequences were used to assess for infiltration and scar tissue. CONTRAST:  10 cc Gadavist FINDINGS: Limited images of the lung fields showed no gross abnormalities. Normal left ventricular size with mild concentric LV hypertrophy. There is possibly mildly more hypertrophy towards the apex, so cannot fully rule out apical hypertrophic cardiomyopathy variant but not classic. No wall motion abnormalities, vigorous systolic function with EF 72%. Normal right ventricular size and systolic function, EF 11%. Normal right and left atrial sizes. No significant mitral or aortic regurgitation noted. On delayed enhancement images, there may be an area of mid-wall late gadolinium enhancement (LGE) in the mid septum. However, it is not totally clear whether this is not from prominent trabeculation on the RV side of the septum. Difficult images. Measurements: LVEDV 126 mL LVSV 91 mL LVEF 72% RVEDV 120 mL RVSV 81 mL RVEF 68% IMPRESSION: 1. Normal LV size with mild concentric LVH, vigorous systolic function with EF 72%. As above, possibly more hypertrophy towards the apex but not classic for apical hypertrophic cardiomyopathy. It is possible that this is a hypertensive cardiomyopathy. 2.  Normal RV size and systolic function, EF 91%. 3. Difficult LGE images, possible mid-wall LGE in the septum, but cannot rule out this being from prominent trabeculation on the RV size of the septum. Mid-wall LGE would be more in keeping with hypertrophic cardiomyopathy. Difficult study, cannot rule out HCM variant but not definitive. Jacob Cannon Electronically Signed   By: Loralie Champagne M.D.   On: 02/11/2019 18:12   Vas [place] Lower Extremity Venous (dvt) (only [facility])  Result Date: 02/09/2019  Lower Venous Study Indications: Swelling, Chest pain, and SOB.  Comparison Study: 03/27/2014 Left lower extremity negative Performing Technologist: Toma Copier RVS  Examination  Guidelines: A complete evaluation includes B-mode imaging, spectral Doppler, color Doppler, and power Doppler as needed of all accessible portions of each vessel. Bilateral testing is considered an integral part of a complete examination. Limited examinations for reoccurring indications may be performed as noted.  +-----+---------------+---------+-----------+----------+-------+ RIGHTCompressibilityPhasicitySpontaneityPropertiesSummary +-----+---------------+---------+-----------+----------+-------+ CFV  Full           Yes      Yes                          +-----+---------------+---------+-----------+----------+-------+ SFJ  Full                                                 +-----+---------------+---------+-----------+----------+-------+   +---------+---------------+---------+-----------+----------+------------------+ LEFT     CompressibilityPhasicitySpontaneityPropertiesSummary            +---------+---------------+---------+-----------+----------+------------------+ CFV      Full           Yes      Yes                                     +---------+---------------+---------+-----------+----------+------------------+  SFJ      Full                                                            +---------+---------------+---------+-----------+----------+------------------+ FV Prox  Full           Yes      Yes                                     +---------+---------------+---------+-----------+----------+------------------+ FV Mid   Full                                                            +---------+---------------+---------+-----------+----------+------------------+ FV DistalFull           Yes      Yes                                     +---------+---------------+---------+-----------+----------+------------------+ PFV      Full           Yes      Yes                                      +---------+---------------+---------+-----------+----------+------------------+ POP      Full           Yes      Yes                                     +---------+---------------+---------+-----------+----------+------------------+ PTV      Full                                                            +---------+---------------+---------+-----------+----------+------------------+ PERO                                                  Difficult to image +---------+---------------+---------+-----------+----------+------------------+     Summary: Right: There is no evidence of common femoral vein obstruction. Left: Findings appear essentially unchanged compared to previous examination. There is no evidence of deep vein thrombosis in the lower extremity. No cystic structure found in the popliteal fossa.  *See table(s) above for measurements and observations. Electronically signed by Monica Martinez MD on 02/09/2019 at 5:28:47 PM.    Final        LAB RESULTS: Basic Metabolic Panel: Recent Labs  Lab 02/10/19 0452 02/11/19 0459  NA 139 139  K 3.9 4.6  CL 106 109  CO2 23 21*  GLUCOSE 136* 101*  BUN 17  17  CREATININE 1.16 1.09  CALCIUM 8.3* 8.3*  MG  --  2.2  PHOS  --  3.6   Liver Function Tests: Recent Labs  Lab 02/09/19 1234 02/10/19 0452 02/11/19 0459  AST 28 22  --   ALT 20 21  --   ALKPHOS 62 67  --   BILITOT 0.3 0.3  --   PROT 7.3 6.5  --   ALBUMIN 4.3 3.7 3.6   Recent Labs  Lab 02/09/19 1234  LIPASE 29   No results for input(s): AMMONIA in the last 168 hours. CBC: Recent Labs  Lab 02/10/19 0452 02/11/19 0459  WBC 7.5 6.4  NEUTROABS  --  3.0  HGB 16.1 15.6  HCT 49.6 47.9  MCV 91.2 91.1  PLT 182 237   Cardiac Enzymes: No results for input(s): CKTOTAL, CKMB, CKMBINDEX, TROPONINI in the last 168 hours. BNP: Invalid input(s): POCBNP CBG: No results for input(s): GLUCAP in the last 168 hours.    Disposition and Follow-up: Discharge  Instructions    Diet - low sodium heart healthy   Complete by: As directed    Increase activity slowly   Complete by: As directed        DISPOSITION: Home   DISCHARGE FOLLOW-UP Follow-up Information    Charlie Pitter, PA-C Follow up on 02/19/2019.   Specialties: Cardiology, Radiology Why: Please arrive 15 minutes early for your 8:00am post-hospital follow-up appointment Contact information: [address] [place] [address] [phone]            Time coordinating discharge:  35 minutes  Signed:   Estill Cotta M.D. [facility] 02/12/2019, 3:06 PM

## 2019-02-12 NOTE — Plan of Care (Signed)
Discharge instructions reviewed with patient, questions answered, verbalized understanding.  Patient ambulatory to main entrance to drive himself home.

## 2019-02-12 NOTE — Progress Notes (Signed)
Progress Note  Patient Name: Jacob Cannon Date of Encounter: 02/12/2019  Primary Cardiologist: Jenean Lindau, MD   Subjective   Denies recurrent chest pain. No complaints of SOB. MRI results reviewed with patient and his wife, Jacob Cannon, over the phone.   Inpatient Medications    Scheduled Meds:  aspirin EC  325 mg Oral Daily   atorvastatin  80 mg Oral q1800   carvedilol  6.25 mg Oral BID WC   enoxaparin (LOVENOX) injection  40 mg Subcutaneous QHS   lisinopril  20 mg Oral Daily   pantoprazole  40 mg Oral Daily   senna  1 tablet Oral Daily   sodium chloride flush  3 mL Intravenous Q12H   triamcinolone cream  1 application Topical Daily   Continuous Infusions:  sodium chloride     PRN Meds: sodium chloride, acetaminophen **OR** acetaminophen, albuterol, HYDROcodone-acetaminophen, polyethylene glycol, sodium chloride flush   Vital Signs    Vitals:   02/11/19 1549 02/11/19 2057 02/12/19 0512 02/12/19 0934  BP: 121/88 (!) 141/88 120/78 138/77  Pulse: 70 79 72 73  Resp: 18 18 18    Temp: 97.9 F (36.6 C) 98 F (36.7 C) 98.3 F (36.8 C)   TempSrc: Oral Oral    SpO2: 100% 97% 98%   Weight:      Height:       No intake or output data in the 24 hours ending 02/12/19 1102 Filed Weights   02/09/19 2321 02/10/19 0500 02/11/19 0428  Weight: 93.8 kg 94.7 kg 97.6 kg    Telemetry    NSR - Personally Reviewed  Physical Exam   GEN: Sitting on the edge of his hospital bed in no acute distress.   Neck: No JVD, no carotid bruits Cardiac: RRR, no murmurs, rubs, or gallops.  Respiratory: Clear to auscultation bilaterally, no wheezes/ rales/ rhonchi GI: NABS, Soft, nontender, non-distended  MS: No edema; No deformity. Neuro:  Nonfocal, moving all extremities spontaneously Psych: Normal affect   Labs    Chemistry Recent Labs  Lab 02/09/19 1234 02/10/19 0452 02/11/19 0459  NA 139 139 139  K 4.3 3.9 4.6  CL 108 106 109  CO2 21* 23 21*  GLUCOSE 142*  136* 101*  BUN 16 17 17   CREATININE 1.25* 1.16 1.09  CALCIUM 9.1 8.3* 8.3*  PROT 7.3 6.5  --   ALBUMIN 4.3 3.7 3.6  AST 28 22  --   ALT 20 21  --   ALKPHOS 62 67  --   BILITOT 0.3 0.3  --   GFRNONAA >60 >60 >60  GFRAA >60 >60 >60  ANIONGAP 10 10 9      Hematology Recent Labs  Lab 02/09/19 1234 02/10/19 0452 02/11/19 0459  WBC 7.4 7.5 6.4  RBC 5.64 5.44 5.26  HGB 16.3 16.1 15.6  HCT 50.6 49.6 47.9  MCV 89.7 91.2 91.1  MCH 28.9 29.6 29.7  MCHC 32.2 32.5 32.6  RDW 13.6 14.0 13.7  PLT 273 182 237    Cardiac EnzymesNo results for input(s): TROPONINI in the last 168 hours. No results for input(s): TROPIPOC in the last 168 hours.   BNP Recent Labs  Lab 02/09/19 1234  BNP 24.3     DDimer  Recent Labs  Lab 02/09/19 1234  DDIMER <0.27     Radiology    Mr Cardiac Morphology W Wo Contrast  Result Date: 02/11/2019 CLINICAL DATA:  Hypertrophic cardiomyopathy versus infiltrative disease. EXAM: CARDIAC MRI TECHNIQUE: The patient was scanned on a  1.5 Tesla GE magnet. A dedicated cardiac coil was used. Functional imaging was done using Fiesta sequences. 2,3, and 4 chamber views were done to assess for RWMA's. Modified Simpson's rule using a short axis stack was used to calculate an ejection fraction on a dedicated work Conservation officer, nature. The patient received 10 cc of Gadavist. After 10 minutes inversion recovery sequences were used to assess for infiltration and scar tissue. CONTRAST:  10 cc Gadavist FINDINGS: Limited images of the lung fields showed no gross abnormalities. Normal left ventricular size with mild concentric LV hypertrophy. There is possibly mildly more hypertrophy towards the apex, so cannot fully rule out apical hypertrophic cardiomyopathy variant but not classic. No wall motion abnormalities, vigorous systolic function with EF 72%. Normal right ventricular size and systolic function, EF 25%. Normal right and left atrial sizes. No significant mitral or  aortic regurgitation noted. On delayed enhancement images, there may be an area of mid-wall late gadolinium enhancement (LGE) in the mid septum. However, it is not totally clear whether this is not from prominent trabeculation on the RV side of the septum. Difficult images. Measurements: LVEDV 126 mL LVSV 91 mL LVEF 72% RVEDV 120 mL RVSV 81 mL RVEF 68% IMPRESSION: 1. Normal LV size with mild concentric LVH, vigorous systolic function with EF 72%. As above, possibly more hypertrophy towards the apex but not classic for apical hypertrophic cardiomyopathy. It is possible that this is a hypertensive cardiomyopathy. 2.  Normal RV size and systolic function, EF 63%. 3. Difficult LGE images, possible mid-wall LGE in the septum, but cannot rule out this being from prominent trabeculation on the RV size of the septum. Mid-wall LGE would be more in keeping with hypertrophic cardiomyopathy. Difficult study, cannot rule out HCM variant but not definitive. Jacob Cannon Electronically Signed   By: Loralie Champagne M.D.   On: 02/11/2019 18:12    Cardiac Studies   LHC 07/29/2018 LEFT HEART CATH AND CORONARY ANGIOGRAPHY  Conclusion   Left dominant coronary anatomy.  Normal short left main.  Normal large LAD which wraps around the left ventricular apex.  Large dominant circumflex which gives the PDA. The entire circumflex system is normal.  Small nondominant right coronary.  Hyperdynamic left ventricle with EF greater than 70%. Elevated LVEDP raises question of left ventricular hypertrophy/hypertrophic myocardial process. Hemodynamics are consistent with a degree of diastolic dysfunction/chronic diastolic heart failure.  RECOMMENDATIONS:   2D Doppler echocardiogram to assess for evidence of ventricular hypertrophy.  Clinical diagnosis may be INOCA (ischemia with no obstructive coronary artery disease). This could be on the basis of supply demand mismatch from microvascular disease.  Aggressive risk  factor management including blood pressure less than 130/80, lipid-lowering, management of sleep apnea, and weight loss.   Left Heart  Left Ventricle The left ventricular size is normal. There is hyperdynamic left ventricular systolic function. LV end diastolic pressure is severely elevated. The left ventricular ejection fraction is greater than 65% by visual estimate. No regional wall motion abnormalities.   Echocardiogram 02/10/2019: IMPRESSIONS  1. The left ventricle has hyperdynamic systolic function, with an ejection fraction of >65%. The cavity size was normal. There is severely increased left ventricular wall thickness. Left ventricular diastolic Doppler parameters are consistent with  impaired relaxation. Indeterminate filling pressures The E/e' is 8-15. No evidence of left ventricular regional wall motion abnormalities.  2. The right ventricle has normal systolic function. The cavity was normal. There is no increase in right ventricular wall thickness.  3. The mitral  valve is abnormal. Mild thickening of the mitral valve leaflet.  4. The tricuspid valve is grossly normal.  5. The aortic valve was not well visualized. No stenosis of the aortic valve.  SUMMARY   LVEF 65-70%, severe LVH (consider HCM or infiltrative cardiomyopathy), normal wall motion, grade 1 DD, indeterminate LV filling pressure, normal RV systolic function, normal biatrial size, normal IVC  Cardiac MRI 02/11/2019:  IMPRESSION: 1. Normal LV size with mild concentric LVH, vigorous systolic function with EF 72%. As above, possibly more hypertrophy towards the apex but not classic for apical hypertrophic cardiomyopathy. It is possible that this is a hypertensive cardiomyopathy.  2.  Normal RV size and systolic function, EF 31%.  3. Difficult LGE images, possible mid-wall LGE in the septum, but cannot rule out this being from prominent trabeculation on the RV size of the septum. Mid-wall LGE would be more in  keeping with hypertrophic cardiomyopathy.  Difficult study, cannot rule out HCM variant but not definitive.  Patient Profile     48 y.o. male with a hx of "enlarged heart", "murmur since birth" HTN, HLD, GERD, OSA, obesity, fatty liver, psoriasis, family h/o heart disease and normal cardiac cath 06/2018 who is being followed by cardiology for the evaluation of chest pain  Assessment & Plan    1. Possible HOCM: MRI with some evidence of HOCM though not definite.  - Will refer to genetics clinic for ongoing evaluation  2. Exertional Chest pain/DOE: possible this is related to microvascular ischemia. Recent LHC without CAD. Will stop imdur given concern for HCOM. Uptitrate carvedilol to 6.25mg  BID for improved antianginal effects.  - Continue carvedilol - can uptitrate outpatient - Could consider addition of diltiazem if BBlocker maxed and still having angina - No need for ASA   3. HTN: BP stable - Continue carvedilol and lisinopril  4. HLD: LDL 84 this admission - Continue statin  5. Suspected OSA: he reports snoring and wife has noticed apneic episodes during sleep.  - Will order an outpatient sleep study to evaluate for OSA.   [facility] will sign off.   Medication Recommendations:  Continue home lisinopril, continue carvedilol 6.25mg  BID Other recommendations (labs, testing, etc):  Referral to genetics clinic and for sleep study placed Follow up as an outpatient:  Will arrange outpatient visit in 1-2 weeks at our [address] office for close follow-up.     For questions or updates, please contact [facility] Please consult [email or web address] for contact info under Cardiology/STEMI.      Signed, Abigail Butts, PA-C  02/12/2019, 11:02 AM   [phone]

## 2019-02-13 NOTE — Telephone Encounter (Signed)
Patient contacted regarding discharge from [facility] on February 12, 2019.  Patient understands to follow up with provider Melina Copa, PA-c on 02/19/2019 at 8:00 at [facility] in [place]. Patient understands discharge instructions? Yes Patient understands medications and regiment? Yes Patient understands to bring all medications to this visit? Yes

## 2019-02-16 NOTE — Telephone Encounter (Signed)
Follow up  I meant to say that per the previous message she wants to know if the patient is having any procedures done that he would not  Be able to eat before the procedure.

## 2019-02-18 NOTE — Telephone Encounter (Signed)
Hi Jacob Cannon, there are no tests ordered by our office for this patient.  Thank you! Gregary Signs

## 2019-02-19 ENCOUNTER — Telehealth: Payer: Self-pay | Admitting: *Deleted

## 2019-02-19 ENCOUNTER — Ambulatory Visit: Payer: BC Managed Care – PPO | Admitting: Cardiology

## 2019-02-19 ENCOUNTER — Telehealth: Payer: BC Managed Care – PPO | Admitting: Physician Assistant

## 2019-02-19 ENCOUNTER — Other Ambulatory Visit: Payer: Self-pay

## 2019-02-19 ENCOUNTER — Encounter: Payer: Self-pay | Admitting: Cardiology

## 2019-02-19 VITALS — BP 122/78 | HR 71 | Ht 67.0 in | Wt 212.4 lb

## 2019-02-19 DIAGNOSIS — I422 Other hypertrophic cardiomyopathy: Secondary | ICD-10-CM | POA: Diagnosis not present

## 2019-02-19 DIAGNOSIS — I208 Other forms of angina pectoris: Secondary | ICD-10-CM | POA: Diagnosis not present

## 2019-02-19 NOTE — Telephone Encounter (Signed)
-----   Message from Frederik Schmidt, RN sent at 02/19/2019  8:46 AM EDT ----- Regarding: Patient Sleep Schedule Patient needs sleep study scheduled. Orders in   Thank you

## 2019-02-19 NOTE — Progress Notes (Signed)
02/19/2019 Jacob Cannon   23-Aug-1970  [id number]  Primary Physician Bernerd Limbo, MD Primary Cardiologist: Jenean Lindau, MD  Electrophysiologist: None   Reason for Visit/CC: [facility] F/u for CP, Possible HCM variant  HPI:  Jacob Cannon is being seen today for post hospital f/u. He is a 48 y.o.malewith a hx of "enlarged heart", "murmur since birth"HTN, HLD, GERD, OSA,obesity,fatty liver, psoriasis, family h/o heart diseaseand normal cardiac cath 06/2018 (felt possibly to have microvascular angina), who recently was admitted to [facility] for chest pain evaluation. Pt endorsed worsening exertional CP and dyspnea. Troponins were negative. 2D echo showed LVEF 65-70%, severe LVH (consider HCM or infiltrative cardiomyopathy), normal wall motion, grade 1 DD, indeterminate LV filling pressure, normal RV systolic function, normal biatrial size and normal IVC. He was set up for cardiac MRI. This showed some apical hypertrophy and ? LGE in the septum. Equivocal for possible HOCM variant.  Imdur was discontinued due to possible HCM. Treatment w/  blocker therapy was recommended. His Coreg was increased to 6.25 mg BID with recs to further titrate as BP allows. It was also noted that if he had further CP after maxium titration of Coreg to consider addition of Cardizem to help with hyperdynamic function and microvascular disease. Dr. Radford Pax recommended 1-2 week post hospital f/u w/ an APP, followed by office visit with his primary cardiologist, Dr. Geraldo Pitter, in August. She also recommended referral to our genetics clinic as well as sleep study to r/o possibility of OSA.   Today in clinic, he is doing well. No CP. No dyspnea. No LEE. Compliant w/ meds. BP and HR controlled.   Cardiac Studies   [facility] 07/29/2018 LEFT HEART CATH AND CORONARY ANGIOGRAPHY  Conclusion   Left dominant coronary anatomy.  Normal short left main.  Normal large LAD which wraps around the left ventricular apex.   Large dominant circumflex which gives the PDA. The entire circumflex system is normal.  Small nondominant right coronary.  Hyperdynamic left ventricle with EF greater than 70%. Elevated LVEDP raises question of left ventricular hypertrophy/hypertrophic myocardial process. Hemodynamics are consistent with a degree of diastolic dysfunction/chronic diastolic heart failure.  RECOMMENDATIONS:   2D Doppler echocardiogram to assess for evidence of ventricular hypertrophy.  Clinical diagnosis may be INOCA (ischemia with no obstructive coronary artery disease). This could be on the basis of supply demand mismatch from microvascular disease.  Aggressive risk factor management including blood pressure less than 130/80, lipid-lowering, management of sleep apnea, and weight loss.   Left Heart  Left Ventricle The left ventricular size is normal. There is hyperdynamic left ventricular systolic function. LV end diastolic pressure is severely elevated. The left ventricular ejection fraction is greater than 65% by visual estimate. No regional wall motion abnormalities.   Echocardiogram 02/10/2019: IMPRESSIONS  1. The left ventricle has hyperdynamic systolic function, with an ejection fraction of >65%. The cavity size was normal. There is severely increased left ventricular wall thickness. Left ventricular diastolic Doppler parameters are consistent with  impaired relaxation. Indeterminate filling pressures The E/e' is 8-15. No evidence of left ventricular regional wall motion abnormalities. 2. The right ventricle has normal systolic function. The cavity was normal. There is no increase in right ventricular wall thickness. 3. The mitral valve is abnormal. Mild thickening of the mitral valve leaflet. 4. The tricuspid valve is grossly normal. 5. The aortic valve was not well visualized. No stenosis of the aortic valve.  SUMMARY  LVEF 65-70%, severe LVH (consider HCM or infiltrative  cardiomyopathy), normal wall motion, grade 1 DD, indeterminate LV filling pressure, normal RV systolic function, normal biatrial size, normal IVC  Cardiac MRI 02/11/2019:  IMPRESSION: 1. Normal LV size with mild concentric LVH, vigorous systolic function with EF 72%. As above, possibly more hypertrophy towards the apex but not classic for apical hypertrophic cardiomyopathy. It is possible that this is a hypertensive cardiomyopathy.  2. Normal RV size and systolic function, EF 39%.  3. Difficult LGE images, possible mid-wall LGE in the septum, but cannot rule out this being from prominent trabeculation on the RV size of the septum. Mid-wall LGE would be more in keeping with hypertrophic cardiomyopathy.  Difficult study, cannot rule out HCM variant but not definitive.   Current Meds  Medication Sig  . albuterol (VENTOLIN HFA) 108 (90 Base) MCG/ACT inhaler Inhale 2 puffs into the lungs every 6 (six) hours as needed for wheezing or shortness of breath.   Marland Kitchen atorvastatin (LIPITOR) 80 MG tablet Take 1 tablet (80 mg total) by mouth at bedtime.  . carvedilol (COREG) 6.25 MG tablet Take 1 tablet (6.25 mg total) by mouth 2 (two) times daily with a meal.  . lisinopril (ZESTRIL) 20 MG tablet Take 1 tablet (20 mg total) by mouth daily. NEED OV.  . pantoprazole (PROTONIX) 40 MG tablet Take 1 tablet (40 mg total) by mouth daily with breakfast.  . polyethylene glycol powder (GLYCOLAX/MIRALAX) powder Take 17 g by mouth daily as needed for moderate constipation.   . senna (SENOKOT) 8.6 MG tablet Take 1 tablet by mouth daily.  Marland Kitchen triamcinolone cream (KENALOG) 0.5 % APPLY TO AFFECTED AREA TWICE A DAY  . [DISCONTINUED] nitroGLYCERIN (NITROSTAT) 0.3 MG SL tablet Place 1 tablet under the tongue every 5 (five) minutes as needed for chest pain.    Allergies  Allergen Reactions  . Methocarbamol     Chest pain   Past Medical History:  Diagnosis Date  . Arthritis    type?  . Cardiomegaly 2010   [facility]  . Chest pain Chronic  . GERD (gastroesophageal reflux disease)   . Heart murmur congenital  . Hepatic steatosis   . Hyperlipidemia   . Hypertension   . OSA on CPAP 2010  . Psoriasis    used to see derm   . Sleep apnea    Family History  Problem Relation Age of Onset  . Multiple sclerosis Mother   . Diabetes Mother   . Heart attack Father   . Diabetes Brother   . Heart disease Maternal Grandmother   . Diabetes Maternal Uncle   . Colon cancer Neg Hx   . Prostate cancer Neg Hx    Past Surgical History:  Procedure Laterality Date  . CARDIAC CATHETERIZATION    . ESOPHAGOGASTRODUODENOSCOPY  09/13/2011   Procedure: ESOPHAGOGASTRODUODENOSCOPY (EGD);  Surgeon: Estanislado Emms., MD,FACG;  Location: [facility];  Service: Endoscopy;  Laterality: N/A;  . INGUINAL HERNIA REPAIR Right   . LEFT HEART CATH AND CORONARY ANGIOGRAPHY N/A 07/29/2018   Procedure: LEFT HEART CATH AND CORONARY ANGIOGRAPHY;  Surgeon: Belva Crome, MD;  Location: [facility];  Service: Cardiovascular;  Laterality: N/A;   Social History   Socioeconomic History  . Marital status: Married    Spouse name: Not on file  . Number of children: 3  . Years of education: Not on file  . Highest education level: Not on file  Occupational History    Employer: [employer]  Social Needs  . Financial resource strain:  Not on file  . Food insecurity    Worry: Not on file    Inability: Not on file  . Transportation needs    Medical: Not on file    Non-medical: Not on file  Tobacco Use  . Smoking status: Former Smoker    Packs/day: 2.00    Types: Cigars    Quit date: 04/19/2001    Years since quitting: 17.8  . Smokeless tobacco: Never Used  Substance and Sexual Activity  . Alcohol use: Yes    Comment: wine- occasionally  . Drug use: No  . Sexual activity: Never  Lifestyle  . Physical activity    Days per week: Not on file    Minutes per session: Not on file  . Stress: Not on file   Relationships  . Social Herbalist on phone: Not on file    Gets together: Not on file    Attends religious service: Not on file    Active member of club or organization: Not on file    Attends meetings of clubs or organizations: Not on file    Relationship status: Not on file  . Intimate partner violence    Fear of current or ex partner: Not on file    Emotionally abused: Not on file    Physically abused: Not on file    Forced sexual activity: Not on file  Other Topics Concern  . Not on file  Social History Narrative   3 biological kids and 5 adopted---     Lipid Panel     Component Value Date/Time   CHOL 194 02/10/2019 0452   TRIG 380 (H) 02/10/2019 0452   HDL 34 (L) 02/10/2019 0452   CHOLHDL 5.7 02/10/2019 0452   VLDL 76 (H) 02/10/2019 0452   LDLCALC 84 02/10/2019 0452   LDLDIRECT 150.9 01/12/2011 1002    Review of Systems: General: negative for chills, fever, night sweats or weight changes.  Cardiovascular: negative for chest pain, dyspnea on exertion, edema, orthopnea, palpitations, paroxysmal nocturnal dyspnea or shortness of breath Dermatological: negative for rash Respiratory: negative for cough or wheezing Urologic: negative for hematuria Abdominal: negative for nausea, vomiting, diarrhea, bright red blood per rectum, melena, or hematemesis Neurologic: negative for visual changes, syncope, or dizziness All other systems reviewed and are otherwise negative except as noted above.   Physical Exam:  Blood pressure 122/78, pulse 71, height 5\' 7"  (1.702 m), weight 212 lb 6.4 oz (96.3 kg), SpO2 97 %.  General appearance: alert, cooperative and no distress Neck: no carotid bruit and no JVD Lungs: clear to auscultation bilaterally Heart: regular rate and rhythm, S1, S2 normal, no murmur, click, rub or gallop Extremities: extremities normal, atraumatic, no cyanosis or edema Pulses: 2+ and symmetric Skin: Skin color, texture, turgor normal. No rashes or  lesions Neurologic: Grossly normal  EKG not performed -- personally reviewed   ASSESSMENT AND PLAN:   1. 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 Cardiac MRI equivocal. Treating w/  blocker (coreg). Consider later addition of Cardizem if recurrent CP, despite maximally tolerate  blocker dose. Avoid nitrates. Imdur has been discontinued. Per recommendations from MD, we will refer to our genetics clinic.   2. 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 Continue mangement w/ Coreg. Nitrates discontinued due to possible HCM. We discussed this again in clinic today and he knows to no longer use SL NTG that was  previously prescribed. Will remove from med list.   3. Chronic Diastolic HF: evuolemic on exam. No dyspnea. No diuretic requirement. BP and HR controlled. Continue  blocker  4. HTN: BP is well controlled on current regimen.   5. ? OSA: referral for sleep study placed.    Follow-Up w/ Dr. Geraldo Pitter in [place] in 4 weeks.   Brittainy Ladoris Gene, MHS [facility] 02/19/2019 10:08 AM

## 2019-02-19 NOTE — Patient Instructions (Signed)
Medication Instructions:  STOP Nitroglycerin If you need a refill on your cardiac medications before your next appointment, please call your pharmacy.   Lab work: none If you have labs (blood work) drawn today and your tests are completely normal, you will receive your results only by: Marland Kitchen MyChart Message (if you have MyChart) OR . A paper copy in the mail If you have any lab test that is abnormal or we need to change your treatment, we will call you to review the results.  Testing/Procedures: Genetics Clinic Referral with Dr Broadus John Hypertrophic Cardiomyopathy  Follow-Up: 48 WEEKS with Dr Geraldo Pitter in [facility] office At [facility], you and your health needs are our priority.  As part of our continuing mission to provide you with exceptional heart care, we have created designated Provider Care Teams.  These Care Teams include your primary Cardiologist (physician) and Advanced Practice Providers (APPs -  Physician Assistants and Nurse Practitioners) who all work together to provide you with the care you need, when you need it. .   Any Other Special Instructions Will Be Listed Below (If Applicable).

## 2019-02-25 ENCOUNTER — Telehealth: Payer: Self-pay | Admitting: *Deleted

## 2019-02-25 NOTE — Telephone Encounter (Signed)
-----   Message from Freada Bergeron, [place] sent at 02/19/2019  9:04 AM EDT ----- Regarding: FW: Patient Sleep Schedule precert ----- Message ----- From: Frederik Schmidt, RN Sent: 02/19/2019   8:46 AM EDT To: Freada Bergeron, CMA Subject: Patient Sleep Schedule                         Patient needs sleep study scheduled. Orders in   Thank you

## 2019-02-25 NOTE — Telephone Encounter (Signed)
Staff message sent to Gae Bon per [facility] no PA is required. Ok to schedule sleep study. Call reference # [id number].

## 2019-02-27 ENCOUNTER — Telehealth: Payer: Self-pay | Admitting: *Deleted

## 2019-02-27 NOTE — Telephone Encounter (Signed)
Called LMTCB on cell.

## 2019-02-27 NOTE — Telephone Encounter (Signed)
-----   Message from Lauralee Evener, [place] sent at 02/25/2019  3:12 PM EDT ----- Regarding: RE: Patient Sleep Schedule Per [facility] no PA is required. Ok to schedule sleep study. ----- Message ----- From: Freada Bergeron, CMA Sent: 02/19/2019   9:04 AM EDT To: [facility] Subject: FW: Patient Sleep Schedule                     precert ----- Message ----- From: Frederik Schmidt, RN Sent: 02/19/2019   8:46 AM EDT To: Freada Bergeron, CMA Subject: Patient Sleep Schedule                         Patient needs sleep study scheduled. Orders in   Thank you

## 2019-03-03 ENCOUNTER — Telehealth: Payer: Self-pay | Admitting: *Deleted

## 2019-03-03 NOTE — Telephone Encounter (Signed)
-----   Message from Lauralee Evener, [place] sent at 02/25/2019  3:12 PM EDT ----- Regarding: RE: Patient Sleep Schedule Per [facility] no PA is required. Ok to schedule sleep study. ----- Message ----- From: Freada Bergeron, CMA Sent: 02/19/2019   9:04 AM EDT To: [facility] Subject: FW: Patient Sleep Schedule                     precert ----- Message ----- From: Frederik Schmidt, RN Sent: 02/19/2019   8:46 AM EDT To: Freada Bergeron, CMA Subject: Patient Sleep Schedule                         Patient needs sleep study scheduled. Orders in   Thank you

## 2019-03-03 NOTE — Telephone Encounter (Signed)
Patient is scheduled for lab study on 03/13/19. SHE is scheduled for COVID screening on 03/10/19 prior to sleep study.   Patient understands her sleep study will be done at [facility] sleep lab. Patient understands she will receive a sleep packet in a week or so. Patient understands to call if she does not receive the sleep packet in a timely manner. Patient agrees with treatment and thanked me for call Left detailed message on voicemail with date and time of SS and informed patient to call back to confirm or reschedule.

## 2019-03-10 ENCOUNTER — Other Ambulatory Visit (HOSPITAL_COMMUNITY): Payer: BC Managed Care – PPO

## 2019-03-10 ENCOUNTER — Other Ambulatory Visit (HOSPITAL_COMMUNITY): Admission: RE | Admit: 2019-03-10 | Payer: BC Managed Care – PPO | Source: Ambulatory Visit

## 2019-03-13 ENCOUNTER — Encounter (HOSPITAL_BASED_OUTPATIENT_CLINIC_OR_DEPARTMENT_OTHER): Payer: BC Managed Care – PPO | Admitting: Cardiology

## 2019-04-09 ENCOUNTER — Encounter (INDEPENDENT_AMBULATORY_CARE_PROVIDER_SITE_OTHER): Payer: Self-pay

## 2019-04-09 ENCOUNTER — Ambulatory Visit: Payer: BC Managed Care – PPO | Admitting: Genetic Counselor

## 2019-04-09 ENCOUNTER — Other Ambulatory Visit: Payer: Self-pay

## 2019-04-19 NOTE — Progress Notes (Signed)
Pre Test [facility]  Referral Reason  Aryash Vanous was referred for genetic consult and testing of HCM by Lyda Jester, NP subsequent to a recent cardiac MRI that indicated apical wall thickness suggestive of HCM.  Genetic Consultation Notes  Yoltzin was counseled on the genetics of hypertrophic cardiomyopathy (HCM), namely its autosomal dominant inheritance. We also talked about incomplete penetrance, variable expression and digenic/compound mutations that can be seen in some patients with HCM. We briefly discussed the inheritance pattern and treatment plans for the infiltrative cardiomyopathies that present as HCM phenocopies.   We walked through the process of genetic testing. I explained to him that there are three possible outcomes of genetic testing; namely positive, negative and variant of unknown significance. A positive outcome can be expected in about 70%-75% of HCM cases that do not have risk factors for HCM, present early in life with increased severity and have a family history of sudden cardiac death and/or a relative that has been diagnosed with HCM. A negative test does not exclude a genetic basis for HCM. Limitations in current genetic testing methodology can produce a negative result. Variants of unknown significance (VUS) are typically observed in about 10%-15% of cases. However, this number can increase if genetic testing is performed by a laboratory that employs extremely large gene panels of 9 or more sarcomeric genes. I explained to him that typically a VUS is so classified if the variant is not well understood as very few individuals have been reported to harbor this variant or its role in gene function has not been elucidated. The potential outcomes of genetic testing and subsequent management of at-risk family members were discussed so as to manage expectations. His medical and 5-generation family history was obtained. See details below-  Finney  (III.5 on pedigree) is a very pleasant 48 year-old [place] American gentleman. He had a heart murmur at birth. About 15 years ago, at age 48 he was told that he had an enlarged heart. In 2005, he began experiencing chest tightness and pain, shortness of breath, heart palpitations, fatigue and had several episodes of dizziness. He was evaluated at that time and was found to have an enlarged heart. He tells me that he lost his health insurance subsequent to his employer merging with another company, and hence did not pursue medical care for his condition at that time.  Recently, he experienced chest tightness while walking at [facility] and had a similar episode after he got back home to [place]. He tells me that he took nitroglycerine and rested. The next day at work, he felt his chest tighten and had severe chest pain. He was admitted to the hospital and underwent several imaging tests. Cardiac MRI of 02/11/19 indicated apical hypertrophy with scar ring and EF of 68%.   He was diagnosed with sleep apnea in 2012 and was scheduled for a sleep study two weeks ago.  Traditional Risk Factors Warrick was diagnosed with hypertension in 2005 and states that it was well controlled at that time. However, lately he had to adjust is medication and dosage to control his hypertension.   He does not have the other risk factors that can lead to cardiac hypertrophy, namely aortic valve stenosis or participating in competitive athletic activities.  Family history Levente (III.5) is the only biological child of his parents. He has older half-siblings (same dad) (III.1-III.4) none with an overt heart condition. Roselie Awkward has three kids; two daughters ages 63 (IV.2) and 62 (IV.4) and a 48  year-old son (IV.3) who are also in good health as are their children (V.1-V.6).  Anthonys father (II.2) was diagnosed with HCM in his 35s. Jahdiel states that he his fathers symptoms were very similar to his. He was told that his  father died of microvascular ischemia. His family was told that he was going to die and died at the hospital. He recollects seeing his fathers feet swell and he quit working at age 3. Anthonys paternal grandmother (I.2) also died of a heart conditions, but he does not know the details.   Anthonys mother (II.3) is now 31. She is in a nursing home and has heart rhythm issues and sleep apnea. Her brother (II.4) died at 25 from complications of diabetes. His maternal cousin (III.7) had a massive heart attack while driving and had a pacemaker at 81.  Impression  In summary, Anthonys presentation at an early age in the absence of risk factors and a significant family history of HCM in a first-degree relative is indicative of a genetic condition. It is highly likely that he has inherited this condition from his father. Since HCM is an autosomal dominant condition, his children are at a 50% risk of inheriting his condition.  Genetic testing for HCM is highly recommended to confirm his diagnosis. However, if he is found to have a HCM phenocopy that has X-linked inheritance, then his treatment and surveillance of his first-degree relatives will be impacted. He verbalized understanding.  In addition, we discussed the protections afforded by the Genetic Information Non-Discrimination Act (GINA). I explained to him that GINA protects him from losing his employment or health insurance based on his genotype. However, these protections do not cover life insurance and disability. He verbalized understanding of this.  Please note that the patient has not been counseled in this visit on personal, cultural or ethical issues that he may face due to his heart condition.   Plan Traysen is interested in genetic testing for HCM. Blood was drawn today and sent out for testing.     Lattie Corns, Ph.D, [facility] Clinical Molecular Geneticist

## 2019-04-24 ENCOUNTER — Ambulatory Visit (INDEPENDENT_AMBULATORY_CARE_PROVIDER_SITE_OTHER): Payer: BC Managed Care – PPO | Admitting: Cardiology

## 2019-04-24 ENCOUNTER — Other Ambulatory Visit: Payer: Self-pay

## 2019-04-24 ENCOUNTER — Encounter: Payer: Self-pay | Admitting: Cardiology

## 2019-04-24 VITALS — BP 130/96 | HR 79 | Ht 67.0 in | Wt 215.0 lb

## 2019-04-24 DIAGNOSIS — I10 Essential (primary) hypertension: Secondary | ICD-10-CM | POA: Diagnosis not present

## 2019-04-24 DIAGNOSIS — E781 Pure hyperglyceridemia: Secondary | ICD-10-CM | POA: Diagnosis not present

## 2019-04-24 DIAGNOSIS — Z9989 Dependence on other enabling machines and devices: Secondary | ICD-10-CM | POA: Diagnosis not present

## 2019-04-24 DIAGNOSIS — G4733 Obstructive sleep apnea (adult) (pediatric): Secondary | ICD-10-CM

## 2019-04-24 MED ORDER — METOPROLOL SUCCINATE ER 50 MG PO TB24: 50.0000 mg | ORAL_TABLET | Freq: Every day | ORAL | 4 refills | Status: DC

## 2019-04-24 NOTE — Progress Notes (Signed)
Cardiology Office Note:    Date:  04/24/2019   ID:  Jacob Cannon, DOB 11/06/1970, MRN [id number]  PCP:  Bernerd Limbo, MD  Cardiologist:  Jenean Lindau, MD   Referring MD: Bernerd Limbo, MD    ASSESSMENT:    1. Essential hypertension   2. OSA on CPAP   3. Hypertriglyceridemia    PLAN:    In order of problems listed above:  1. Primary prevention stressed with the patient.  Importance of compliance with diet and medication stressed and he vocalized understanding. 2. Essential hypertension: Blood pressure stable.  Salt issues were discussed.  He requests to change carvedilol because of possible drowsiness and fatigue with the medicine and I change it to Toprol-XL 50 mg daily.  He will be back in a month for follow-up.  At that time he will bring his blood pressure machine to her office to check and he will get blood pressure readings also. 3. Obesity: Diet was discussed and the importance of regular exercise stressed and he vocalized understanding.  Diet was explained for dyslipidemia and he promises to do better. 4. Follow-up appointment in a month with my colleague Urban Gibson or earlier if he has any concerns.   Medication Adjustments/Labs and Tests Ordered: Current medicines are reviewed at length with the patient today.  Concerns regarding medicines are outlined above.  No orders of the defined types were placed in this encounter.  No orders of the defined types were placed in this encounter.    Chief Complaint  Patient presents with  . Follow-up     History of Present Illness:    Jacob Cannon is a 48 y.o. male.  Patient has past medical history of essential hypertension.  He denies any problems at this time except feeling fatigued with carvedilol.  No chest pain orthopnea or PND.  At the time of my evaluation, the patient is alert awake oriented and in no distress.  Past Medical History:  Diagnosis Date  . Arthritis    type?  . Cardiomegaly 2010   [facility]  . Chest pain Chronic  . GERD (gastroesophageal reflux disease)   . Heart murmur congenital  . Hepatic steatosis   . Hyperlipidemia   . Hypertension   . OSA on CPAP 2010  . Psoriasis    used to see derm   . Sleep apnea     Past Surgical History:  Procedure Laterality Date  . CARDIAC CATHETERIZATION    . ESOPHAGOGASTRODUODENOSCOPY  09/13/2011   Procedure: ESOPHAGOGASTRODUODENOSCOPY (EGD);  Surgeon: Estanislado Emms., MD,FACG;  Location: [facility];  Service: Endoscopy;  Laterality: N/A;  . INGUINAL HERNIA REPAIR Right   . LEFT HEART CATH AND CORONARY ANGIOGRAPHY N/A 07/29/2018   Procedure: LEFT HEART CATH AND CORONARY ANGIOGRAPHY;  Surgeon: Belva Crome, MD;  Location: [facility];  Service: Cardiovascular;  Laterality: N/A;    Current Medications: Current Meds  Medication Sig  . albuterol (VENTOLIN HFA) 108 (90 Base) MCG/ACT inhaler Inhale 2 puffs into the lungs every 6 (six) hours as needed for wheezing or shortness of breath.   Marland Kitchen atorvastatin (LIPITOR) 80 MG tablet Take 1 tablet (80 mg total) by mouth at bedtime.  . carvedilol (COREG) 6.25 MG tablet Take 1 tablet (6.25 mg total) by mouth 2 (two) times daily with a meal.  . lisinopril (ZESTRIL) 20 MG tablet Take 1 tablet (20 mg total) by mouth daily. NEED OV.  . pantoprazole (PROTONIX) 40 MG tablet Take 1  tablet (40 mg total) by mouth daily with breakfast.  . polyethylene glycol powder (GLYCOLAX/MIRALAX) powder Take 17 g by mouth daily as needed for moderate constipation.   . senna (SENOKOT) 8.6 MG tablet Take 1 tablet by mouth daily.  Marland Kitchen triamcinolone cream (KENALOG) 0.5 % APPLY TO AFFECTED AREA TWICE A DAY     Allergies:   Methocarbamol   Social History   Socioeconomic History  . Marital status: Married    Spouse name: Not on file  . Number of children: 3  . Years of education: Not on file  . Highest education level: Not on file  Occupational History    Employer: [employer]  Social Needs   . Financial resource strain: Not on file  . Food insecurity    Worry: Not on file    Inability: Not on file  . Transportation needs    Medical: Not on file    Non-medical: Not on file  Tobacco Use  . Smoking status: Former Smoker    Packs/day: 2.00    Types: Cigars    Quit date: 04/19/2001    Years since quitting: 18.0  . Smokeless tobacco: Never Used  Substance and Sexual Activity  . Alcohol use: Yes    Comment: wine- occasionally  . Drug use: No  . Sexual activity: Never  Lifestyle  . Physical activity    Days per week: Not on file    Minutes per session: Not on file  . Stress: Not on file  Relationships  . Social Herbalist on phone: Not on file    Gets together: Not on file    Attends religious service: Not on file    Active member of club or organization: Not on file    Attends meetings of clubs or organizations: Not on file    Relationship status: Not on file  Other Topics Concern  . Not on file  Social History Narrative   3 biological kids and 5 adopted---     Family History: The patient's family history includes Diabetes in his brother, maternal uncle, and mother; Heart attack in his father; Heart disease in his maternal grandmother; Multiple sclerosis in his mother. There is no history of Colon cancer or Prostate cancer.  ROS:   Please see the history of present illness.    All other systems reviewed and are negative.  EKGs/Labs/Other Studies Reviewed:    The following studies were reviewed today: I discussed my findings with the patient at length.  Coronary angiography revealed a hyperdynamic left ventricle.   Recent Labs: 02/09/2019: B Natriuretic Peptide 24.3 02/10/2019: ALT 21 02/11/2019: BUN 17; Creatinine, Ser 1.09; Hemoglobin 15.6; Magnesium 2.2; Platelets 237; Potassium 4.6; Sodium 139  Recent Lipid Panel    Component Value Date/Time   CHOL 194 02/10/2019 0452   TRIG 380 (H) 02/10/2019 0452   HDL 34 (L) 02/10/2019 0452   CHOLHDL 5.7  02/10/2019 0452   VLDL 76 (H) 02/10/2019 0452   LDLCALC 84 02/10/2019 0452   LDLDIRECT 150.9 01/12/2011 1002    Physical Exam:    VS:  BP (!) 130/96 (BP Location: Right Arm, Patient Position: Sitting, Cuff Size: Normal)   Pulse 79   Ht 5\' 7"  (1.702 m)   Wt 215 lb (97.5 kg)   SpO2 97%   BMI 33.67 kg/m     Wt Readings from Last 3 Encounters:  04/24/19 215 lb (97.5 kg)  02/19/19 212 lb 6.4 oz (96.3 kg)  02/11/19 215 lb  2.7 oz (97.6 kg)     GEN: Patient is in no acute distress HEENT: Normal NECK: No JVD; No carotid bruits LYMPHATICS: No lymphadenopathy CARDIAC: Hear sounds regular, 2/6 systolic murmur at the apex. RESPIRATORY:  Clear to auscultation without rales, wheezing or rhonchi  ABDOMEN: Soft, non-tender, non-distended MUSCULOSKELETAL:  No edema; No deformity  SKIN: Warm and dry NEUROLOGIC:  Alert and oriented x 3 PSYCHIATRIC:  Normal affect   Signed, Jenean Lindau, MD  04/24/2019 10:46 AM    [place]

## 2019-04-24 NOTE — Patient Instructions (Signed)
Medication Instructions:  Your physician has recommended you make the following change in your medication:   STOP taking coreg  START taking toprol XL 50 mg (1 tablet) once daily  If you need a refill on your cardiac medications before your next appointment, please call your pharmacy.   Lab work: Your physician recommends that you have a BMP drawn today  If you have labs (blood work) drawn today and your tests are completely normal, you will receive your results only by: Marland Kitchen MyChart Message (if you have MyChart) OR . A paper copy in the mail If you have any lab test that is abnormal or we need to change your treatment, we will call you to review the results.  Testing/Procedures: NONE  Follow-Up: At [facility], you and your health needs are our priority.  As part of our continuing mission to provide you with exceptional heart care, we have created designated Provider Care Teams.  These Care Teams include your primary Cardiologist (physician) and Advanced Practice Providers (APPs -  Physician Assistants and Nurse Practitioners) who all work together to provide you with the care you need, when you need it. You will need a follow up appointment in 1 months.   Any Other Special Instructions Will Be Listed Below  Metoprolol extended-release tablets What is this medicine? METOPROLOL (me TOE proe lole) is a beta-blocker. Beta-blockers reduce the workload on the heart and help it to beat more regularly. This medicine is used to treat high blood pressure and to prevent chest pain. It is also used to after a heart attack and to prevent an additional heart attack from occurring. This medicine may be used for other purposes; ask your health care provider or pharmacist if you have questions. COMMON BRAND NAME(S): toprol, Toprol XL What should I tell my health care provider before I take this medicine? They need to know if you have any of these conditions:  diabetes  heart or vessel disease  like slow heart rate, worsening heart failure, heart block, sick sinus syndrome or Raynaud's disease  kidney disease  liver disease  lung or breathing disease, like asthma or emphysema  pheochromocytoma  thyroid disease  an unusual or allergic reaction to metoprolol, other beta-blockers, medicines, foods, dyes, or preservatives  pregnant or trying to get pregnant  breast-feeding How should I use this medicine? Take this medicine by mouth with a glass of water. Follow the directions on the prescription label. Do not crush or chew. Take this medicine with or immediately after meals. Take your doses at regular intervals. Do not take more medicine than directed. Do not stop taking this medicine suddenly. This could lead to serious heart-related effects. Talk to your pediatrician regarding the use of this medicine in children. While this drug may be prescribed for children as young as 6 years for selected conditions, precautions do apply. Overdosage: If you think you have taken too much of this medicine contact a poison control center or emergency room at once. NOTE: This medicine is only for you. Do not share this medicine with others. What if I miss a dose? If you miss a dose, take it as soon as you can. If it is almost time for your next dose, take only that dose. Do not take double or extra doses. What may interact with this medicine? This medicine may interact with the following medications:  certain medicines for blood pressure, heart disease, irregular heart beat  certain medicines for depression, like monoamine oxidase (MAO) inhibitors, fluoxetine,  or paroxetine  clonidine  dobutamine  epinephrine  isoproterenol  reserpine This list may not describe all possible interactions. Give your health care provider a list of all the medicines, herbs, non-prescription drugs, or dietary supplements you use. Also tell them if you smoke, drink alcohol, or use illegal drugs. Some items  may interact with your medicine. What should I watch for while using this medicine? Visit your doctor or health care professional for regular check ups. Contact your doctor right away if your symptoms worsen. Check your blood pressure and pulse rate regularly. Ask your health care professional what your blood pressure and pulse rate should be, and when you should contact them. You may get drowsy or dizzy. Do not drive, use machinery, or do anything that needs mental alertness until you know how this medicine affects you. Do not sit or stand up quickly, especially if you are an older patient. This reduces the risk of dizzy or fainting spells. Contact your doctor if these symptoms continue. Alcohol may interfere with the effect of this medicine. Avoid alcoholic drinks. This medicine may increase blood sugar. Ask your healthcare provider if changes in diet or medicines are needed if you have diabetes. What side effects may I notice from receiving this medicine? Side effects that you should report to your doctor or health care professional as soon as possible:  allergic reactions like skin rash, itching or hives  cold or numb hands or feet  depression  difficulty breathing  faint  fever with sore throat  irregular heartbeat, chest pain  rapid weight gain   signs and symptoms of high blood sugar such as being more thirsty or hungry or having to urinate more than normal. You may also feel very tired or have blurry vision.  swollen legs or ankles Side effects that usually do not require medical attention (report to your doctor or health care professional if they continue or are bothersome):  anxiety or nervousness  change in sex drive or performance  dry skin  headache  nightmares or trouble sleeping  short term memory loss  stomach upset or diarrhea This list may not describe all possible side effects. Call your doctor for medical advice about side effects. You may report side  effects to FDA at 1-800-FDA-1088. Where should I keep my medicine? Keep out of the reach of children. Store at room temperature between 15 and 30 degrees C (59 and 86 degrees F). Throw away any unused medicine after the expiration date. NOTE: This sheet is a summary. It may not cover all possible information. If you have questions about this medicine, talk to your doctor, pharmacist, or health care provider.  2020 Elsevier/Gold Standard (2018-05-06 11:09:41)

## 2019-04-25 LAB — BASIC METABOLIC PANEL
BUN/Creatinine Ratio: 15 (ref 9–20)
BUN: 16 mg/dL (ref 6–24)
CO2: 22 mmol/L (ref 20–29)
Calcium: 9.6 mg/dL (ref 8.7–10.2)
Chloride: 106 mmol/L (ref 96–106)
Creatinine, Ser: 1.09 mg/dL (ref 0.76–1.27)
GFR calc Af Amer: 92 mL/min/{1.73_m2} (ref >59)
GFR calc non Af Amer: 80 mL/min/{1.73_m2} (ref >59)
Glucose: 88 mg/dL (ref 65–99)
Potassium: 4.7 mmol/L (ref 3.5–5.2)
Sodium: 143 mmol/L (ref 134–144)

## 2019-04-29 ENCOUNTER — Telehealth: Payer: Self-pay

## 2019-04-29 NOTE — Telephone Encounter (Signed)
Left message on home phone that results were ok. Copy sent to Dr. Coletta Memos per Dr. Docia Furl request.

## 2019-04-29 NOTE — Telephone Encounter (Signed)
-----   Message from Jenean Lindau, MD sent at 04/28/2019 11:33 AM EDT ----- The results of the study is unremarkable. Please inform patient. I will discuss in detail at next appointment. Cc  primary care/referring physician Jenean Lindau, MD 04/28/2019 11:32 AM

## 2019-05-27 ENCOUNTER — Emergency Department (HOSPITAL_COMMUNITY)
Admission: EM | Admit: 2019-05-27 | Discharge: 2019-05-27 | Disposition: A | Discharge status: Home / Self Care | Payer: BC Managed Care – PPO | Attending: Emergency Medicine | Admitting: Emergency Medicine

## 2019-05-27 ENCOUNTER — Encounter (HOSPITAL_COMMUNITY): Payer: Self-pay | Admitting: Emergency Medicine

## 2019-05-27 ENCOUNTER — Other Ambulatory Visit: Payer: Self-pay

## 2019-05-27 DIAGNOSIS — M62838 Other muscle spasm: Secondary | ICD-10-CM | POA: Diagnosis not present

## 2019-05-27 DIAGNOSIS — M542 Cervicalgia: Secondary | ICD-10-CM | POA: Diagnosis present

## 2019-05-27 DIAGNOSIS — Z87891 Personal history of nicotine dependence: Secondary | ICD-10-CM | POA: Diagnosis not present

## 2019-05-27 DIAGNOSIS — I10 Essential (primary) hypertension: Secondary | ICD-10-CM | POA: Diagnosis not present

## 2019-05-27 DIAGNOSIS — Z79899 Other long term (current) drug therapy: Secondary | ICD-10-CM | POA: Insufficient documentation

## 2019-05-27 LAB — CBC WITH DIFFERENTIAL/PLATELET
Abs Immature Granulocytes: 0.01 10*3/uL (ref 0.00–0.07)
Basophils Absolute: 0.1 10*3/uL (ref 0.0–0.1)
Basophils Relative: 1 %
Eosinophils Absolute: 0.4 10*3/uL (ref 0.0–0.5)
Eosinophils Relative: 5 %
HCT: 48.8 % (ref 39.0–52.0)
Hemoglobin: 15.9 g/dL (ref 13.0–17.0)
Immature Granulocytes: 0 %
Lymphocytes Relative: 35 %
Lymphs Abs: 2.7 10*3/uL (ref 0.7–4.0)
MCH: 30.2 pg (ref 26.0–34.0)
MCHC: 32.6 g/dL (ref 30.0–36.0)
MCV: 92.8 fL (ref 80.0–100.0)
Monocytes Absolute: 0.8 10*3/uL (ref 0.1–1.0)
Monocytes Relative: 11 %
Neutro Abs: 3.7 10*3/uL (ref 1.7–7.7)
Neutrophils Relative %: 48 %
Platelets: 197 10*3/uL (ref 150–400)
RBC: 5.26 MIL/uL (ref 4.22–5.81)
RDW: 14.2 % (ref 11.5–15.5)
WBC: 7.7 10*3/uL (ref 4.0–10.5)
nRBC: 0 % (ref 0.0–0.2)

## 2019-05-27 LAB — URINALYSIS, ROUTINE W REFLEX MICROSCOPIC
Bilirubin Urine: NEGATIVE
Glucose, UA: NEGATIVE mg/dL
Hgb urine dipstick: NEGATIVE
Ketones, ur: NEGATIVE mg/dL
Leukocytes,Ua: NEGATIVE
Nitrite: NEGATIVE
Protein, ur: NEGATIVE mg/dL
Specific Gravity, Urine: 1.021 (ref 1.005–1.030)
pH: 6 (ref 5.0–8.0)

## 2019-05-27 LAB — COMPREHENSIVE METABOLIC PANEL
ALT: 50 U/L — ABNORMAL HIGH (ref 0–44)
AST: 36 U/L (ref 15–41)
Albumin: 4.4 g/dL (ref 3.5–5.0)
Alkaline Phosphatase: 68 U/L (ref 38–126)
Anion gap: 7 (ref 5–15)
BUN: 22 mg/dL — ABNORMAL HIGH (ref 6–20)
CO2: 25 mmol/L (ref 22–32)
Calcium: 8.7 mg/dL — ABNORMAL LOW (ref 8.9–10.3)
Chloride: 108 mmol/L (ref 98–111)
Creatinine, Ser: 0.97 mg/dL (ref 0.61–1.24)
GFR calc Af Amer: >60 mL/min (ref >60)
GFR calc non Af Amer: >60 mL/min (ref >60)
Glucose, Bld: 103 mg/dL — ABNORMAL HIGH (ref 70–99)
Potassium: 4.1 mmol/L (ref 3.5–5.1)
Sodium: 140 mmol/L (ref 135–145)
Total Bilirubin: 0.6 mg/dL (ref 0.3–1.2)
Total Protein: 7.3 g/dL (ref 6.5–8.1)

## 2019-05-27 MED ORDER — LIDOCAINE 5 % EX PTCH
1.0000 | MEDICATED_PATCH | CUTANEOUS | Status: DC
  Administered 2019-05-27: 09:00:00 1 via TRANSDERMAL
  Filled 2019-05-27: qty 1

## 2019-05-27 MED ORDER — CYCLOBENZAPRINE HCL 10 MG PO TABS: 10.0000 mg | ORAL_TABLET | Freq: Two times a day (BID) | ORAL | 0 refills | Status: DC | PRN

## 2019-05-27 NOTE — ED Triage Notes (Signed)
Patient here from home with complaints of neck swelling to rear radiating around on left side. States "my artery is swelling also". Also complains of left leg pain and swelling and states "it itches".

## 2019-05-27 NOTE — ED Provider Notes (Signed)
[facility] DEPT Provider Note   CSN: [id number] Arrival date & time: 05/27/19  0310     History   Chief Complaint Chief Complaint  Patient presents with  . Neck Pain  . Leg Pain  . Leg Swelling    HPI Jacob Cannon is a 48 y.o. male.     48 year old male presents with complaint of pain in the left side of his neck which radiates to his left side scalp.  Symptoms started approximately 1 week ago, progressively worsening, not improving with warm compresses.  Pain is worse with movement and with palpation.  Also reports pain in the area of the left SCM.  No injuries.  Patient is taking pain medications for shingles currently.  No other complaints or concerns.     Past Medical History:  Diagnosis Date  . Arthritis    type?  . Cardiomegaly 2010   [facility]  . Chest pain Chronic  . GERD (gastroesophageal reflux disease)   . Heart murmur congenital  . Hepatic steatosis   . Hyperlipidemia   . Hypertension   . OSA on CPAP 2010  . Psoriasis    used to see derm   . Sleep apnea     Patient Active Problem List   Diagnosis Date Noted  . Nonspecific abnormal electrocardiogram (ECG) (EKG)   . Hypertriglyceridemia 02/10/2019  . Prediabetes 02/10/2019  . Chest pain 02/09/2019  . Angina pectoris ([place]) 07/22/2018  . Gastroesophageal reflux disease without esophagitis 12/13/2017  . History of pancreatitis 12/13/2017  . Acute pancreatitis 03/15/2016  . 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 Unspecified gastritis and gastroduodenitis without mention of hemorrhage 09/13/2011  . Abdominal pain, right upper quadrant 09/12/2011  . Chest pain 04/20/2011  . Cough 01/15/2011  . General medical examination 01/12/2011  . Essential hypertension   . Psoriasis   . Heart murmur   . OSA on CPAP   . Cardiomegaly     Past Surgical History:  Procedure Laterality Date  . CARDIAC CATHETERIZATION    . ESOPHAGOGASTRODUODENOSCOPY  09/13/2011   Procedure:  ESOPHAGOGASTRODUODENOSCOPY (EGD);  Surgeon: Estanislado Emms., MD,FACG;  Location: [facility];  Service: Endoscopy;  Laterality: N/A;  . INGUINAL HERNIA REPAIR Right   . LEFT HEART CATH AND CORONARY ANGIOGRAPHY N/A 07/29/2018   Procedure: LEFT HEART CATH AND CORONARY ANGIOGRAPHY;  Surgeon: Belva Crome, MD;  Location: [facility];  Service: Cardiovascular;  Laterality: N/A;        Home Medications    Prior to Admission medications   Medication Sig Start Date End Date Taking? Authorizing Provider  albuterol (VENTOLIN HFA) 108 (90 Base) MCG/ACT inhaler Inhale 2 puffs into the lungs every 6 (six) hours as needed for wheezing or shortness of breath.  11/18/18  Yes [provider]  atorvastatin (LIPITOR) 80 MG tablet Take 1 tablet (80 mg total) by mouth at bedtime. 02/12/19  Yes Rai, Ripudeep K, MD  metoprolol succinate (TOPROL XL) 50 MG 24 hr tablet Take 1 tablet (50 mg total) by mouth daily. Take with or immediately following a meal. 04/24/19  Yes Revankar, Reita Cliche, MD  nitroGLYCERIN (NITROSTAT) 0.4 MG SL tablet Place 0.4 mg under the tongue every 5 (five) minutes as needed for chest pain.   Yes [provider]  olmesartan (BENICAR) 40 MG tablet Take 40 mg by mouth daily.   Yes [provider]  polyethylene glycol powder (GLYCOLAX/MIRALAX) powder Take 17 g by mouth daily as needed for moderate constipation.  Yes [provider]  pregabalin (LYRICA) 150 MG capsule Take 150 mg by mouth 2 (two) times daily.   Yes [provider]  senna (SENOKOT) 8.6 MG tablet Take 1 tablet by mouth daily as needed for constipation.    Yes [provider]  traMADol (ULTRAM) 50 MG tablet Take 50 mg by mouth every 8 (eight) hours as needed for moderate pain.   Yes [provider]  triamcinolone cream (KENALOG) 0.5 % APPLY TO AFFECTED AREA TWICE A DAY 10/22/16  Yes Jeffery, Chelle, PA  valACYclovir (VALTREX) 1000 MG tablet Take 1,000 mg by mouth  daily.   Yes [provider]  cyclobenzaprine (FLEXERIL) 10 MG tablet Take 1 tablet (10 mg total) by mouth 2 (two) times daily as needed for muscle spasms. 05/27/19   Tacy Learn, PA-C  lisinopril (ZESTRIL) 20 MG tablet Take 1 tablet (20 mg total) by mouth daily. NEED OV. Patient not taking: Reported on 05/27/2019 02/12/19   Rai, Vernelle Emerald, MD  pantoprazole (PROTONIX) 40 MG tablet Take 1 tablet (40 mg total) by mouth daily with breakfast. Patient not taking: Reported on 05/27/2019 04/06/16   Alfredia Ferguson, PA-C    Family History Family History  Problem Relation Age of Onset  . Multiple sclerosis Mother   . Diabetes Mother   . Heart attack Father   . Diabetes Brother   . Heart disease Maternal Grandmother   . Diabetes Maternal Uncle   . Colon cancer Neg Hx   . Prostate cancer Neg Hx     Social History Social History   Tobacco Use  . Smoking status: Former Smoker    Packs/day: 2.00    Types: Cigars    Quit date: 04/19/2001    Years since quitting: 18.1  . Smokeless tobacco: Never Used  Substance Use Topics  . Alcohol use: Yes    Comment: wine- occasionally  . Drug use: No     Allergies   Methocarbamol   Review of Systems Review of Systems  Constitutional: Negative for fever.  Eyes: Negative for visual disturbance.  Gastrointestinal: Negative for nausea and vomiting.  Musculoskeletal: Positive for neck pain. Negative for back pain and gait problem.  Skin: Negative for rash and wound.  Allergic/Immunologic: Negative for immunocompromised state.  Neurological: Positive for headaches. Negative for dizziness, weakness and numbness.  Psychiatric/Behavioral: Negative for confusion.  All other systems reviewed and are negative.    Physical Exam Updated Vital Signs BP (!) 142/91 (BP Location: Right Arm)   Pulse 62   Temp (!) 97.5 F (36.4 C) (Oral)   Resp 20   Ht 5\' 6"  (1.676 m)   Wt 95.3 kg   SpO2 100%   BMI 33.89 kg/m   Physical Exam Vitals  signs and nursing note reviewed.  Constitutional:      General: He is not in acute distress.    Appearance: He is well-developed. He is not diaphoretic.  HENT:     Head: Normocephalic and atraumatic.  Eyes:     Extraocular Movements: Extraocular movements intact.     Pupils: Pupils are equal, round, and reactive to light.  Neck:     Musculoskeletal: Muscular tenderness present. No neck rigidity.   Cardiovascular:     Pulses: Normal pulses.  Pulmonary:     Effort: Pulmonary effort is normal.  Musculoskeletal:        General: Tenderness present. No swelling, deformity or signs of injury.       Back:  Lymphadenopathy:  Cervical: No cervical adenopathy.  Skin:    General: Skin is warm and dry.     Findings: No erythema or rash.  Neurological:     General: No focal deficit present.     Mental Status: He is alert and oriented to person, place, and time.     Sensory: No sensory deficit.     Motor: No weakness.     Gait: Gait normal.  Psychiatric:        Behavior: Behavior normal.      ED Treatments / Results  Labs (all labs ordered are listed, but only abnormal results are displayed) Labs Reviewed  COMPREHENSIVE METABOLIC PANEL - Abnormal; Notable for the following components:      Result Value   Glucose, Bld 103 (*)    BUN 22 (*)    Calcium 8.7 (*)    ALT 50 (*)    All other components within normal limits  CBC WITH DIFFERENTIAL/PLATELET  URINALYSIS, ROUTINE W REFLEX MICROSCOPIC    EKG None  Radiology No results found.  Procedures Procedures (including critical care time)  Medications Ordered in ED Medications  lidocaine (LIDODERM) 5 % 1 patch (has no administration in time range)     Initial Impression / Assessment and Plan / ED Course  I have reviewed the triage vital signs and the nursing notes.  Pertinent labs & imaging results that were available during my care of the patient were reviewed by me and considered in my medical decision making (see  chart for details).  Clinical Course as of May 26 830  Wed May 26, 4264  342 48 year old male presents with complaint of left side neck pain without injury or fall, worse with palpation and movement.  On exam tenderness palpation left trapezius area as well as left SCM, no cervical adenopathy noted.  Patient is allergic to Robaxin, will prescribe Flexeril although advised not to take the Flexeril if he is taking Ultram.  He may also apply warm compresses, do gentle stretching and follow-up with his PCP for reevaluation and possible physical therapy referral.  Lab work ordered through triage including CBC and CMP without significant findings, urinalysis normal.   [LM]    Clinical Course User Index [LM] Tacy Learn, PA-C      Final Clinical Impressions(s) / ED Diagnoses   Final diagnoses:  Muscle spasm    ED Discharge Orders         Ordered    cyclobenzaprine (FLEXERIL) 10 MG tablet  2 times daily PRN     05/27/19 0826           Tacy Learn, PA-C 05/27/19 0831    Lennice Sites, DO 05/27/19 1231

## 2019-05-27 NOTE — Discharge Instructions (Signed)
Gentle stretching. Continue with your pain medications as prescribed. May take Tylenol. Take Flexeril as prescribed. Do not take Flexeril if you are taking Ultram.

## 2019-05-29 ENCOUNTER — Ambulatory Visit: Payer: BC Managed Care – PPO | Admitting: Family

## 2019-05-29 NOTE — Progress Notes (Deleted)
Office Visit    Patient Name: Jacob Cannon Date of Encounter: 05/29/2019  Primary Care Provider:  Bernerd Limbo, MD Primary Cardiologist:  Jenean Lindau, MD Electrophysiologist:  None   Chief Complaint    Jacob Cannon is a 48 y.o. male with a hx of HTN, OSA, diastolic dysfunction, concern for variant of HCM, DLD, postherpetic neuralgia, GERD presents today for follow up of HTN after medication changes.    Past Medical History    Past Medical History:  Diagnosis Date  . Arthritis    type?  . Cardiomegaly 2010   [facility]  . Chest pain Chronic  . GERD (gastroesophageal reflux disease)   . Heart murmur congenital  . Hepatic steatosis   . Hyperlipidemia   . Hypertension   . OSA on CPAP 2010  . Psoriasis    used to see derm   . Sleep apnea    Past Surgical History:  Procedure Laterality Date  . CARDIAC CATHETERIZATION    . ESOPHAGOGASTRODUODENOSCOPY  09/13/2011   Procedure: ESOPHAGOGASTRODUODENOSCOPY (EGD);  Surgeon: Estanislado Emms., MD,FACG;  Location: [facility];  Service: Endoscopy;  Laterality: N/A;  . INGUINAL HERNIA REPAIR Right   . LEFT HEART CATH AND CORONARY ANGIOGRAPHY N/A 07/29/2018   Procedure: LEFT HEART CATH AND CORONARY ANGIOGRAPHY;  Surgeon: Belva Crome, MD;  Location: [facility];  Service: Cardiovascular;  Laterality: N/A;    Allergies  Allergies  Allergen Reactions  . Methocarbamol     Chest pain    History of Present Illness    Jacob Cannon is a 48 y.o. male with a hx of HTN, concern for HCM, OSA, DLD, postherpetic neuralgia, GERD last seen ***.  Cath 06/2018 normal coronary arteries, concern possible microvascular angina. Admitted 02/09/19-02/12/19 to [facility] for atypical chest pain. Workup including troponin, d-dimer negative. Echo with EF>65%, severe increased LV wall thickness, gr1 DD. Cardiac MRI with some apical hypertrophy and questionable late gadolinium enhancement in the septum, concern for possible  HOCM variant. Treatment with beta blocker, Cardizem recommended. Referral to genetics clinic and sleep study were ordered.   EKGs/Labs/Other Studies Reviewed:   The following studies were reviewed today:  LHC 07/29/2018 LEFT HEART CATH AND CORONARY ANGIOGRAPHY  Conclusion    Left dominant coronary anatomy.  Normal short left main.  Normal large LAD which wraps around the left ventricular apex.  Large dominant circumflex which gives the PDA.  The entire circumflex system is normal.  Small nondominant right coronary.  Hyperdynamic left ventricle with EF greater than 70%.  Elevated LVEDP raises question of left ventricular hypertrophy/hypertrophic myocardial process.  Hemodynamics are consistent with a degree of diastolic dysfunction/chronic diastolic heart failure.   RECOMMENDATIONS:    2D Doppler echocardiogram to assess for evidence of ventricular hypertrophy.  Clinical diagnosis may be INOCA (ischemia with no obstructive coronary artery disease).  This could be on the basis of supply demand mismatch from microvascular disease.  Aggressive risk factor management including blood pressure less than 130/80, lipid-lowering, management of sleep apnea, and weight loss.    Left Heart   Left Ventricle The left ventricular size is normal. There is hyperdynamic left ventricular systolic function. LV end diastolic pressure is severely elevated. The left ventricular ejection fraction is greater than 65% by visual estimate. No regional wall motion abnormalities.    Echocardiogram 02/10/2019: IMPRESSIONS   1. The left ventricle has hyperdynamic systolic function, with an ejection fraction of >65%. The cavity size was normal. There  is severely increased left ventricular wall thickness. Left ventricular diastolic Doppler parameters are consistent with  impaired relaxation. Indeterminate filling pressures The E/e' is 8-15. No evidence of left ventricular regional wall motion abnormalities.  2.  The right ventricle has normal systolic function. The cavity was normal. There is no increase in right ventricular wall thickness.  3. The mitral valve is abnormal. Mild thickening of the mitral valve leaflet.  4. The tricuspid valve is grossly normal.  5. The aortic valve was not well visualized. No stenosis of the aortic valve.   SUMMARY   LVEF 65-70%, severe LVH (consider HCM or infiltrative cardiomyopathy), normal wall motion, grade 1 DD, indeterminate LV filling pressure, normal RV systolic function, normal biatrial size, normal IVC   Cardiac MRI 02/11/2019:   IMPRESSION: 1. Normal LV size with mild concentric LVH, vigorous systolic function with EF 72%. As above, possibly more hypertrophy towards the apex but not classic for apical hypertrophic cardiomyopathy. It is possible that this is a hypertensive cardiomyopathy.   2.  Normal RV size and systolic function, EF 0000000.   3. Difficult LGE images, possible mid-wall LGE in the septum, but cannot rule out this being from prominent trabeculation on the RV size of the septum. Mid-wall LGE would be more in keeping with hypertrophic cardiomyopathy.   Difficult study, cannot rule out HCM variant but not definitive.  EKG:  No EKG today.  Recent Labs: 02/09/2019: B Natriuretic Peptide 24.3 02/11/2019: Magnesium 2.2 05/27/2019: ALT 50; BUN 22; Creatinine, Ser 0.97; Hemoglobin 15.9; Platelets 197; Potassium 4.1; Sodium 140  Recent Lipid Panel    Component Value Date/Time   CHOL 194 02/10/2019 0452   TRIG 380 (H) 02/10/2019 0452   HDL 34 (L) 02/10/2019 0452   CHOLHDL 5.7 02/10/2019 0452   VLDL 76 (H) 02/10/2019 0452   LDLCALC 84 02/10/2019 0452   LDLDIRECT 150.9 01/12/2011 1002    Home Medications   No outpatient medications have been marked as taking for the 05/29/19 encounter (Appointment) with Loel Dubonnet, NP.      Review of Systems    ***   ROS All other systems reviewed and are otherwise negative except as noted  above.  Physical Exam    VS:  There were no vitals taken for this visit. , BMI There is no height or weight on file to calculate BMI. GEN: Well nourished, well developed, in no acute distress. HEENT: normal. Neck: Supple, no JVD, carotid bruits, or masses. Cardiac: ***RRR, no murmurs, rubs, or gallops. No clubbing, cyanosis, edema.  ***Radials/DP/PT 2+ and equal bilaterally.  Respiratory:  ***Respirations regular and unlabored, clear to auscultation bilaterally. GI: Soft, nontender, nondistended, BS + x 4. MS: No deformity or atrophy. Skin: Warm and dry, no rash. Neuro:  Strength and sensation are intact. Psych: Normal affect.  Assessment & Plan    1. 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 Possible microvascular angina -  3. Chronic diastolic HF -  4. HTN -  5. OSA -   Disposition: Follow up {follow up:15908} with ***   Loel Dubonnet, NP 05/29/2019, 1:09 PM

## 2019-07-02 ENCOUNTER — Telehealth: Payer: Self-pay

## 2019-07-02 DIAGNOSIS — I517 Cardiomegaly: Secondary | ICD-10-CM

## 2019-07-02 NOTE — Telephone Encounter (Signed)
-----   Message from Jenean Lindau, MD sent at 07/02/2019 11:45 AM EST ----- Regarding: RE: Genetic test result That is a great idea. Let us also refer him to Dr Curt Bears our EP doc for this ----- Message ----- From: Consuelo Pandy, PA-C Sent: 07/02/2019  11:29 AM EST To: Jenean Lindau, MD Subject: FW: Genetic test result                        FYI  ----- Message ----- From: Debbe Mounts, PhD Sent: 06/30/2019  11:56 AM EST To: Consuelo Pandy, PA-C, Maryjane Hurter Subject: Genetic test result                            Hi Brittainy,  Chambers does not have a mutation in the genes implictead in HCM. Considering that he has cardiac wall thickness indicativ eof HCM, but is genotype negative, he is a good candidate for the Cardiomyopathy Research study at [facility]. I will talk to him about his test result and will encourage him to enroll in this study. At this point, it would be prudent for his three kids to have regular surveillance for HCM.  Altha Harm, can you please schedule a 30 minute visit with me for the next genetics clinic.  Thanks [facility]

## 2019-07-02 NOTE — Telephone Encounter (Signed)
Patient in agreement with seeing Dr. Curt Bears. Referral placed and message routed to L. Welch to schedule.

## 2019-08-16 NOTE — Progress Notes (Deleted)
Electrophysiology Office Note   Date:  08/16/2019   ID:  Jacob Cannon, DOB 1970/12/05, MRN [id number]  PCP:  Bernerd Limbo, MD  Cardiologist:  Revankar Primary Electrophysiologist:  Kanijah Groseclose Meredith Leeds, MD    Chief Complaint: ***   History of Present Illness: Jacob Cannon is a 49 y.o. male who is being seen today for the evaluation of *** at the request of Revankar, Reita Cliche, MD. Presenting today for electrophysiology evaluation.  He has hypertension, hyperlipidemia, GERD, OSA, obesity, fatty liver. He has a family history of heart disease. He had a normal cardiac catheterization in 2019. He had a cardiac MRI for severe LVH that showed possible hypertrophic cardiomyopathy.  Today, he denies*** symptoms of palpitations, chest pain, shortness of breath, orthopnea, PND, lower extremity edema, claudication, dizziness, presyncope, syncope, bleeding, or neurologic sequela. The patient is tolerating medications without difficulties.    Past Medical History:  Diagnosis Date  . Arthritis    type?  . Cardiomegaly 2010   [facility]  . Chest pain Chronic  . GERD (gastroesophageal reflux disease)   . Heart murmur congenital  . Hepatic steatosis   . Hyperlipidemia   . Hypertension   . OSA on CPAP 2010  . Psoriasis    used to see derm   . Sleep apnea    Past Surgical History:  Procedure Laterality Date  . CARDIAC CATHETERIZATION    . ESOPHAGOGASTRODUODENOSCOPY  09/13/2011   Procedure: ESOPHAGOGASTRODUODENOSCOPY (EGD);  Surgeon: Estanislado Emms., MD,FACG;  Location: [facility];  Service: Endoscopy;  Laterality: N/A;  . INGUINAL HERNIA REPAIR Right   . LEFT HEART CATH AND CORONARY ANGIOGRAPHY N/A 07/29/2018   Procedure: LEFT HEART CATH AND CORONARY ANGIOGRAPHY;  Surgeon: Belva Crome, MD;  Location: [facility];  Service: Cardiovascular;  Laterality: N/A;     Current Outpatient Medications  Medication Sig Dispense Refill  . albuterol (VENTOLIN HFA) 108  (90 Base) MCG/ACT inhaler Inhale 2 puffs into the lungs every 6 (six) hours as needed for wheezing or shortness of breath.     Marland Kitchen atorvastatin (LIPITOR) 80 MG tablet Take 1 tablet (80 mg total) by mouth at bedtime. 30 tablet 4  . cyclobenzaprine (FLEXERIL) 10 MG tablet Take 1 tablet (10 mg total) by mouth 2 (two) times daily as needed for muscle spasms. 12 tablet 0  . lisinopril (ZESTRIL) 20 MG tablet Take 1 tablet (20 mg total) by mouth daily. NEED OV. (Patient not taking: Reported on 05/27/2019) 30 tablet 3  . metoprolol succinate (TOPROL XL) 50 MG 24 hr tablet Take 1 tablet (50 mg total) by mouth daily. Take with or immediately following a meal. 30 tablet 4  . nitroGLYCERIN (NITROSTAT) 0.4 MG SL tablet Place 0.4 mg under the tongue every 5 (five) minutes as needed for chest pain.    Marland Kitchen olmesartan (BENICAR) 40 MG tablet Take 40 mg by mouth daily.    . pantoprazole (PROTONIX) 40 MG tablet Take 1 tablet (40 mg total) by mouth daily with breakfast. (Patient not taking: Reported on 05/27/2019) 30 tablet 1  . polyethylene glycol powder (GLYCOLAX/MIRALAX) powder Take 17 g by mouth daily as needed for moderate constipation.     . pregabalin (LYRICA) 150 MG capsule Take 150 mg by mouth 2 (two) times daily.    Marland Kitchen senna (SENOKOT) 8.6 MG tablet Take 1 tablet by mouth daily as needed for constipation.     . traMADol (ULTRAM) 50 MG tablet Take 50 mg by mouth every  8 (eight) hours as needed for moderate pain.    Marland Kitchen triamcinolone cream (KENALOG) 0.5 % APPLY TO AFFECTED AREA TWICE A DAY 30 g 0  . valACYclovir (VALTREX) 1000 MG tablet Take 1,000 mg by mouth daily.     No current facility-administered medications for this visit.    Allergies:   Methocarbamol   Social History:  The patient  reports that he quit smoking about 18 years ago. His smoking use included cigars. He smoked 2.00 packs per day. He has never used smokeless tobacco. He reports current alcohol use. He reports that he does not use drugs.   Family  History:  The patient's family history includes Diabetes in his brother, maternal uncle, and mother; Heart attack in his father; Heart disease in his maternal grandmother; Multiple sclerosis in his mother.    ROS:  Please see the history of present illness.   Otherwise, review of systems is positive for none.   All other systems are reviewed and negative.    PHYSICAL EXAM: VS:  There were no vitals taken for this visit. , BMI There is no height or weight on file to calculate BMI. GEN: Well nourished, well developed, in no acute distress  HEENT: normal  Neck: no JVD, carotid bruits, or masses Cardiac: ***RRR; no murmurs, rubs, or gallops,no edema  Respiratory:  clear to auscultation bilaterally, normal work of breathing GI: soft, nontender, nondistended, + BS MS: no deformity or atrophy  Skin: warm and dry Neuro:  Strength and sensation are intact Psych: euthymic mood, full affect  EKG:  EKG {ACTION; IS/IS [id number] ordered today. Personal review of the ekg ordered *** shows ***  Recent Labs: 02/09/2019: B Natriuretic Peptide 24.3 02/11/2019: Magnesium 2.2 05/27/2019: ALT 50; BUN 22; Creatinine, Ser 0.97; Hemoglobin 15.9; Platelets 197; Potassium 4.1; Sodium 140    Lipid Panel     Component Value Date/Time   CHOL 194 02/10/2019 0452   TRIG 380 (H) 02/10/2019 0452   HDL 34 (L) 02/10/2019 0452   CHOLHDL 5.7 02/10/2019 0452   VLDL 76 (H) 02/10/2019 0452   LDLCALC 84 02/10/2019 0452   LDLDIRECT 150.9 01/12/2011 1002     Wt Readings from Last 3 Encounters:  05/27/19 210 lb (95.3 kg)  04/24/19 215 lb (97.5 kg)  02/19/19 212 lb 6.4 oz (96.3 kg)      Other studies Reviewed: Additional studies/ records that were reviewed today include: TTE 02/10/19  Review of the above records today demonstrates:   1. The left ventricle has hyperdynamic systolic function, with an ejection fraction of >65%. The cavity size was normal. There is severely increased left ventricular wall  thickness. Left ventricular diastolic Doppler parameters are consistent with  impaired relaxation. Indeterminate filling pressures The E/e' is 8-15. No evidence of left ventricular regional wall motion abnormalities.  2. The right ventricle has normal systolic function. The cavity was normal. There is no increase in right ventricular wall thickness.  3. The mitral valve is abnormal. Mild thickening of the mitral valve leaflet.  4. The tricuspid valve is grossly normal.  5. The aortic valve was not well visualized. No stenosis of the aortic valve.  LHC 07/29/18  Left dominant coronary anatomy.  Normal short left main.  Normal large LAD which wraps around the left ventricular apex.  Large dominant circumflex which gives the PDA.  The entire circumflex system is normal.  Small nondominant right coronary.  Hyperdynamic left ventricle with EF greater than 70%.  Elevated LVEDP raises question of left  ventricular hypertrophy/hypertrophic myocardial process.  Hemodynamics are consistent with a degree of diastolic dysfunction/chronic diastolic heart failure.  CMRI 1. Normal LV size with mild concentric LVH, vigorous systolic function with EF 72%. As above, possibly more hypertrophy towards the apex but not classic for apical hypertrophic cardiomyopathy. It is possible that this is a hypertensive cardiomyopathy.  2.  Normal RV size and systolic function, EF 0000000.  3. Difficult LGE images, possible mid-wall LGE in the septum, but cannot rule out this being from prominent trabeculation on the RV size of the septum. Mid-wall LGE would be more in keeping with hypertrophic cardiomyopathy.  ASSESSMENT AND PLAN:  1. Possible hypertrophic cardiomyopathy: As read on cardiac MRI. At this point, he does not have any high risk features. Despite that, we Ferris Tally place a 3-day monitor to determine if he has any ventricular arrhythmias.  2. Hypertension:***    Current medicines are reviewed at length  with the patient today.   The patient {ACTIONS; HAS/DOES NOT HAVE:19233} concerns regarding his medicines.  The following changes were made today:  {NONE DEFAULTED:18576::"none"}  Labs/ tests ordered today include: *** No orders of the defined types were placed in this encounter.    Disposition:   FU with Kirke Breach {gen number [id number] {Days to years:10300}  Signed, Altha Sweitzer Meredith Leeds, MD  08/16/2019 4:33 PM     [address] [place] [address] [phone] (office) [phone] (fax)

## 2019-08-17 ENCOUNTER — Institutional Professional Consult (permissible substitution): Payer: BC Managed Care – PPO | Admitting: Cardiology

## 2019-09-29 ENCOUNTER — Other Ambulatory Visit: Payer: Self-pay | Admitting: Cardiology

## 2020-03-15 ENCOUNTER — Telehealth: Payer: Self-pay | Admitting: Oncology

## 2020-03-15 ENCOUNTER — Other Ambulatory Visit: Payer: Self-pay | Admitting: Oncology

## 2020-03-15 DIAGNOSIS — U071 COVID-19: Secondary | ICD-10-CM

## 2020-03-15 NOTE — Telephone Encounter (Signed)
I connected by phone with  Jacob Cannon on 03/15/20 at 1pm to discuss the potential use of an new treatment for mild to moderate COVID-19 viral infection in non-hospitalized patients.   This patient is a age/sex that meets the FDA criteria for Emergency Use Authorization of casirivimab\imdevimab.  Has a (+) direct SARS-CoV-2 viral test result 1. Has mild or moderate COVID-19  2. Is ? 49 years of age and weighs ? 40 kg 3. Is NOT hospitalized due to COVID-19 4. Is NOT requiring oxygen therapy or requiring an increase in baseline oxygen flow rate due to COVID-19 5. Is within 10 days of symptom onset 6. Has at least one of the high risk factor(s) for progression to severe COVID-19 and/or hospitalization as defined in EUA. ? Specific high risk criteria :Obesity   Symptom onset  03/07/20.    I have spoken and communicated the following to the patient or parent/caregiver:   1. FDA has authorized the emergency use of casirivimab\imdevimab for the treatment of mild to moderate COVID-19 in adults and pediatric patients with positive results of direct SARS-CoV-2 viral testing who are 35 years of age and older weighing at least 40 kg, and who are at high risk for progressing to severe COVID-19 and/or hospitalization.   2. The significant known and potential risks and benefits of casirivimab\imdevimab, and the extent to which such potential risks and benefits are unknown.   3. Information on available alternative treatments and the risks and benefits of those alternatives, including clinical trials.   4. Patients treated with casirivimab\imdevimab should continue to self-isolate and use infection control measures (e.g., wear mask, isolate, social distance, avoid sharing personal items, clean and disinfect "high touch" surfaces, and frequent handwashing) according to CDC guidelines.    5. The patient or parent/caregiver has the option to accept or refuse casirivimab\imdevimab .   After reviewing this  information with the patient, The patient agreed to proceed with receiving casirivimab\imdevimab infusion and will be provided a copy of the Fact sheet prior to receiving the infusion.Rulon Abide, AGNP-C [phone] ([place])

## 2020-03-16 ENCOUNTER — Ambulatory Visit (HOSPITAL_COMMUNITY)
Admission: RE | Admit: 2020-03-16 | Discharge: 2020-03-16 | Disposition: A | Discharge status: Home / Self Care | Payer: BC Managed Care – PPO | Source: Ambulatory Visit | Attending: Pulmonary Disease | Admitting: Pulmonary Disease

## 2020-03-16 DIAGNOSIS — U071 COVID-19: Secondary | ICD-10-CM | POA: Diagnosis not present

## 2020-03-16 MED ORDER — FAMOTIDINE IN NACL 20-0.9 MG/50ML-% IV SOLN: 20.0000 mg | Freq: Once | INTRAVENOUS | Status: DC | PRN

## 2020-03-16 MED ORDER — EPINEPHRINE 0.3 MG/0.3ML IJ SOAJ: 0.3000 mg | Freq: Once | INTRAMUSCULAR | Status: DC | PRN

## 2020-03-16 MED ORDER — METHYLPREDNISOLONE SODIUM SUCC 125 MG IJ SOLR: 125.0000 mg | Freq: Once | INTRAMUSCULAR | Status: DC | PRN

## 2020-03-16 MED ORDER — DIPHENHYDRAMINE HCL 50 MG/ML IJ SOLN: 50.0000 mg | Freq: Once | INTRAMUSCULAR | Status: DC | PRN

## 2020-03-16 MED ORDER — SODIUM CHLORIDE 0.9 % IV SOLN: INTRAVENOUS | Status: DC | PRN

## 2020-03-16 MED ORDER — SODIUM CHLORIDE 0.9 % IV SOLN
1200.0000 mg | Freq: Once | INTRAVENOUS | Status: AC
  Administered 2020-03-16: 1200 mg via INTRAVENOUS
  Filled 2020-03-16: qty 10

## 2020-03-16 MED ORDER — ALBUTEROL SULFATE HFA 108 (90 BASE) MCG/ACT IN AERS: 2.0000 | INHALATION_SPRAY | Freq: Once | RESPIRATORY_TRACT | Status: DC | PRN

## 2020-03-16 NOTE — Progress Notes (Signed)
  Diagnosis: COVID-19  Physician: Dr Patrick Wright  Procedure: Cov[facility] Infusion Clinic Med: casirivimab\imdevimab infusion - Provided patient with casirivimab\imdevimab fact sheet for patients, parents and caregivers prior to infusion.  Complications: No immediate complications noted.  Discharge: Discharged home   America Sandall 03/16/2020   

## 2020-03-16 NOTE — Discharge Instructions (Signed)

## 2021-03-30 ENCOUNTER — Ambulatory Visit: Payer: BC Managed Care – PPO | Admitting: Cardiology

## 2021-04-21 ENCOUNTER — Encounter: Payer: Self-pay | Admitting: Cardiology

## 2021-04-21 ENCOUNTER — Ambulatory Visit: Payer: BC Managed Care – PPO | Admitting: Cardiology

## 2021-04-21 ENCOUNTER — Other Ambulatory Visit: Payer: Self-pay

## 2021-04-21 VITALS — BP 144/90 | HR 67 | Temp 98.0°F | Resp 16 | Ht 65.0 in | Wt 210.0 lb

## 2021-04-21 DIAGNOSIS — I10 Essential (primary) hypertension: Secondary | ICD-10-CM

## 2021-04-21 DIAGNOSIS — I517 Cardiomegaly: Secondary | ICD-10-CM

## 2021-04-21 DIAGNOSIS — R072 Precordial pain: Secondary | ICD-10-CM

## 2021-04-21 DIAGNOSIS — E782 Mixed hyperlipidemia: Secondary | ICD-10-CM

## 2021-04-21 DIAGNOSIS — R42 Dizziness and giddiness: Secondary | ICD-10-CM

## 2021-04-21 MED ORDER — ASPIRIN EC 81 MG PO TBEC: 81.0000 mg | DELAYED_RELEASE_TABLET | Freq: Every day | ORAL | 3 refills | Status: DC

## 2021-04-21 MED ORDER — EZETIMIBE 10 MG PO TABS: 10.0000 mg | ORAL_TABLET | Freq: Every day | ORAL | 3 refills | Status: AC

## 2021-04-21 MED ORDER — METOPROLOL SUCCINATE ER 100 MG PO TB24: 100.0000 mg | ORAL_TABLET | Freq: Every day | ORAL | 3 refills | Status: DC

## 2021-04-21 NOTE — Progress Notes (Signed)
Patient referred by Bernerd Limbo, MD for chest pain  Subjective:   Jacob Cannon, male    DOB: 08-18-1970, 50 y.o.   MRN: [id number]  Chief Complaint  Patient presents with   Chest Pain   Hypertension   New Patient (Initial Visit)     HPI  50 y.o. African-American male with chest pain  Patient is here with his wife today.  He works in [employer], with his work profile being physically demanding.  Patient has noticed retrosternal chest tightness with physical exertion.  He particularly notices this while push mowing his grass lawn at home.  Symptoms improved with rest.  In addition, he also reports episodes of flutter sensation lasting for 2-3 minutes.  Some of his chest pain and flutter episodes are associated with complaints of "fading away".  His wife reports that during these episodes, he is not responsive and does not recollect the exact sequence of events on regaining complete awareness.  He has not had any syncope, but has had presyncopal episodes.  Extensively reviewed previous cardiac testing with independent interpretation.  Patient has had similar chest pain for couple of years.  He underwent nuclear stress test in 2010, heart catheterization in 2019, echocardiogram and cardiac MRI in 2020.  He was noted to have severe LVH, predominantly at his apex.  While echocardiogram had raised the possibility of hypertrophic cardiomyopathy, MRI at that time was inconclusive.  Differential diagnosis at that time included hypertensive versus hypertrophic cardiomyopathy.  His blood pressure is in 140-150s/90s on metoprolol succinate 50 mg daily and olmesartan 40 mg daily.  Coronary angiography at that time showed left dominant circulation with no significant CAD.  It was presumed that his angina was microvascular in nature, related to supply demand mismatch in the setting of left ventricular hypertrophy.  On further history from the patient, it does appear that he has had sudden deaths  in the family.  Patient's father was noted to have some heart problem, details not known.  He died suddenly at home and is mid 52s.  Patient denies father having undergone any CABG or pacemaker placement etc.  Patient's paternal uncle had sudden death in his 47s, was not known to have any prior cardiac problems.  Patient's brother was hospitalized for some cardiac problem, details not known.  He was awaiting discharge is when he suddenly died at age 75.   Past Medical History:  Diagnosis Date   Arthritis    type?   Cardiomegaly 2010   [facility]   Chest pain Chronic   GERD (gastroesophageal reflux disease)    Heart murmur congenital   Hepatic steatosis    Hyperlipidemia    Hypertension    OSA on CPAP 2010   Psoriasis    used to see derm    Sleep apnea      Past Surgical History:  Procedure Laterality Date   CARDIAC CATHETERIZATION     ESOPHAGOGASTRODUODENOSCOPY  09/13/2011   Procedure: ESOPHAGOGASTRODUODENOSCOPY (EGD);  Surgeon: Estanislado Emms., MD,FACG;  Location: [facility];  Service: Endoscopy;  Laterality: N/A;   INGUINAL HERNIA REPAIR Right    LEFT HEART CATH AND CORONARY ANGIOGRAPHY N/A 07/29/2018   Procedure: LEFT HEART CATH AND CORONARY ANGIOGRAPHY;  Surgeon: Belva Crome, MD;  Location: [facility];  Service: Cardiovascular;  Laterality: N/A;     Social History   Tobacco Use  Smoking Status Former   Packs/day: 2.00   Types: Cigars, Cigarettes   Quit date:  04/19/2001   Years since quitting: 20.0  Smokeless Tobacco Never    Social History   Substance and Sexual Activity  Alcohol Use Yes   Comment: wine- occasionally     Family History  Problem Relation Age of Onset   Multiple sclerosis Mother    Diabetes Mother    Heart attack Father    Diabetes Brother    Heart disease Maternal Grandmother    Diabetes Maternal Uncle    Colon cancer Neg Hx    Prostate cancer Neg Hx      Current Outpatient Medications on File Prior to Visit   Medication Sig Dispense Refill   albuterol (VENTOLIN HFA) 108 (90 Base) MCG/ACT inhaler Inhale 2 puffs into the lungs every 6 (six) hours as needed for wheezing or shortness of breath.      atorvastatin (LIPITOR) 80 MG tablet Take 1 tablet (80 mg total) by mouth at bedtime. 30 tablet 4   cyclobenzaprine (FLEXERIL) 10 MG tablet Take 1 tablet (10 mg total) by mouth 2 (two) times daily as needed for muscle spasms. 12 tablet 0   fluticasone furoate-vilanterol (BREO ELLIPTA) 200-25 MCG/INH AEPB Inhale into the lungs.     metoprolol succinate (TOPROL-XL) 50 MG 24 hr tablet TAKE 1 TABLET BY MOUTH EVERY DAY, TAKE WITH OR IMMEDIATELY FOLLOWING A MEAL 90 tablet 1   nitroGLYCERIN (NITROSTAT) 0.4 MG SL tablet Place 0.4 mg under the tongue every 5 (five) minutes as needed for chest pain.     olmesartan (BENICAR) 40 MG tablet Take 40 mg by mouth daily.     pantoprazole (PROTONIX) 40 MG tablet Take 1 tablet (40 mg total) by mouth daily with breakfast. 30 tablet 1   polyethylene glycol powder (GLYCOLAX/MIRALAX) powder Take 17 g by mouth daily as needed for moderate constipation.      triamcinolone cream (KENALOG) 0.5 % APPLY TO AFFECTED AREA TWICE A DAY 30 g 0   valACYclovir (VALTREX) 1000 MG tablet Take 1,000 mg by mouth daily.     No current facility-administered medications on file prior to visit.    Cardiovascular and other pertinent studies:  EKG 04/21/2021: Sinus rhythm 68 npm  RSR(V1) -nondiagnostic  Extensive T-abnormality, unchanged compared to previous EKG in 2020.  Echocardiogram 01/2019:  1. The left ventricle has hyperdynamic systolic function, with an  ejection fraction of >65%. The cavity size was normal. There is severely  increased left ventricular wall thickness. Left ventricular diastolic  Doppler parameters are consistent with  impaired relaxation. Indeterminate filling pressures The E/e' is 8-15. No  evidence of left ventricular regional wall motion abnormalities.   2. The right  ventricle has normal systolic function. The cavity was  normal. There is no increase in right ventricular wall thickness.   3. The mitral valve is abnormal. Mild thickening of the mitral valve  leaflet.   4. The tricuspid valve is grossly normal.   5. The aortic valve was not well visualized. No stenosis of the aortic  valve.   SUMMARY    LVEF 65-70%, severe LVH (consider HCM or infiltrative  cardiomyopathy), normal wall motion, grade 1 DD, indeterminate LV  filling pressure, normal RV systolic function, normal biatrial size,  normal IVC   Cardiac MRI 01/2019: 1. Normal LV size with mild concentric LVH, vigorous systolic function with EF 72%. As above, possibly more hypertrophy towards the apex but not classic for apical hypertrophic cardiomyopathy. It is possible that this is a hypertensive cardiomyopathy. 2.  Normal RV size and systolic function,  EF 68%. 3. Difficult LGE images, possible mid-wall LGE in the septum, but cannot rule out this being from prominent trabeculation on the RV size of the septum. Mid-wall LGE would be more in keeping with hypertrophic cardiomyopathy.  Difficult study, cannot rule out HCM variant but not definitive.   Coronary angiography 06/2018: Left dominant coronary anatomy. Normal short left main. Normal large LAD which wraps around the left ventricular apex. Large dominant circumflex which gives the PDA.  The entire circumflex system is normal. Small nondominant right coronary. Hyperdynamic left ventricle with EF greater than 70%.  Elevated LVEDP raises question of left ventricular hypertrophy/hypertrophic myocardial process.  Hemodynamics are consistent with a degree of diastolic dysfunction/chronic diastolic heart failure.   RECOMMENDATIONS:   2D Doppler echocardiogram to assess for evidence of ventricular hypertrophy. Clinical diagnosis may be INOCA (ischemia with no obstructive coronary artery disease).  This could be on the basis of supply  demand mismatch from microvascular disease. Aggressive risk factor management including blood pressure less than 130/80, lipid-lowering, management of sleep apnea, and weight loss.     Recent labs: 03/15/2021: Glucose 88, BUN/Cr 15/1.29. EGFR 68. Na/K 139/4.0. Rest of the CMP normal H/H 15/47. MCV 88. Platelets 243 HbA1C N/A Chol 185, TG 127, HDL 44, LDL 115 TSH N/A BNP 7.9 normal    Review of Systems  Cardiovascular:  Positive for chest pain, dyspnea on exertion and palpitations. Negative for leg swelling and syncope.       Presyncope        Vitals:   04/21/21 1323 04/21/21 1331  BP: (!) 153/89 (!) 144/90  Pulse: 72 67  Resp: 16   Temp: 98 F (36.7 C)   SpO2: 96% 97%     Body mass index is 34.95 kg/m. Filed Weights   04/21/21 1323  Weight: 210 lb (95.3 kg)     Objective:   Physical Exam Vitals and nursing note reviewed.  Constitutional:      General: He is not in acute distress. Neck:     Vascular: No JVD.  Cardiovascular:     Rate and Rhythm: Normal rate and regular rhythm.     Heart sounds: Normal heart sounds. No murmur heard. Pulmonary:     Effort: Pulmonary effort is normal.     Breath sounds: Normal breath sounds. No wheezing or rales.  Musculoskeletal:     Right lower leg: No edema.     Left lower leg: No edema.     Assessment & Recommendations:   50 mg African-American male with hypertension, left ventricular hypertrophy, with worsening chest pain  Chest pain: Typical anginal symptoms.  Coronary angiography in 2019 with left dominant circulation and no obstructive CAD.  Supply demand mismatch is certainly a possibility given his left ventricular hypertrophy.  See below regarding LVH.  At this time, I have increased his metoprolol succinate to 100 mg daily.  Added aspirin 81 mg daily.  Given uncontrolled LDL, added Zetia 10 mg daily.  Continue Lipitor 80 mg daily.  Will obtain modified Bruce/Lexiscan stress testing.  Left ventricular  hypertrophy: Severe LVH, most prominent at apex.  No significant murmur on exam.  Differential includes hypertensive versus hypertrophic or infiltrative cardiomyopathy.  His hypertension is not malignant to expect to cause severe LVH.  He does have some unexplained and possible sudden deaths in his paternal side of family.  My suspicion for hypertrophic cardiomyopathy persists.  I recommend repeat cardiac MRI and genetic testing to evaluate for hypertrophic cardiomyopathy.  Given that he  does not have any LVOT obstruction, use of nitrates as needed should be reasonable, in case of chest pain.  Complex history, independent review of previous testing, with acute exacerbation of chronic problem of chest pain  Thank you for referring the patient to [place]. Please feel free to contact with any questions.   Nigel Mormon, MD Pager: [phone] Office: [phone]

## 2021-05-15 ENCOUNTER — Ambulatory Visit: Payer: BC Managed Care – PPO

## 2021-05-15 ENCOUNTER — Inpatient Hospital Stay: Payer: BC Managed Care – PPO

## 2021-05-15 ENCOUNTER — Encounter: Payer: Self-pay | Admitting: Student

## 2021-05-15 ENCOUNTER — Ambulatory Visit: Payer: BC Managed Care – PPO | Admitting: Student

## 2021-05-15 ENCOUNTER — Other Ambulatory Visit: Payer: Self-pay

## 2021-05-15 VITALS — BP 158/92 | HR 77 | Ht 65.0 in | Wt 210.0 lb

## 2021-05-15 DIAGNOSIS — I517 Cardiomegaly: Secondary | ICD-10-CM

## 2021-05-15 DIAGNOSIS — R072 Precordial pain: Secondary | ICD-10-CM

## 2021-05-15 DIAGNOSIS — R55 Syncope and collapse: Secondary | ICD-10-CM

## 2021-05-15 DIAGNOSIS — R42 Dizziness and giddiness: Secondary | ICD-10-CM

## 2021-05-15 NOTE — Progress Notes (Signed)
Jacob Cannon is a 50 y.o.African-American male with currently being evaluated by Dr. Virgina Jock.  Recent echocardiogram and cardiac MRI noted severe LVH concerning for hypertrophic cardiomyopathy.  Patient presents today to complete genetic testing for hypertrophic cardiomyopathy.  Reviewed and discussed with patient alternatives, risk, benefits of proceeding with genetic testing, patient verbalized understanding and agreement.  Patient wishes to proceed with genetic evaluation for hypertrophic cardiomyopathy.  Sample was collected and consent signed. Will defer further management to Dr. Virgina Jock.    Alethia Berthold, PA-C 05/15/2021, 12:56 PM Office: [phone]

## 2021-05-15 NOTE — Progress Notes (Deleted)
Patient referred by Bernerd Limbo, MD for chest pain  Subjective:   Jacob Cannon, male    DOB: 1971/07/20, 50 y.o.   MRN: [id number]  Chief Complaint  Patient presents with   Chest Pain   Follow-up   Results     HPI  50 y.o. African-American male with chest pain  Patient is here with his wife today.  He works in [employer], with his work profile being physically demanding.  Patient has noticed retrosternal chest tightness with physical exertion.  He particularly notices this while push mowing his grass lawn at home.  Symptoms improved with rest.  In addition, he also reports episodes of flutter sensation lasting for 2-3 minutes.  Some of his chest pain and flutter episodes are associated with complaints of "fading away".  His wife reports that during these episodes, he is not responsive and does not recollect the exact sequence of events on regaining complete awareness.  He has not had any syncope, but has had presyncopal episodes.  Extensively reviewed previous cardiac testing with independent interpretation.  Patient has had similar chest pain for couple of years.  He underwent nuclear stress test in 2010, heart catheterization in 2019, echocardiogram and cardiac MRI in 2020.  He was noted to have severe LVH, predominantly at his apex.  While echocardiogram had raised the possibility of hypertrophic cardiomyopathy, MRI at that time was inconclusive.  Differential diagnosis at that time included hypertensive versus hypertrophic cardiomyopathy.  His blood pressure is in 140-150s/90s on metoprolol succinate 50 mg daily and olmesartan 40 mg daily.  Coronary angiography at that time showed left dominant circulation with no significant CAD.  It was presumed that his angina was microvascular in nature, related to supply demand mismatch in the setting of left ventricular hypertrophy.  On further history from the patient, it does appear that he has had sudden deaths in the family.   Patient's father was noted to have some heart problem, details not known.  He died suddenly at home and is mid 43s.  Patient denies father having undergone any CABG or pacemaker placement etc.  Patient's paternal uncle had sudden death in his 43s, was not known to have any prior cardiac problems.  Patient's brother was hospitalized for some cardiac problem, details not known.  He was awaiting discharge is when he suddenly died at age 32.   Past Medical History:  Diagnosis Date   Arthritis    type?   Cardiomegaly 2010   [facility]   Chest pain Chronic   GERD (gastroesophageal reflux disease)    Heart murmur congenital   Hepatic steatosis    Hyperlipidemia    Hypertension    OSA on CPAP 2010   Psoriasis    used to see derm    Sleep apnea      Past Surgical History:  Procedure Laterality Date   CARDIAC CATHETERIZATION     ESOPHAGOGASTRODUODENOSCOPY  09/13/2011   Procedure: ESOPHAGOGASTRODUODENOSCOPY (EGD);  Surgeon: Estanislado Emms., MD,FACG;  Location: [facility];  Service: Endoscopy;  Laterality: N/A;   INGUINAL HERNIA REPAIR Right    LEFT HEART CATH AND CORONARY ANGIOGRAPHY N/A 07/29/2018   Procedure: LEFT HEART CATH AND CORONARY ANGIOGRAPHY;  Surgeon: Belva Crome, MD;  Location: [facility];  Service: Cardiovascular;  Laterality: N/A;     Social History   Tobacco Use  Smoking Status Former   Types: Cigars   Quit date: 2002   Years since quitting: 20.8  Smokeless Tobacco Never    Social History   Substance and Sexual Activity  Alcohol Use Yes   Comment: wine- occasionally     Family History  Problem Relation Age of Onset   Multiple sclerosis Mother    Diabetes Mother    Heart attack Father    Diabetes Sister    Heart disease Brother    Diabetes Brother    Heart disease Brother    Heart disease Brother    Heart disease Brother    Heart disease Brother    Heart disease Brother    Diabetes Maternal Uncle    Heart disease Maternal  Grandmother    Colon cancer Neg Hx    Prostate cancer Neg Hx      Current Outpatient Medications on File Prior to Visit  Medication Sig Dispense Refill   albuterol (VENTOLIN HFA) 108 (90 Base) MCG/ACT inhaler Inhale 2 puffs into the lungs every 6 (six) hours as needed for wheezing or shortness of breath.      aspirin EC 81 MG tablet Take 1 tablet (81 mg total) by mouth daily. Swallow whole. 30 tablet 3   atorvastatin (LIPITOR) 80 MG tablet Take 1 tablet (80 mg total) by mouth at bedtime. 30 tablet 4   cyclobenzaprine (FLEXERIL) 10 MG tablet Take 1 tablet (10 mg total) by mouth 2 (two) times daily as needed for muscle spasms. 12 tablet 0   ezetimibe (ZETIA) 10 MG tablet Take 1 tablet (10 mg total) by mouth daily. 30 tablet 3   fluticasone furoate-vilanterol (BREO ELLIPTA) 200-25 MCG/INH AEPB Inhale into the lungs.     metoprolol succinate (TOPROL-XL) 100 MG 24 hr tablet Take 1 tablet (100 mg total) by mouth daily. Take with or immediately following a meal. 90 tablet 3   nitroGLYCERIN (NITROSTAT) 0.4 MG SL tablet Place 0.4 mg under the tongue every 5 (five) minutes as needed for chest pain.     olmesartan (BENICAR) 40 MG tablet Take 40 mg by mouth daily.     pantoprazole (PROTONIX) 40 MG tablet Take 1 tablet (40 mg total) by mouth daily with breakfast. 30 tablet 1   polyethylene glycol powder (GLYCOLAX/MIRALAX) powder Take 17 g by mouth daily as needed for moderate constipation.      triamcinolone cream (KENALOG) 0.5 % APPLY TO AFFECTED AREA TWICE A DAY 30 g 0   valACYclovir (VALTREX) 1000 MG tablet Take 1,000 mg by mouth daily.     No current facility-administered medications on file prior to visit.    Cardiovascular and other pertinent studies:  EKG 04/21/2021: Sinus rhythm 68 npm  RSR(V1) -nondiagnostic  Extensive T-abnormality, unchanged compared to previous EKG in 2020.  Echocardiogram 01/2019:  1. The left ventricle has hyperdynamic systolic function, with an  ejection fraction of  >65%. The cavity size was normal. There is severely  increased left ventricular wall thickness. Left ventricular diastolic  Doppler parameters are consistent with  impaired relaxation. Indeterminate filling pressures The E/e' is 8-15. No  evidence of left ventricular regional wall motion abnormalities.   2. The right ventricle has normal systolic function. The cavity was  normal. There is no increase in right ventricular wall thickness.   3. The mitral valve is abnormal. Mild thickening of the mitral valve  leaflet.   4. The tricuspid valve is grossly normal.   5. The aortic valve was not well visualized. No stenosis of the aortic  valve.   SUMMARY    LVEF 65-70%, severe LVH (consider HCM or  infiltrative  cardiomyopathy), normal wall motion, grade 1 DD, indeterminate LV  filling pressure, normal RV systolic function, normal biatrial size,  normal IVC   Cardiac MRI 01/2019: 1. Normal LV size with mild concentric LVH, vigorous systolic function with EF 72%. As above, possibly more hypertrophy towards the apex but not classic for apical hypertrophic cardiomyopathy. It is possible that this is a hypertensive cardiomyopathy. 2.  Normal RV size and systolic function, EF 96%. 3. Difficult LGE images, possible mid-wall LGE in the septum, but cannot rule out this being from prominent trabeculation on the RV size of the septum. Mid-wall LGE would be more in keeping with hypertrophic cardiomyopathy.  Difficult study, cannot rule out HCM variant but not definitive.   Coronary angiography 06/2018: Left dominant coronary anatomy. Normal short left main. Normal large LAD which wraps around the left ventricular apex. Large dominant circumflex which gives the PDA.  The entire circumflex system is normal. Small nondominant right coronary. Hyperdynamic left ventricle with EF greater than 70%.  Elevated LVEDP raises question of left ventricular hypertrophy/hypertrophic myocardial process.   Hemodynamics are consistent with a degree of diastolic dysfunction/chronic diastolic heart failure.   RECOMMENDATIONS:   2D Doppler echocardiogram to assess for evidence of ventricular hypertrophy. Clinical diagnosis may be INOCA (ischemia with no obstructive coronary artery disease).  This could be on the basis of supply demand mismatch from microvascular disease. Aggressive risk factor management including blood pressure less than 130/80, lipid-lowering, management of sleep apnea, and weight loss.     Recent labs: 03/15/2021: Glucose 88, BUN/Cr 15/1.29. EGFR 68. Na/K 139/4.0. Rest of the CMP normal H/H 15/47. MCV 88. Platelets 243 HbA1C N/A Chol 185, TG 127, HDL 44, LDL 115 TSH N/A BNP 7.9 normal    Review of Systems  Cardiovascular:  Positive for chest pain, dyspnea on exertion and palpitations. Negative for leg swelling and syncope.       Presyncope        Vitals:   05/15/21 1150 05/15/21 1158  BP: (!) 151/93 (!) 158/92  Pulse: 87 77  SpO2: 96%      Body mass index is 34.95 kg/m. Filed Weights   05/15/21 1150  Weight: 210 lb (95.3 kg)     Objective:   Physical Exam Vitals and nursing note reviewed.  Constitutional:      General: He is not in acute distress. Neck:     Vascular: No JVD.  Cardiovascular:     Rate and Rhythm: Normal rate and regular rhythm.     Heart sounds: Normal heart sounds. No murmur heard. Pulmonary:     Effort: Pulmonary effort is normal.     Breath sounds: Normal breath sounds. No wheezing or rales.  Musculoskeletal:     Right lower leg: No edema.     Left lower leg: No edema.     Assessment & Recommendations:     ICD-10-CM   1. Left ventricular hypertrophy  I51.7       50 mg African-American male with hypertension, left ventricular hypertrophy, with worsening chest pain  Chest pain: Typical anginal symptoms.  Coronary angiography in 2019 with left dominant circulation and no obstructive CAD.  Supply demand mismatch is  certainly a possibility given his left ventricular hypertrophy.  See below regarding LVH.  At this time, I have increased his metoprolol succinate to 100 mg daily.  Added aspirin 81 mg daily.  Given uncontrolled LDL, added Zetia 10 mg daily.  Continue Lipitor 80 mg daily.  Will obtain modified  Bruce/Lexiscan stress testing.  Left ventricular hypertrophy: Severe LVH, most prominent at apex.  No significant murmur on exam.  Differential includes hypertensive versus hypertrophic or infiltrative cardiomyopathy.  His hypertension is not malignant to expect to cause severe LVH.  He does have some unexplained and possible sudden deaths in his paternal side of family.  My suspicion for hypertrophic cardiomyopathy persists.  I recommend repeat cardiac MRI and genetic testing to evaluate for hypertrophic cardiomyopathy.  Given that he does not have any LVOT obstruction, use of nitrates as needed should be reasonable, in case of chest pain.  Complex history, independent review of previous testing, with acute exacerbation of chronic problem of chest pain  Thank you for referring the patient to [place]. Please feel free to contact with any questions.   Nigel Mormon, MD Pager: [phone] Office: [phone]

## 2021-05-16 LAB — PCV MYOCARDIAL PERFUSION WITH LEXISCAN: ST Depression (mm): 0 mm

## 2021-05-26 ENCOUNTER — Ambulatory Visit: Payer: BC Managed Care – PPO | Admitting: Cardiology

## 2021-05-29 NOTE — Progress Notes (Signed)
No clinically significant genetic variants detected.  Seeing you 06/08/2021

## 2021-05-30 NOTE — Progress Notes (Signed)
Thank you :)

## 2021-06-08 ENCOUNTER — Other Ambulatory Visit: Payer: Self-pay

## 2021-06-08 ENCOUNTER — Encounter: Payer: Self-pay | Admitting: Cardiology

## 2021-06-08 ENCOUNTER — Ambulatory Visit: Payer: BC Managed Care – PPO | Admitting: Cardiology

## 2021-06-08 VITALS — BP 172/93 | HR 84 | Ht 65.0 in | Wt 207.0 lb

## 2021-06-08 DIAGNOSIS — R072 Precordial pain: Secondary | ICD-10-CM

## 2021-06-08 DIAGNOSIS — I10 Essential (primary) hypertension: Secondary | ICD-10-CM

## 2021-06-08 MED ORDER — AMLODIPINE BESYLATE 10 MG PO TABS: 10.0000 mg | ORAL_TABLET | Freq: Every day | ORAL | 3 refills | Status: DC

## 2021-06-08 MED ORDER — METOPROLOL SUCCINATE ER 100 MG PO TB24: 100.0000 mg | ORAL_TABLET | Freq: Every day | ORAL | 3 refills | Status: AC

## 2021-06-08 NOTE — Progress Notes (Signed)
Patient referred by Bernerd Limbo, MD for chest pain  Subjective:   Jacob Cannon, male    DOB: 05-20-1971, 50 y.o.   MRN: [id number]  Chief Complaint  Patient presents with   Chest Pain   Follow-up   Results     HPI  50 y.o. African-American male with chest pain  Recent echocardiogram and stress test reviewed with the patient, details below. Genetic testing did not show any abnormality to suggest HCM. He did not undergo cardiac MRI.   Initial consultation visit 04/2021: Patient is here with his wife today.  He works in [employer], with his work profile being physically demanding.  Patient has noticed retrosternal chest tightness with physical exertion.  He particularly notices this while push mowing his grass lawn at home.  Symptoms improved with rest.  In addition, he also reports episodes of flutter sensation lasting for 2-3 minutes.  Some of his chest pain and flutter episodes are associated with complaints of "fading away".  His wife reports that during these episodes, he is not responsive and does not recollect the exact sequence of events on regaining complete awareness.  He has not had any syncope, but has had presyncopal episodes.  Extensively reviewed previous cardiac testing with independent interpretation.  Patient has had similar chest pain for couple of years.  He underwent nuclear stress test in 2010, heart catheterization in 2019, echocardiogram and cardiac MRI in 2020.  He was noted to have severe LVH, predominantly at his apex.  While echocardiogram had raised the possibility of hypertrophic cardiomyopathy, MRI at that time was inconclusive.  Differential diagnosis at that time included hypertensive versus hypertrophic cardiomyopathy.  His blood pressure is in 140-150s/90s on metoprolol succinate 50 mg daily and olmesartan 40 mg daily.  Coronary angiography at that time showed left dominant circulation with no significant CAD.  It was presumed that his angina was  microvascular in nature, related to supply demand mismatch in the setting of left ventricular hypertrophy.  On further history from the patient, it does appear that he has had sudden deaths in the family.  Patient's father was noted to have some heart problem, details not known.  He died suddenly at home and is mid 67s.  Patient denies father having undergone any CABG or pacemaker placement etc.  Patient's paternal uncle had sudden death in his 47s, was not known to have any prior cardiac problems.  Patient's brother was hospitalized for some cardiac problem, details not known.  He was awaiting discharge is when he suddenly died at age 36.   Past Medical History:  Diagnosis Date   Arthritis    type?   Cardiomegaly 2010   [facility]   Chest pain Chronic   GERD (gastroesophageal reflux disease)    Heart murmur congenital   Hepatic steatosis    Hyperlipidemia    Hypertension    OSA on CPAP 2010   Psoriasis    used to see derm    Sleep apnea      Past Surgical History:  Procedure Laterality Date   CARDIAC CATHETERIZATION     ESOPHAGOGASTRODUODENOSCOPY  09/13/2011   Procedure: ESOPHAGOGASTRODUODENOSCOPY (EGD);  Surgeon: Estanislado Emms., MD,FACG;  Location: [facility];  Service: Endoscopy;  Laterality: N/A;   INGUINAL HERNIA REPAIR Right    LEFT HEART CATH AND CORONARY ANGIOGRAPHY N/A 07/29/2018   Procedure: LEFT HEART CATH AND CORONARY ANGIOGRAPHY;  Surgeon: Belva Crome, MD;  Location: [facility];  Service: Cardiovascular;  Laterality: N/A;     Social History   Tobacco Use  Smoking Status Former   Types: Cigars   Quit date: 2002   Years since quitting: 20.8  Smokeless Tobacco Never    Social History   Substance and Sexual Activity  Alcohol Use Yes   Comment: wine- occasionally     Family History  Problem Relation Age of Onset   Multiple sclerosis Mother    Diabetes Mother    Heart attack Father    Diabetes Sister    Heart disease Brother     Diabetes Brother    Heart disease Brother    Heart disease Brother    Heart disease Brother    Heart disease Brother    Heart disease Brother    Diabetes Maternal Uncle    Heart disease Maternal Grandmother    Colon cancer Neg Hx    Prostate cancer Neg Hx      Current Outpatient Medications on File Prior to Visit  Medication Sig Dispense Refill   albuterol (VENTOLIN HFA) 108 (90 Base) MCG/ACT inhaler Inhale 2 puffs into the lungs every 6 (six) hours as needed for wheezing or shortness of breath.      aspirin EC 81 MG tablet Take 1 tablet (81 mg total) by mouth daily. Swallow whole. 30 tablet 3   atorvastatin (LIPITOR) 80 MG tablet Take 1 tablet (80 mg total) by mouth at bedtime. 30 tablet 4   cyclobenzaprine (FLEXERIL) 10 MG tablet Take 1 tablet (10 mg total) by mouth 2 (two) times daily as needed for muscle spasms. 12 tablet 0   ezetimibe (ZETIA) 10 MG tablet Take 1 tablet (10 mg total) by mouth daily. 30 tablet 3   fluticasone furoate-vilanterol (BREO ELLIPTA) 200-25 MCG/INH AEPB Inhale into the lungs.     metoprolol succinate (TOPROL-XL) 100 MG 24 hr tablet Take 1 tablet (100 mg total) by mouth daily. Take with or immediately following a meal. 90 tablet 3   nitroGLYCERIN (NITROSTAT) 0.4 MG SL tablet Place 0.4 mg under the tongue every 5 (five) minutes as needed for chest pain.     olmesartan (BENICAR) 40 MG tablet Take 40 mg by mouth daily.     pantoprazole (PROTONIX) 40 MG tablet Take 1 tablet (40 mg total) by mouth daily with breakfast. 30 tablet 1   polyethylene glycol powder (GLYCOLAX/MIRALAX) powder Take 17 g by mouth daily as needed for moderate constipation.      triamcinolone cream (KENALOG) 0.5 % APPLY TO AFFECTED AREA TWICE A DAY 30 g 0   valACYclovir (VALTREX) 1000 MG tablet Take 1,000 mg by mouth daily.     No current facility-administered medications on file prior to visit.    Cardiovascular and other pertinent studies:  Lexiscan/modified Bruce Tetrofosmin stress  test 05/15/2021: Lexiscan/modified Bruce nuclear stress test performed using 1-day protocol. Patient walked for 1.6 METS and reached 66% MPHR.  Normal myocardial perfusion. Stress LVEF 68%. Low risk study.   EKG 04/21/2021: Sinus rhythm 68 npm  RSR(V1) -nondiagnostic  Extensive T-abnormality, unchanged compared to previous EKG in 2020.  Echocardiogram 01/2019:  1. The left ventricle has hyperdynamic systolic function, with an  ejection fraction of >65%. The cavity size was normal. There is severely  increased left ventricular wall thickness. Left ventricular diastolic  Doppler parameters are consistent with  impaired relaxation. Indeterminate filling pressures The E/e' is 8-15. No  evidence of left ventricular regional wall motion abnormalities.   2. The right ventricle has normal systolic function. The  cavity was  normal. There is no increase in right ventricular wall thickness.   3. The mitral valve is abnormal. Mild thickening of the mitral valve  leaflet.   4. The tricuspid valve is grossly normal.   5. The aortic valve was not well visualized. No stenosis of the aortic  valve.   SUMMARY    LVEF 65-70%, severe LVH (consider HCM or infiltrative  cardiomyopathy), normal wall motion, grade 1 DD, indeterminate LV  filling pressure, normal RV systolic function, normal biatrial size,  normal IVC   Cardiac MRI 01/2019: 1. Normal LV size with mild concentric LVH, vigorous systolic function with EF 72%. As above, possibly more hypertrophy towards the apex but not classic for apical hypertrophic cardiomyopathy. It is possible that this is a hypertensive cardiomyopathy. 2.  Normal RV size and systolic function, EF 68%. 3. Difficult LGE images, possible mid-wall LGE in the septum, but cannot rule out this being from prominent trabeculation on the RV size of the septum. Mid-wall LGE would be more in keeping with hypertrophic cardiomyopathy.  Difficult study, cannot rule out HCM variant  but not definitive.   Coronary angiography 06/2018: Left dominant coronary anatomy. Normal short left main. Normal large LAD which wraps around the left ventricular apex. Large dominant circumflex which gives the PDA.  The entire circumflex system is normal. Small nondominant right coronary. Hyperdynamic left ventricle with EF greater than 70%.  Elevated LVEDP raises question of left ventricular hypertrophy/hypertrophic myocardial process.  Hemodynamics are consistent with a degree of diastolic dysfunction/chronic diastolic heart failure.   RECOMMENDATIONS:   2D Doppler echocardiogram to assess for evidence of ventricular hypertrophy. Clinical diagnosis may be INOCA (ischemia with no obstructive coronary artery disease).  This could be on the basis of supply demand mismatch from microvascular disease. Aggressive risk factor management including blood pressure less than 130/80, lipid-lowering, management of sleep apnea, and weight loss.     Recent labs: 03/15/2021: Glucose 88, BUN/Cr 15/1.29. EGFR 68. Na/K 139/4.0. Rest of the CMP normal H/H 15/47. MCV 88. Platelets 243 HbA1C N/A Chol 185, TG 127, HDL 44, LDL 115 TSH N/A BNP 7.9 normal    Review of Systems  Cardiovascular:  Positive for chest pain, dyspnea on exertion and palpitations. Negative for leg swelling and syncope.       Presyncope        Vitals:   06/08/21 1426 06/08/21 1428  BP: (!) 168/91 (!) 172/93  Pulse: 84   SpO2: 97% 97%     Body mass index is 34.45 kg/m. Filed Weights   06/08/21 1426  Weight: 207 lb (93.9 kg)     Objective:   Physical Exam Vitals and nursing note reviewed.  Constitutional:      General: He is not in acute distress. Neck:     Vascular: No JVD.  Cardiovascular:     Rate and Rhythm: Normal rate and regular rhythm.     Heart sounds: Normal heart sounds. No murmur heard. Pulmonary:     Effort: Pulmonary effort is normal.     Breath sounds: Normal breath sounds. No wheezing or  rales.  Musculoskeletal:     Right lower leg: No edema.     Left lower leg: No edema.     Assessment & Recommendations:   50 mg African-American male with hypertension, left ventricular hypertrophy, with worsening chest pain  Chest pain: No ischemia on stress testing (34.1962). Coronary angiography in 2019 with left dominant circulation and no obstructive CAD.  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  Okay to stop Aspirin. Continue metoprolol succinate 100 mg daily.  Added amlodipine 10 mg daily. Continue Lipitor 80 mg, Zetia 10 mg daily.  F/u w/Celeste Cantwell, PA in 4 weeks    Nigel Mormon, MD Pager: [phone] Office: [phone]

## 2021-06-09 ENCOUNTER — Encounter: Payer: Self-pay | Admitting: Cardiology

## 2021-06-14 NOTE — Progress Notes (Signed)
Called patient, NA, LMAM

## 2021-06-15 NOTE — Progress Notes (Signed)
2nd attempt : Called patient, NA, VM-box is full and cannot leave any messages at this time.

## 2021-08-14 ENCOUNTER — Other Ambulatory Visit: Payer: Self-pay

## 2021-08-14 ENCOUNTER — Encounter: Payer: Self-pay | Admitting: Cardiology

## 2021-08-14 ENCOUNTER — Ambulatory Visit: Payer: BC Managed Care – PPO | Admitting: Cardiology

## 2021-08-14 VITALS — BP 135/85 | HR 64 | Temp 98.0°F | Resp 16 | Ht 65.0 in | Wt 208.0 lb

## 2021-08-14 DIAGNOSIS — E782 Mixed hyperlipidemia: Secondary | ICD-10-CM

## 2021-08-14 DIAGNOSIS — I10 Essential (primary) hypertension: Secondary | ICD-10-CM

## 2021-08-14 DIAGNOSIS — R0683 Snoring: Secondary | ICD-10-CM | POA: Insufficient documentation

## 2021-08-14 NOTE — Progress Notes (Signed)
° ° °Patient referred by Bouska, David, MD for chest pain ° °Subjective:  ° °Jacob Cannon, male    DOB: 10/27/1970, 50 y.o.   MRN: [id number] ° °Chief Complaint  °Patient presents with  ° Hypertension  ° Follow-up  °  4 week  ° ° ° °HPI ° °50 y.o. African-American male with chest pain ° °Patient continues to have chest wall pain, with or without physical activity. Blood pressure is now better controlled. ° °Initial consultation visit 04/2021: °Patient is here with his wife today.  He works in [employer], with his work profile being physically demanding.  Patient has noticed retrosternal chest tightness with physical exertion.  He particularly notices this while push mowing his grass lawn at home.  Symptoms improved with rest.  In addition, he also reports episodes of flutter sensation lasting for 2-3 minutes.  Some of his chest pain and flutter episodes are associated with complaints of "fading away".  His wife reports that during these episodes, he is not responsive and does not recollect the exact sequence of events on regaining complete awareness.  He has not had any syncope, but has had presyncopal episodes. ° °Extensively reviewed previous cardiac testing with independent interpretation.  Patient has had similar chest pain for couple of years.  He underwent nuclear stress test in 2010, heart catheterization in 2019, echocardiogram and cardiac MRI in 2020.  He was noted to have severe LVH, predominantly at his apex.  While echocardiogram had raised the possibility of hypertrophic cardiomyopathy, MRI at that time was inconclusive.  Differential diagnosis at that time included hypertensive versus hypertrophic cardiomyopathy.  His blood pressure is in 140-150s/90s on metoprolol succinate 50 mg daily and olmesartan 40 mg daily.  Coronary angiography at that time showed left dominant circulation with no significant CAD.  It was presumed that his angina was microvascular in nature, related to supply demand  mismatch in the setting of left ventricular hypertrophy. ° °On further history from the patient, it does appear that he has had sudden deaths in the family.  Patient's father was noted to have some heart problem, details not known.  He died suddenly at home and is mid 60s.  Patient denies father having undergone any CABG or pacemaker placement etc.  Patient's paternal uncle had sudden death in his 50s, was not known to have any prior cardiac problems.  Patient's brother was hospitalized for some cardiac problem, details not known.  He was awaiting discharge is when he suddenly died at age 52. ° °Current Outpatient Medications on File Prior to Visit  °Medication Sig Dispense Refill  ° albuterol (VENTOLIN HFA) 108 (90 Base) MCG/ACT inhaler Inhale 2 puffs into the lungs every 6 (six) hours as needed for wheezing or shortness of breath.     ° amLODipine (NORVASC) 10 MG tablet Take 1 tablet (10 mg total) by mouth daily. 30 tablet 3  ° atorvastatin (LIPITOR) 80 MG tablet Take 1 tablet (80 mg total) by mouth at bedtime. 30 tablet 4  ° cyclobenzaprine (FLEXERIL) 10 MG tablet Take 1 tablet (10 mg total) by mouth 2 (two) times daily as needed for muscle spasms. 12 tablet 0  ° ezetimibe (ZETIA) 10 MG tablet Take 1 tablet (10 mg total) by mouth daily. 30 tablet 3  ° fluticasone furoate-vilanterol (BREO ELLIPTA) 200-25 MCG/INH AEPB Inhale into the lungs.    ° metoprolol succinate (TOPROL-XL) 100 MG 24 hr tablet Take 1 tablet (100 mg total) by mouth daily. Take with or immediately following   following a meal. 90 tablet 3   nitroGLYCERIN (NITROSTAT) 0.4 MG SL tablet Place 0.4 mg under the tongue every 5 (five) minutes as needed for chest pain.     olmesartan (BENICAR) 40 MG tablet Take 40 mg by mouth daily.     pantoprazole (PROTONIX) 40 MG tablet Take 1 tablet (40 mg total) by mouth daily with breakfast. 30 tablet 1   polyethylene glycol powder (GLYCOLAX/MIRALAX) powder Take 17 g by mouth daily as needed for moderate constipation.       triamcinolone cream (KENALOG) 0.5 % APPLY TO AFFECTED AREA TWICE A DAY 30 g 0   valACYclovir (VALTREX) 1000 MG tablet Take 1,000 mg by mouth daily.     No current facility-administered medications on file prior to visit.    Cardiovascular and other pertinent studies:  Lexiscan/modified Bruce Tetrofosmin stress test 05/15/2021: Lexiscan/modified Bruce nuclear stress test performed using 1-day protocol. Patient walked for 1.6 METS and reached 66% MPHR.  Normal myocardial perfusion. Stress LVEF 68%. Low risk study.   EKG 04/21/2021: Sinus rhythm 68 npm  RSR(V1) -nondiagnostic  Extensive T-abnormality, unchanged compared to previous EKG in 2020.  Echocardiogram 01/2019:  1. The left ventricle has hyperdynamic systolic function, with an  ejection fraction of >65%. The cavity size was normal. There is severely  increased left ventricular wall thickness. Left ventricular diastolic  Doppler parameters are consistent with  impaired relaxation. Indeterminate filling pressures The E/e' is 8-15. No  evidence of left ventricular regional wall motion abnormalities.   2. The right ventricle has normal systolic function. The cavity was  normal. There is no increase in right ventricular wall thickness.   3. The mitral valve is abnormal. Mild thickening of the mitral valve  leaflet.   4. The tricuspid valve is grossly normal.   5. The aortic valve was not well visualized. No stenosis of the aortic  valve.   SUMMARY    LVEF 65-70%, severe LVH (consider HCM or infiltrative  cardiomyopathy), normal wall motion, grade 1 DD, indeterminate LV  filling pressure, normal RV systolic function, normal biatrial size,  normal IVC   Cardiac MRI 01/2019: 1. Normal LV size with mild concentric LVH, vigorous systolic function with EF 72%. As above, possibly more hypertrophy towards the apex but not classic for apical hypertrophic cardiomyopathy. It is possible that this is a hypertensive cardiomyopathy. 2.   Normal RV size and systolic function, EF 21%. 3. Difficult LGE images, possible mid-wall LGE in the septum, but cannot rule out this being from prominent trabeculation on the RV size of the septum. Mid-wall LGE would be more in keeping with hypertrophic cardiomyopathy.  Difficult study, cannot rule out HCM variant but not definitive.   Coronary angiography 06/2018: Left dominant coronary anatomy. Normal short left main. Normal large LAD which wraps around the left ventricular apex. Large dominant circumflex which gives the PDA.  The entire circumflex system is normal. Small nondominant right coronary. Hyperdynamic left ventricle with EF greater than 70%.  Elevated LVEDP raises question of left ventricular hypertrophy/hypertrophic myocardial process.  Hemodynamics are consistent with a degree of diastolic dysfunction/chronic diastolic heart failure.   RECOMMENDATIONS:   2D Doppler echocardiogram to assess for evidence of ventricular hypertrophy. Clinical diagnosis may be INOCA (ischemia with no obstructive coronary artery disease).  This could be on the basis of supply demand mismatch from microvascular disease. Aggressive risk factor management including blood pressure less than 130/80, lipid-lowering, management of sleep apnea, and weight loss.     Recent labs:  Glucose 88, BUN/Cr 15/1.29. EGFR 68. Na/K 139/4.0. Rest of the CMP normal °H/H 15/47. MCV 88. Platelets 243 °HbA1C N/A °Chol 185, TG 127, HDL 44, LDL 115 °TSH N/A °BNP 7.9 normal ° ° ° °Review of Systems  °Cardiovascular:  Positive for chest pain, dyspnea on exertion and palpitations. Negative for leg swelling and syncope.  °     Presyncope  °Respiratory:  Positive for snoring.   ° °   ° ° °Vitals:  ° 08/14/21 1113  °BP: 135/85  °Pulse: 64  °Resp: 16  °Temp: 98 °F (36.7 °C)  °SpO2: 97%  ° ° ° °Body mass index is 34.61 kg/m². °Filed Weights  ° 08/14/21 1113  °Weight: 208 lb (94.3 kg)  ° ° ° °Objective:  ° Physical  Exam °Vitals and nursing note reviewed.  °Constitutional:   °   General: He is not in acute distress. °Neck:  °   Vascular: No JVD.  °Cardiovascular:  °   Rate and Rhythm: Normal rate and regular rhythm.  °   Heart sounds: Normal heart sounds. No murmur heard. °Pulmonary:  °   Effort: Pulmonary effort is normal.  °   Breath sounds: Normal breath sounds. No wheezing or rales.  °Musculoskeletal:  °   Right lower leg: No edema.  °   Left lower leg: No edema.  ° ° ° °Assessment & Recommendations:  ° °50 mg African-American male with hypertension, left ventricular hypertrophy, with worsening chest pain ° °Chest pain: °No ischemia on stress testing (10.2022). Coronary angiography in 2019 with left dominant circulation and no obstructive CAD.  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  However, chest pain persists in spite of well controlled blood pressure. At this point, I suspect this pain is noncardiac in nature.  ° °Hypertension: °Well controlled °Given his snoring and hypertension, referred to sleep study. ° °Mixed hyperlipidemia: °Check lipid panel. Continue Lipitor 80 mg, Zetia 10 mg daily. ° °F/u in 6 months ° ° ° J , MD °Pager: [phone] °Office: [phone] ° °

## 2021-08-17 ENCOUNTER — Ambulatory Visit: Payer: BC Managed Care – PPO | Admitting: Cardiology

## 2022-02-16 ENCOUNTER — Ambulatory Visit: Payer: BC Managed Care – PPO | Admitting: Cardiology

## 2022-03-09 ENCOUNTER — Other Ambulatory Visit: Payer: Self-pay | Admitting: Cardiology

## 2022-03-09 DIAGNOSIS — I10 Essential (primary) hypertension: Secondary | ICD-10-CM

## 2023-02-15 ENCOUNTER — Other Ambulatory Visit: Payer: Self-pay

## 2023-02-15 ENCOUNTER — Emergency Department (HOSPITAL_COMMUNITY)
Admission: EM | Admit: 2023-02-15 | Discharge: 2023-02-15 | Disposition: A | Discharge status: Home / Self Care | Payer: BC Managed Care – PPO | Attending: Emergency Medicine | Admitting: Emergency Medicine

## 2023-02-15 ENCOUNTER — Emergency Department (HOSPITAL_COMMUNITY): Payer: BC Managed Care – PPO

## 2023-02-15 ENCOUNTER — Encounter (HOSPITAL_COMMUNITY): Payer: Self-pay

## 2023-02-15 DIAGNOSIS — R9431 Abnormal electrocardiogram [ECG] [EKG]: Secondary | ICD-10-CM | POA: Insufficient documentation

## 2023-02-15 DIAGNOSIS — R600 Localized edema: Secondary | ICD-10-CM | POA: Insufficient documentation

## 2023-02-15 DIAGNOSIS — R609 Edema, unspecified: Secondary | ICD-10-CM

## 2023-02-15 LAB — CBC
HCT: 45.8 % (ref 39.0–52.0)
Hemoglobin: 15.2 g/dL (ref 13.0–17.0)
MCH: 29.9 pg (ref 26.0–34.0)
MCHC: 33.2 g/dL (ref 30.0–36.0)
MCV: 90 fL (ref 80.0–100.0)
Platelets: 222 10*3/uL (ref 150–400)
RBC: 5.09 MIL/uL (ref 4.22–5.81)
RDW: 13.8 % (ref 11.5–15.5)
WBC: 6.8 10*3/uL (ref 4.0–10.5)
nRBC: 0 % (ref 0.0–0.2)

## 2023-02-15 LAB — BASIC METABOLIC PANEL
Anion gap: 10 (ref 5–15)
BUN: 13 mg/dL (ref 6–20)
CO2: 23 mmol/L (ref 22–32)
Calcium: 8.7 mg/dL — ABNORMAL LOW (ref 8.9–10.3)
Chloride: 106 mmol/L (ref 98–111)
Creatinine, Ser: 1.05 mg/dL (ref 0.61–1.24)
GFR, Estimated: >60 mL/min (ref >60)
Glucose, Bld: 98 mg/dL (ref 70–99)
Potassium: 4 mmol/L (ref 3.5–5.1)
Sodium: 139 mmol/L (ref 135–145)

## 2023-02-15 LAB — TROPONIN I (HIGH SENSITIVITY): Troponin I (High Sensitivity): 13 ng/L (ref <18)

## 2023-02-15 NOTE — ED Triage Notes (Signed)
Pt to ED c/o abnormal EKG, pt was at PCP today for physical and they were concerned. Pt currently denies chest pain, but reports has been having chest pain at night x 2 weeks and relieved with nitroglycerin. Denies SHOB/N/V.

## 2023-02-15 NOTE — ED Provider Triage Note (Signed)
Emergency Medicine Provider Triage Evaluation Note  Jacob Cannon , a 52 y.o. male  was evaluated in triage.  Pt complains of abnormal EKG.  Review of Systems  Positive:  Negative:   Physical Exam  BP (!) 144/90 (BP Location: Right Arm)   Pulse 70   Temp 99.1 F (37.3 C) (Oral)   Resp 18   Ht 5\' 5"  (1.651 m)   Wt 95.3 kg   SpO2 97%   BMI 34.95 kg/m  Gen:   Awake, no distress   Resp:  Normal effort  MSK:   Moves extremities without difficulty  Other:    Medical Decision Making  Medically screening exam initiated at 6:46 PM.  Appropriate orders placed.  Wayne Sever was informed that the remainder of the evaluation will be completed by another provider, this initial triage assessment does not replace that evaluation, and the importance of remaining in the ED until their evaluation is complete.  Complains of chest pain at night x2 weeks when laying down. Takes NTG at night and symptoms resolve. Went to PCP today who was concerned for patient's EKG.  Hx GERD and bronchitis. Also stating that he gets chest tightness when walking long distances that resolves within 10 min of rest.  Denies fever, dyspnea, cough, abdominal pain, nausea, vomiting.   Dorthy Cooler, [place] 02/15/23 [phone]

## 2023-02-15 NOTE — ED Provider Notes (Signed)
[place] EMERGENCY DEPARTMENT AT [facility] Provider Note   CSN: [id number] Arrival date & time: 02/15/23  1810     History Chief Complaint  Patient presents with   Abnormal ECG    HPI Jacob Cannon is a 52 y.o. male presenting for chief complaint of EKG changes. Was seen at a new PCP today after avoiding primary care for the last 4 years due to the coronavirus crisis. Has a history of hypertension hyperlipidemia but has not been on any medications for the last 4 years.  Additionally has a history of OSA supposed on CPAP but when he stopped going to his doctor he stopped using his CPAP. When he showed up to his PCP today was found have bilateral pedal edema, T wave inversions in the anterior leads and endorsed daytime somnolence was sent to the emergency department for ACS rule out. He denies any chest pain.  Denies fevers chills nausea vomiting syncope or shortness of breath.  Is otherwise ambulatory tolerating p.o. intake..   Patient's recorded medical, surgical, social, medication list and allergies were reviewed in the Snapshot window as part of the initial history.   Review of Systems   Review of Systems  Constitutional:  Negative for chills and fever.  HENT:  Negative for ear pain and sore throat.   Eyes:  Negative for pain and visual disturbance.  Respiratory:  Negative for cough and shortness of breath.   Cardiovascular:  Positive for leg swelling. Negative for chest pain and palpitations.  Gastrointestinal:  Negative for abdominal pain and vomiting.  Genitourinary:  Negative for dysuria and hematuria.  Musculoskeletal:  Negative for arthralgias and back pain.  Skin:  Negative for color change and rash.  Neurological:  Negative for seizures and syncope.  All other systems reviewed and are negative.   Physical Exam Updated Vital Signs BP (!) 144/90 (BP Location: Right Arm)   Pulse 70   Temp 99.1 F (37.3 C) (Oral)   Resp 18   Ht 5\' 5"  (1.651 m)    Wt 95.3 kg   SpO2 97%   BMI 34.95 kg/m  Physical Exam Vitals and nursing note reviewed.  Constitutional:      General: He is not in acute distress.    Appearance: He is well-developed.  HENT:     Head: Normocephalic and atraumatic.  Eyes:     Conjunctiva/sclera: Conjunctivae normal.  Cardiovascular:     Rate and Rhythm: Normal rate and regular rhythm.     Heart sounds: No murmur heard. Pulmonary:     Effort: Pulmonary effort is normal. No respiratory distress.     Breath sounds: Normal breath sounds.  Abdominal:     Palpations: Abdomen is soft.     Tenderness: There is no abdominal tenderness.  Musculoskeletal:        General: No swelling.     Cervical back: Neck supple.  Skin:    General: Skin is warm and dry.     Capillary Refill: Capillary refill takes less than 2 seconds.  Neurological:     Mental Status: He is alert.  Psychiatric:        Mood and Affect: Mood normal.      ED Course/ Medical Decision Making/ A&P    Procedures Procedures   Medications Ordered in ED Medications - No data to display  Medical Decision Making:    Jacob Cannon is a 52 y.o. male who presented to the ED today with concern for EKG changes/pedal edema  detailed above.     Patient placed on continuous vitals and telemetry monitoring while in ED which was reviewed periodically.   Complete initial physical exam performed, notably the patient  was hemodynamically stable no acute distress.  Currently asymptomatic.  He does have pedal edema 1+ in the bilateralLower extremities.      Reviewed and confirmed nursing documentation for past medical history, family history, social history.    Initial Assessment:   Concerning his edema, this is likely physiologic/dependent based on his working from a standing position, longstanding history of hypertension, OSA and medication noncompliance.  He was prescribed hydrochlorothiazide today to treat his comorbid hypertensive disease. Will add on  recommendation for compression stockings and repeat evaluation with his PCP within 48 hours for ongoing care and management. Concerning the EKG changes.  He has deep T wave inversions diffusely.  Reviewed his last EKG from 2022 and these are all present at that time as well.  Will evaluate for ACS with troponin or other metabolic abnormality with CBC CMP and plan for reassessment of symptoms after observation period in emergency room This is most consistent with an acute life/limb threatening illness complicated by underlying chronic conditions.   Initial Study Results:   Laboratory  All laboratory results reviewed without evidence of clinically relevant pathology.   EKG EKG was reviewed independently. Rate, rhythm, axis, intervals all examined and without medically relevant abnormality. ST segments without concerns for elevations.    Radiology  All images reviewed independently. Agree with radiology report at this time.   DG Chest 2 View  Result Date: 02/15/2023 CLINICAL DATA:  Chest pain EXAM: CHEST - 2 VIEW COMPARISON:  02/09/2019 FINDINGS: The heart size and mediastinal contours are within normal limits. Both lungs are clear. The visualized skeletal structures are unremarkable. IMPRESSION: No active cardiopulmonary disease. Electronically Signed   By: Charlett Nose M.D.   On: 02/15/2023 19:10     Reassessment and Plan:   On reassessment, patient remains without any chest pain after 2 neb hours of observation in the emergency room.  Negative troponin EKG grossly benign x-ray with no focal pathology otherwise.  Patient stable for outpatient care and management given low heart score, asymptomatic nature and negative troponin.  Patient needs to be medically compliant with his CPAP and new medications and follow-up with his PCP for close outpatient care and management.  Strict return precautions reinforced and patient expressed understanding.    Clinical Impression:  1. Abnormal EKG   2. Edema,  unspecified type      Discharge   Final Clinical Impression(s) / ED Diagnoses Final diagnoses:  Abnormal EKG  Edema, unspecified type    Rx / DC Orders ED Discharge Orders     None         Glyn Ade, MD 02/15/23 2041

## 2023-05-13 ENCOUNTER — Emergency Department (HOSPITAL_BASED_OUTPATIENT_CLINIC_OR_DEPARTMENT_OTHER): Payer: BC Managed Care – PPO

## 2023-05-13 ENCOUNTER — Encounter (HOSPITAL_BASED_OUTPATIENT_CLINIC_OR_DEPARTMENT_OTHER): Payer: Self-pay | Admitting: *Deleted

## 2023-05-13 ENCOUNTER — Other Ambulatory Visit: Payer: Self-pay

## 2023-05-13 ENCOUNTER — Emergency Department (HOSPITAL_BASED_OUTPATIENT_CLINIC_OR_DEPARTMENT_OTHER)
Admission: EM | Admit: 2023-05-13 | Discharge: 2023-05-13 | Disposition: A | Discharge status: Home / Self Care | Payer: BC Managed Care – PPO | Attending: Emergency Medicine | Admitting: Emergency Medicine

## 2023-05-13 DIAGNOSIS — R519 Headache, unspecified: Secondary | ICD-10-CM | POA: Insufficient documentation

## 2023-05-13 DIAGNOSIS — M5030 Other cervical disc degeneration, unspecified cervical region: Secondary | ICD-10-CM

## 2023-05-13 DIAGNOSIS — M542 Cervicalgia: Secondary | ICD-10-CM

## 2023-05-13 DIAGNOSIS — I10 Essential (primary) hypertension: Secondary | ICD-10-CM | POA: Insufficient documentation

## 2023-05-13 DIAGNOSIS — Z87891 Personal history of nicotine dependence: Secondary | ICD-10-CM | POA: Insufficient documentation

## 2023-05-13 DIAGNOSIS — M5020 Other cervical disc displacement, unspecified cervical region: Secondary | ICD-10-CM

## 2023-05-13 LAB — CBC
HCT: 46.7 % (ref 39.0–52.0)
Hemoglobin: 15.7 g/dL (ref 13.0–17.0)
MCH: 29.7 pg (ref 26.0–34.0)
MCHC: 33.6 g/dL (ref 30.0–36.0)
MCV: 88.4 fL (ref 80.0–100.0)
Platelets: 237 10*3/uL (ref 150–400)
RBC: 5.28 MIL/uL (ref 4.22–5.81)
RDW: 14.1 % (ref 11.5–15.5)
WBC: 5.1 10*3/uL (ref 4.0–10.5)
nRBC: 0 % (ref 0.0–0.2)

## 2023-05-13 LAB — COMPREHENSIVE METABOLIC PANEL
ALT: 24 U/L (ref 0–44)
AST: 22 U/L (ref 15–41)
Albumin: 4.4 g/dL (ref 3.5–5.0)
Alkaline Phosphatase: 54 U/L (ref 38–126)
Anion gap: 7 (ref 5–15)
BUN: 14 mg/dL (ref 6–20)
CO2: 29 mmol/L (ref 22–32)
Calcium: 9.4 mg/dL (ref 8.9–10.3)
Chloride: 103 mmol/L (ref 98–111)
Creatinine, Ser: 1.07 mg/dL (ref 0.61–1.24)
GFR, Estimated: >60 mL/min (ref >60)
Glucose, Bld: 127 mg/dL — ABNORMAL HIGH (ref 70–99)
Potassium: 3.7 mmol/L (ref 3.5–5.1)
Sodium: 139 mmol/L (ref 135–145)
Total Bilirubin: 0.7 mg/dL (ref 0.3–1.2)
Total Protein: 7.1 g/dL (ref 6.5–8.1)

## 2023-05-13 MED ORDER — CYCLOBENZAPRINE HCL 5 MG PO TABS: 5.0000 mg | ORAL_TABLET | Freq: Once | ORAL | Status: DC

## 2023-05-13 MED ORDER — PROCHLORPERAZINE EDISYLATE 10 MG/2ML IJ SOLN
10.0000 mg | Freq: Once | INTRAMUSCULAR | Status: AC
  Administered 2023-05-13: 10 mg via INTRAVENOUS
  Filled 2023-05-13: qty 2

## 2023-05-13 MED ORDER — HYDROCODONE-ACETAMINOPHEN 5-325 MG PO TABS: 2.0000 | ORAL_TABLET | ORAL | 0 refills | Status: AC | PRN

## 2023-05-13 MED ORDER — HYDROCODONE-ACETAMINOPHEN 5-325 MG PO TABS: 1.0000 | ORAL_TABLET | Freq: Once | ORAL | Status: DC

## 2023-05-13 MED ORDER — DIPHENHYDRAMINE HCL 50 MG/ML IJ SOLN
25.0000 mg | Freq: Once | INTRAMUSCULAR | Status: AC
  Administered 2023-05-13: 25 mg via INTRAVENOUS
  Filled 2023-05-13: qty 1

## 2023-05-13 MED ORDER — KETOROLAC TROMETHAMINE 15 MG/ML IJ SOLN
15.0000 mg | Freq: Once | INTRAMUSCULAR | Status: AC
  Administered 2023-05-13: 15 mg via INTRAVENOUS
  Filled 2023-05-13: qty 1

## 2023-05-13 MED ORDER — CYCLOBENZAPRINE HCL 10 MG PO TABS: 10.0000 mg | ORAL_TABLET | Freq: Two times a day (BID) | ORAL | 0 refills | Status: AC | PRN

## 2023-05-13 MED ORDER — KETOROLAC TROMETHAMINE 60 MG/2ML IM SOLN: 60.0000 mg | Freq: Once | INTRAMUSCULAR | Status: DC

## 2023-05-13 MED ORDER — LIDOCAINE 5 % EX PTCH: 1.0000 | MEDICATED_PATCH | CUTANEOUS | 0 refills | Status: AC

## 2023-05-13 MED ORDER — IOHEXOL 350 MG/ML SOLN
100.0000 mL | Freq: Once | INTRAVENOUS | Status: AC | PRN
  Administered 2023-05-13: 80 mL via INTRAVENOUS

## 2023-05-13 MED ORDER — KETOROLAC TROMETHAMINE 10 MG PO TABS: 10.0000 mg | ORAL_TABLET | Freq: Four times a day (QID) | ORAL | 0 refills | Status: AC | PRN

## 2023-05-13 NOTE — ED Provider Notes (Signed)
[facility] Emergency Department Provider Note MRN:  [id number]  Arrival date & time: 05/13/23     Chief Complaint   Headache   History of Present Illness   Jacob Cannon is a 52 y.o. year-old male with a history of hypertension presenting to the ED with chief complaint of headache.  Sudden onset headache a week ago.  Started in the neck and the back of the head and has persisted, has become a global headache, behind the eyes, still in the neck and back of the head.  Denies numbness or weakness to the arms or legs, no trouble with speech or vision, no other complaints.  Review of Systems  A thorough review of systems was obtained and all systems are negative except as noted in the HPI and PMH.   Patient's Health History    Past Medical History:  Diagnosis Date   Arthritis    type?   Cardiomegaly 2010   [facility]   Chest pain Chronic   GERD (gastroesophageal reflux disease)    Heart murmur congenital   Hepatic steatosis    Hyperlipidemia    Hypertension    OSA on CPAP 2010   Psoriasis    used to see derm    Sleep apnea     Past Surgical History:  Procedure Laterality Date   ESOPHAGOGASTRODUODENOSCOPY  09/13/2011   Procedure: ESOPHAGOGASTRODUODENOSCOPY (EGD);  Surgeon: Eliezer Bottom., MD,FACG;  Location: [facility];  Service: Endoscopy;  Laterality: N/A;   INGUINAL HERNIA REPAIR Right    LEFT HEART CATH AND CORONARY ANGIOGRAPHY N/A 07/29/2018   Procedure: LEFT HEART CATH AND CORONARY ANGIOGRAPHY;  Surgeon: Lyn Records, MD;  Location: [facility] INVASIVE CV LAB;  Service: Cardiovascular;  Laterality: N/A;    Family History  Problem Relation Age of Onset   Multiple sclerosis Mother    Diabetes Mother    Heart attack Father    Diabetes Sister    Heart disease Brother    Diabetes Brother    Heart disease Brother    Heart disease Brother    Heart disease Brother    Heart disease Brother    Heart disease Brother    Diabetes  Maternal Uncle    Heart disease Maternal Grandmother    Colon cancer Neg Hx    Prostate cancer Neg Hx     Social History   Socioeconomic History   Marital status: Married    Spouse name: Not on file   Number of children: 3   Years of education: Not on file   Highest education level: Not on file  Occupational History    Employer: [employer]  Tobacco Use   Smoking status: Former    Types: Cigars    Quit date: 2002    Years since quitting: 22.8   Smokeless tobacco: Never  Vaping Use   Vaping status: Never Used  Substance and Sexual Activity   Alcohol use: Yes    Comment: wine- occasionally   Drug use: No   Sexual activity: Never  Other Topics Concern   Not on file  Social History Narrative   3 biological kids and 5 adopted---   Social Determinants of Health   Financial Resource Strain: Low Risk  (02/15/2023)   Received from [facility]   Overall Financial Resource Strain (CARDIA)    Difficulty of Paying Living Expenses: Not hard at all  Food Insecurity: No Food Insecurity (02/15/2023)   [facility]   Hunger Vital  Sign    Worried About Programme researcher, broadcasting/film/video in the Last Year: Never true    Ran Out of Food in the Last Year: Never true  Transportation Needs: No Transportation Needs (02/15/2023)   Received from [facility] - Transportation    Lack of Transportation (Medical): No    Lack of Transportation (Non-Medical): No  Physical Activity: Sufficiently Active (02/15/2023)   Received from [facility]   Exercise Vital Sign    Days of Exercise per Week: 7 days    Minutes of Exercise per Session: 30 min  Stress: No Stress Concern Present (02/15/2023)   Received from [facility] - Occupational Stress Questionnaire    Feeling of Stress : Only a little  Social Connections: Unknown (12/11/2021)   Received from [facility]   Social Network    Social Network: Not on file  Intimate Partner  Violence: Not At Risk (02/15/2023)   Received from [facility]   Humiliation, Afraid, Rape, and Kick questionnaire    Fear of Current or Ex-Partner: No    Emotionally Abused: No    Physically Abused: No    Sexually Abused: No     Physical Exam   Vitals:   05/13/23 0621  BP: 130/87  Pulse: 67  Resp: 20  Temp: 97.7 F (36.5 C)  SpO2: 98%    CONSTITUTIONAL: Well-appearing, NAD NEURO/PSYCH:  Alert and oriented x 3, normal and symmetric strength and sensation, normal coordination, normal speech EYES:  eyes equal and reactive ENT/NECK:  no LAD, no JVD CARDIO: Regular rate, well-perfused, normal S1 and S2 PULM:  CTAB no wheezing or rhonchi GI/GU:  non-distended, non-tender MSK/SPINE:  No gross deformities, no edema SKIN:  no rash, atraumatic   *Additional and/or pertinent findings included in MDM below  Diagnostic and Interventional Summary    EKG Interpretation Date/Time:    Ventricular Rate:    PR Interval:    QRS Duration:    QT Interval:    QTC Calculation:   R Axis:      Text Interpretation:         Labs Reviewed  CBC  COMPREHENSIVE METABOLIC PANEL    CT ANGIO HEAD NECK W WO CM    (Results Pending)    Medications  diphenhydrAMINE (BENADRYL) injection 25 mg (has no administration in time range)  prochlorperazine (COMPAZINE) injection 10 mg (has no administration in time range)     Procedures  /  Critical Care Procedures  ED Course and Medical Decision Making  Initial Impression and Ddx Differential diagnosis includes vertebral artery dissection, subarachnoid hemorrhage, intracranial mass, cervicogenic headache.  Past medical/surgical history that increases complexity of ED encounter: None  Interpretation of Diagnostics Labs, CT pending  Patient Reassessment and Ultimate Disposition/Management     Signed out to oncoming provider at shift change.  Patient management required discussion with the following services or consulting groups:   None  Complexity of Problems Addressed Acute illness or injury that poses threat of life of bodily function  Additional Data Reviewed and Analyzed Further history obtained from: Prior ED visit notes  Additional Factors Impacting ED Encounter Risk None  Elmer Sow. Pilar Plate, MD [facility] Emergency Medicine [facility] [email or web address]  Final Clinical Impressions(s) / ED Diagnoses     ICD-10-CM   1. Nonintractable headache, unspecified chronicity pattern, unspecified headache type  R51.9       ED Discharge Orders     None  Discharge Instructions Discussed with and Provided to Patient:   Discharge Instructions   None      Sabas Sous, MD 05/13/23 [phone]

## 2023-05-13 NOTE — ED Triage Notes (Addendum)
C/o posterior neck pain that radiates into the back of his head that started a few days ago. Denies any fevers. States pain also radiates into his left eye. Has been taking ibuprofen with little relief. Denies any injury. Denies any history of headaches/ stroke. Pt states recent chest pain-just completed wearing a heart monitor. Denies chest pain at present.  Denies any cough/congestion. Denies any weakness or visual issues.

## 2023-05-13 NOTE — Discharge Instructions (Addendum)
Your presentation is consistent with likely herniated disc in your cervical spine causing pinched nerve, your CT imaging revealed the following: IMPRESSION:  1. Normal CT appearance of the brain. Negative CTA head and neck, no  atherosclerosis or stenosis.  2. 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 Cervical Spine MRI without contrast may be  valuable in this setting.   Your neurologic exam today is overall reassuring with intact strength and sensation of all of your extremities with no red flag symptoms that would warrant emergent MRI.  If you develop extremity weakness or numbness, numbness in your groin in an area where you would sit on a saddle, urinary incontinence, fecal incontinence or any severe worsening symptoms, return to the emergency department for consideration for emergent MRI imaging.  Otherwise, treat your pain with medication set up and prescribed, follow-up outpatient with your PCP for continued longer-term pain management, follow-up outpatient with neurosurgery.  Do not drive or operate heavy machinery while taking opiates or Flexeril.  You will likely need to be on light duty until you can be cleared by a physician.

## 2023-05-13 NOTE — ED Notes (Signed)
Discharge paperwork given and verbally understood. 

## 2023-05-13 NOTE — ED Provider Notes (Signed)
  Physical Exam  BP 130/87   Pulse 67   Temp 97.7 F (36.5 C)   Resp 20   Ht 5\' 2"  (1.575 m)   Wt 81.6 kg   SpO2 98%   BMI 32.92 kg/m   Physical Exam  Procedures  Procedures  ED Course / MDM    Medical Decision Making Amount and/or Complexity of Data Reviewed Labs: ordered. Radiology: ordered.  Risk Prescription drug management.   44M presenting with a sudden onset headache, located posteriorly. Does not sounds thunderclap, do no think LP is indicated, CTA to rule out vertebral dissection, plan to reassess and dispo after.  CTA Head and Neck:  IMPRESSION:  1. Normal CT appearance of the brain. Negative CTA head and neck, no  atherosclerosis or stenosis.  2. 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 Cervical Spine MRI without contrast may be  valuable in this setting.    Neuro Exam: MENTAL STATUS EXAM:    Orientation: Alert and oriented. Memory: Cooperative, follows commands well.  Language: Speech is clear and language is normal.   CRANIAL NERVES:    CN 2 (Optic): Visual fields intact to confrontation.  CN 3,4,6 (EOM): Full extraocular eye movement without nystagmus.  CN 5 (Trigeminal): Facial sensation is normal, no weakness of masticatory muscles.  CN 7 (Facial): No facial weakness or asymmetry.  CN 8 (Auditory): Auditory acuity grossly normal.  CN 9,10 (Glossophar): The uvula is midline, the palate elevates symmetrically.  CN 11 (spinal access): Normal sternocleidomastoid and trapezius strength.  CN 12 (Hypoglossal): The tongue is midline. No atrophy or fasciculations.Marland Kitchen   MOTOR:  Muscle Strength: 5/5RUE, 5/5LUE, 5/5RLE, 5/5LLE  REFLEXES: No clonus, 2+ DTRs in the bilateral lower extremities.   COORDINATION:   No tremor.   SENSATION:   Intact to light touch all four extremities.  GAIT: Gait normal without ataxia   The patient has no red flag  symptoms of acute spinal cord compression.  He has 5 out of 5 strength in all 4 extremities, no clonus in the lower extremities bilaterally, 2+ DTRs in the bilateral lower extremities.  He denies any urinary or fecal incontinence, no saddle anesthesia.  On exam, he does have a positive Spurling sign indicating likely degenerative disc disease/herniated disc as seen on his CT imaging.  Given his reassuring neurologic exam, I do not think emergent MRI imaging is indicated at this time, will treat his pain and provide a referral to outpatient neurosurgery for follow-up.  He is afebrile with no meningismus on exam.  Does not describe a thunderclap headache, symptoms been ongoing for the past week and are primarily located in the neck radiating up.  Some cervical radiculopathy noted with positive Spurling sign.  Patient counseled on return precautions in the event of any severe worsening symptoms which would warrant emergent MRI imaging.  Overall stable for discharge at this time.     Ernie Avena, MD 05/13/23 [phone]

## 2023-11-16 ENCOUNTER — Ambulatory Visit
Admission: EM | Admit: 2023-11-16 | Discharge: 2023-11-16 | Disposition: A | Discharge status: Home / Self Care | Attending: Family Medicine | Admitting: Family Medicine

## 2023-11-16 DIAGNOSIS — J069 Acute upper respiratory infection, unspecified: Secondary | ICD-10-CM

## 2023-11-16 LAB — POC COVID19/FLU A&B COMBO
Covid Antigen, POC: NEGATIVE
Influenza A Antigen, POC: NEGATIVE
Influenza B Antigen, POC: NEGATIVE

## 2023-11-16 LAB — POCT RAPID STREP A (OFFICE): Rapid Strep A Screen: NEGATIVE

## 2023-11-16 MED ORDER — PROMETHAZINE-DM 6.25-15 MG/5ML PO SYRP: 5.0000 mL | ORAL_SOLUTION | Freq: Four times a day (QID) | ORAL | 0 refills | Status: AC | PRN

## 2023-11-16 MED ORDER — LIDOCAINE VISCOUS HCL 2 % MT SOLN: 10.0000 mL | OROMUCOSAL | 0 refills | Status: AC | PRN

## 2023-11-16 MED ORDER — AZELASTINE HCL 0.1 % NA SOLN: 1.0000 | Freq: Two times a day (BID) | NASAL | 0 refills | Status: AC

## 2023-11-16 NOTE — ED Triage Notes (Signed)
 Pt reports he has throat pain, cough, mucus in throat x 4 days

## 2023-11-16 NOTE — ED Provider Notes (Signed)
 [facility]    CSN: [id number] Arrival date & time: 11/16/23  1206      History   Chief Complaint No chief complaint on file.   HPI Jacob Cannon is a 53 y.o. male.   Patient presenting today with 4-day history of sore throat, cough, nasal congestion, sinus drainage.  Denies fever, chills, chest pain, shortness of breath, abdominal pain, vomiting, diarrhea.  So far not tried anything over-the-counter for symptoms other than muscle relaxers.  Multiple sick contacts recently including someone with COVID.    Past Medical History:  Diagnosis Date   Arthritis    type?   Cardiomegaly 2010   [facility]   Chest pain Chronic   GERD (gastroesophageal reflux disease)    Heart murmur congenital   Hepatic steatosis    Hyperlipidemia    Hypertension    OSA on CPAP 2010   Psoriasis    used to see derm    Sleep apnea     Patient Active Problem List   Diagnosis Date Noted   Snoring 08/14/2021   Postural dizziness with presyncope 04/21/2021   Mixed hyperlipidemia 04/21/2021   Nonspecific abnormal electrocardiogram (ECG) (EKG)    Hypertriglyceridemia 02/10/2019   Prediabetes 02/10/2019   Precordial pain 02/09/2019   Angina pectoris (HCC) 07/22/2018   Gastroesophageal reflux disease without esophagitis 12/13/2017   History of pancreatitis 12/13/2017   Acute pancreatitis 03/15/2016   Atrophic testicle 10/30/2013   Gastritis and gastroduodenitis 09/13/2011   Abdominal pain, right upper quadrant 09/12/2011   Chest pain 04/20/2011   Cough 01/15/2011   General medical examination 01/12/2011   Essential hypertension    Psoriasis    Heart murmur    OSA on CPAP    Left ventricular hypertrophy     Past Surgical History:  Procedure Laterality Date   ESOPHAGOGASTRODUODENOSCOPY  09/13/2011   Procedure: ESOPHAGOGASTRODUODENOSCOPY (EGD);  Surgeon: Celestino Cole., MD,FACG;  Location: [facility];  Service: Endoscopy;  Laterality: N/A;   INGUINAL HERNIA  REPAIR Right    LEFT HEART CATH AND CORONARY ANGIOGRAPHY N/A 07/29/2018   Procedure: LEFT HEART CATH AND CORONARY ANGIOGRAPHY;  Surgeon: Arty Binning, MD;  Location: [facility];  Service: Cardiovascular;  Laterality: N/A;       Home Medications    Prior to Admission medications   Medication Sig Start Date End Date Taking? Authorizing Provider  azelastine  (ASTELIN ) 0.1 % nasal spray Place 1 spray into both nostrils 2 (two) times daily. Use in each nostril as directed 11/16/23  Yes Corbin Dess, PA-C  lidocaine  (XYLOCAINE ) 2 % solution Use as directed 10 mLs in the mouth or throat every 3 (three) hours as needed. 11/16/23  Yes Corbin Dess, PA-C  promethazine -dextromethorphan (PROMETHAZINE -DM) 6.25-15 MG/5ML syrup Take 5 mLs by mouth 4 (four) times daily as needed. 11/16/23  Yes Corbin Dess, PA-C  albuterol  (VENTOLIN  HFA) 108 [phone] Base) MCG/ACT inhaler Inhale 2 puffs into the lungs every 6 (six) hours as needed for wheezing or shortness of breath.  11/18/18   [provider]  atorvastatin  (LIPITOR) 80 MG tablet Take 1 tablet (80 mg total) by mouth at bedtime. 02/12/19   Rai, Ripudeep K, MD  Chlorthalidone (THALITONE PO) Take by mouth daily. 25mg  every day    [provider]  cyclobenzaprine  (FLEXERIL ) 10 MG tablet Take 1 tablet (10 mg total) by mouth 2 (two) times daily as needed for muscle spasms. 05/13/23   Rosealee Concha, MD  ezetimibe  (ZETIA ) 10  MG tablet Take 1 tablet (10 mg total) by mouth daily. 04/21/21 04/21/22  Patwardhan, Kaye Parsons, MD  fluticasone furoate-vilanterol (BREO ELLIPTA) 200-25 MCG/INH AEPB Inhale into the lungs. 03/15/21   [provider]  HUMIRA, 2 PEN, 40 MG/0.4ML pen INJECT 40 MG Subcutaneous EVERY OTHER WEEK for 28 days    [provider]  HYDROcodone -acetaminophen  (NORCO/VICODIN) 5-325 MG tablet Take 2 tablets by mouth every 4 (four) hours as needed. 05/13/23   Rosealee Concha, MD  isosorbide  dinitrate  (ISORDIL ) 30 MG tablet Take 30 mg by mouth 4 (four) times daily.    [provider]  isosorbide  mononitrate (IMDUR ) 30 MG 24 hr tablet Take by mouth. 03/19/23   [provider]  ketorolac  (TORADOL ) 10 MG tablet Take 1 tablet (10 mg total) by mouth every 6 (six) hours as needed. 05/13/23   Rosealee Concha, MD  lidocaine  (LIDODERM ) 5 % Place 1 patch onto the skin daily. Remove & Discard patch within 12 hours or as directed by MD 05/13/23   Rosealee Concha, MD  metoprolol  succinate (TOPROL -XL) 100 MG 24 hr tablet Take 1 tablet (100 mg total) by mouth daily. Take with or immediately following a meal. 06/08/21   Patwardhan, Manish J, MD  metoprolol  succinate (TOPROL -XL) 50 MG 24 hr tablet TAKE 1 TABLET DAILY WITH OR IMMEDIATELY FOLLOWING A MEAL FOR FURTHER REFILLS, PATIENT NEEDS A APPT 03/19/23   [provider]  nitroGLYCERIN  (NITROSTAT ) 0.4 MG SL tablet Place 0.4 mg under the tongue every 5 (five) minutes as needed for chest pain.    [provider]  olmesartan (BENICAR) 40 MG tablet Take 40 mg by mouth daily.    [provider]  pantoprazole  (PROTONIX ) 40 MG tablet Take 1 tablet (40 mg total) by mouth daily with breakfast. 04/06/16   Esterwood, Amy S, PA-C  pantoprazole  (PROTONIX ) 40 MG tablet Take by mouth. 12/31/22   [provider]  triamcinolone  cream (KENALOG ) 0.5 % APPLY TO AFFECTED AREA TWICE A DAY 10/22/16   Aldine Humphreys, PA  triamcinolone  ointment (KENALOG ) 0.5 % Apply topically. 12/31/22   [provider]  valACYclovir (VALTREX) 1000 MG tablet Take 1,000 mg by mouth daily.    [provider]    Family History Family History  Problem Relation Age of Onset   Multiple sclerosis Mother    Diabetes Mother    Heart attack Father    Diabetes Sister    Heart disease Brother    Diabetes Brother    Heart disease Brother    Heart disease Brother    Heart disease Brother    Heart disease Brother    Heart disease Brother     Diabetes Maternal Uncle    Heart disease Maternal Grandmother    Colon cancer Neg Hx    Prostate cancer Neg Hx     Social History Social History   Tobacco Use   Smoking status: Former    Types: Cigars    Quit date: 2002    Years since quitting: 23.3   Smokeless tobacco: Never  Vaping Use   Vaping status: Never Used  Substance Use Topics   Alcohol use: Yes    Comment: wine- occasionally   Drug use: No     Allergies   Methocarbamol   Review of Systems Review of Systems PER HPI  Physical Exam Triage Vital Signs ED Triage Vitals  Encounter Vitals Group     BP 11/16/23 1253 124/80     Systolic BP Percentile --  Diastolic BP Percentile --      Pulse Rate 11/16/23 1253 71     Resp 11/16/23 1253 16     Temp 11/16/23 1253 98.2 F (36.8 C)     Temp Source 11/16/23 1253 Oral     SpO2 11/16/23 1253 96 %     Weight --      Height --      Head Circumference --      Peak Flow --      Pain Score 11/16/23 1255 9     Pain Loc --      Pain Education --      Exclude from Growth Chart --    No data found.  Updated Vital Signs BP 124/80 (BP Location: Right Arm)   Pulse 71   Temp 98.2 F (36.8 C) (Oral)   Resp 16   SpO2 96%   Visual Acuity Right Eye Distance:   Left Eye Distance:   Bilateral Distance:    Right Eye Near:   Left Eye Near:    Bilateral Near:     Physical Exam Vitals and nursing note reviewed.  Constitutional:      Appearance: He is well-developed.  HENT:     Head: Atraumatic.     Right Ear: External ear normal.     Left Ear: External ear normal.     Nose: Rhinorrhea present.     Mouth/Throat:     Pharynx: Posterior oropharyngeal erythema present. No oropharyngeal exudate.  Eyes:     Conjunctiva/sclera: Conjunctivae normal.     Pupils: Pupils are equal, round, and reactive to light.  Cardiovascular:     Rate and Rhythm: Normal rate and regular rhythm.  Pulmonary:     Effort: Pulmonary effort is normal. No respiratory distress.      Breath sounds: No wheezing or rales.  Musculoskeletal:        General: Normal range of motion.     Cervical back: Normal range of motion and neck supple.  Lymphadenopathy:     Cervical: No cervical adenopathy.  Skin:    General: Skin is warm and dry.  Neurological:     Mental Status: He is alert and oriented to person, place, and time.  Psychiatric:        Behavior: Behavior normal.      UC Treatments / Results  Labs (all labs ordered are listed, but only abnormal results are displayed) Labs Reviewed  POCT RAPID STREP A (OFFICE)  POC COVID19/FLU A&B COMBO    EKG   Radiology No results found.  Procedures Procedures (including critical care time)  Medications Ordered in UC Medications - No data to display  Initial Impression / Assessment and Plan / UC Course  I have reviewed the triage vital signs and the nursing notes.  Pertinent labs & imaging results that were available during my care of the patient were reviewed by me and considered in my medical decision making (see chart for details).     Vitals and exam overall reassuring, rapid flu strep and COVID all negative.  Suspect viral respiratory infection.  Treat with Phenergan  DM, Astelin , viscous lidocaine , supportive over-the-counter medications and home care.  Return for worsening symptoms.  Final Clinical Impressions(s) / UC Diagnoses   Final diagnoses:  Viral URI with cough   Discharge Instructions   None    ED Prescriptions     Medication Sig Dispense Auth. Provider   promethazine -dextromethorphan (PROMETHAZINE -DM) 6.25-15 MG/5ML syrup Take 5 mLs by mouth 4 (four)  times daily as needed. 100 mL Corbin Dess, PA-C   azelastine  (ASTELIN ) 0.1 % nasal spray Place 1 spray into both nostrils 2 (two) times daily. Use in each nostril as directed 30 mL Corbin Dess, PA-C   lidocaine  (XYLOCAINE ) 2 % solution Use as directed 10 mLs in the mouth or throat every 3 (three) hours as needed. 100 mL  Corbin Dess, [place]      PDMP not reviewed this encounter.   Corbin Dess, [place] 11/16/23 1345

## 2023-12-10 ENCOUNTER — Other Ambulatory Visit: Payer: Self-pay | Admitting: Family Medicine

## 2024-03-11 ENCOUNTER — Emergency Department (HOSPITAL_BASED_OUTPATIENT_CLINIC_OR_DEPARTMENT_OTHER)
Admission: EM | Admit: 2024-03-11 | Discharge: 2024-03-11 | Disposition: A | Discharge status: Home / Self Care | Attending: Emergency Medicine | Admitting: Emergency Medicine

## 2024-03-11 ENCOUNTER — Emergency Department (HOSPITAL_BASED_OUTPATIENT_CLINIC_OR_DEPARTMENT_OTHER): Admitting: Radiology

## 2024-03-11 ENCOUNTER — Other Ambulatory Visit (HOSPITAL_BASED_OUTPATIENT_CLINIC_OR_DEPARTMENT_OTHER): Payer: Self-pay

## 2024-03-11 ENCOUNTER — Other Ambulatory Visit: Payer: Self-pay

## 2024-03-11 DIAGNOSIS — I10 Essential (primary) hypertension: Secondary | ICD-10-CM

## 2024-03-11 DIAGNOSIS — U071 COVID-19: Secondary | ICD-10-CM

## 2024-03-11 DIAGNOSIS — R059 Cough, unspecified: Secondary | ICD-10-CM | POA: Diagnosis present

## 2024-03-11 LAB — CBC
HCT: 49.6 % (ref 39.0–52.0)
Hemoglobin: 16.7 g/dL (ref 13.0–17.0)
MCH: 29.5 pg (ref 26.0–34.0)
MCHC: 33.7 g/dL (ref 30.0–36.0)
MCV: 87.5 fL (ref 80.0–100.0)
Platelets: 196 K/uL (ref 150–400)
RBC: 5.67 MIL/uL (ref 4.22–5.81)
RDW: 14.3 % (ref 11.5–15.5)
WBC: 6.1 K/uL (ref 4.0–10.5)
nRBC: 0 % (ref 0.0–0.2)

## 2024-03-11 LAB — BASIC METABOLIC PANEL WITH GFR
Anion gap: 14 (ref 5–15)
BUN: 19 mg/dL (ref 6–20)
CO2: 25 mmol/L (ref 22–32)
Calcium: 9.9 mg/dL (ref 8.9–10.3)
Chloride: 98 mmol/L (ref 98–111)
Creatinine, Ser: 1.51 mg/dL — ABNORMAL HIGH (ref 0.61–1.24)
GFR, Estimated: 55 mL/min — ABNORMAL LOW (ref >60)
Glucose, Bld: 95 mg/dL (ref 70–99)
Potassium: 4.2 mmol/L (ref 3.5–5.1)
Sodium: 137 mmol/L (ref 135–145)

## 2024-03-11 LAB — RESP PANEL BY RT-PCR (RSV, FLU A&B, COVID)  RVPGX2
Influenza A by PCR: NEGATIVE
Influenza B by PCR: NEGATIVE
Resp Syncytial Virus by PCR: NEGATIVE
SARS Coronavirus 2 by RT PCR: POSITIVE — AB

## 2024-03-11 LAB — TROPONIN T, HIGH SENSITIVITY: Troponin T High Sensitivity: 17 ng/L (ref 0–19)

## 2024-03-11 LAB — HEMOGLOBIN A1C
Hgb A1c MFr Bld: 6.5 % — ABNORMAL HIGH (ref 4.8–5.6)
Mean Plasma Glucose: 139.85 mg/dL

## 2024-03-11 MED ORDER — PAXLOVID (150/100) 10 X 150 MG & 10 X 100MG PO TBPK
2.0000 | ORAL_TABLET | Freq: Two times a day (BID) | ORAL | 0 refills | Status: AC
  Filled 2024-03-11: qty 20, 5d supply, fill #0

## 2024-03-11 MED ORDER — SODIUM CHLORIDE 0.9 % IV BOLUS
500.0000 mL | Freq: Once | INTRAVENOUS | Status: AC
  Administered 2024-03-11 (×2): 500 mL via INTRAVENOUS

## 2024-03-11 NOTE — ED Provider Notes (Signed)
 [facility] AT [facility] Provider Note   CSN: [id number] Arrival date & time: 03/11/24  1454     Patient presents with: Fatigue and Chest Pain   Jacob Cannon is a 53 y.o. male.    Chest Pain Patient presents with URI symptoms cough.  Has recently been on a trip came back.  Has had cough and with no real sputum production.  Myalgias and left-sided chest aching. Reportedly had had some previous swelling in his left leg that is resolved.  Also some fatigue.  States he feels he is dehydrated.  States he has been urinating frequently.  States he has to go more often and has been getting up at night also.  This has been going for weeks.     Prior to Admission medications   Medication Sig Start Date End Date Taking? Authorizing Provider  nirmatrelvir /ritonavir , renal dosing, (PAXLOVID , 150/100,) 10 x 150 MG & 10 x 100MG  TBPK Take 2 tablets by mouth 2 (two) times daily for 5 days. 03/11/24 03/16/24 Yes Patsey Lot, MD  albuterol  (VENTOLIN  HFA) 108 (90 Base) MCG/ACT inhaler Inhale 2 puffs into the lungs every 6 (six) hours as needed for wheezing or shortness of breath.  11/18/18   [provider]  atorvastatin  (LIPITOR) 80 MG tablet Take 1 tablet (80 mg total) by mouth at bedtime. 02/12/19   Rai, Ripudeep K, MD  azelastine  (ASTELIN ) 0.1 % nasal spray Place 1 spray into both nostrils 2 (two) times daily. Use in each nostril as directed 11/16/23   Stuart Vernell Norris, PA-C  Chlorthalidone (THALITONE PO) Take by mouth daily. 25mg  every day    [provider]  cyclobenzaprine  (FLEXERIL ) 10 MG tablet Take 1 tablet (10 mg total) by mouth 2 (two) times daily as needed for muscle spasms. 05/13/23   Jerrol Agent, MD  ezetimibe  (ZETIA ) 10 MG tablet Take 1 tablet (10 mg total) by mouth daily. 04/21/21 04/21/22  Patwardhan, Newman PARAS, MD  fluticasone furoate-vilanterol (BREO ELLIPTA) 200-25 MCG/INH AEPB Inhale into the lungs. 03/15/21   [provider]  HUMIRA, 2 PEN, 40 MG/0.4ML pen INJECT 40 MG Subcutaneous EVERY OTHER WEEK for 28 days    [provider]  HYDROcodone -acetaminophen  (NORCO/VICODIN) 5-325 MG tablet Take 2 tablets by mouth every 4 (four) hours as needed. 05/13/23   Jerrol Agent, MD  isosorbide  dinitrate (ISORDIL ) 30 MG tablet Take 30 mg by mouth 4 (four) times daily.    [provider]  isosorbide  mononitrate (IMDUR ) 30 MG 24 hr tablet Take by mouth. 03/19/23   [provider]  ketorolac  (TORADOL ) 10 MG tablet Take 1 tablet (10 mg total) by mouth every 6 (six) hours as needed. 05/13/23   Jerrol Agent, MD  lidocaine  (LIDODERM ) 5 % Place 1 patch onto the skin daily. Remove & Discard patch within 12 hours or as directed by MD 05/13/23   Jerrol Agent, MD  lidocaine  (XYLOCAINE ) 2 % solution Use as directed 10 mLs in the mouth or throat every 3 (three) hours as needed. 11/16/23   Stuart Vernell Norris, PA-C  metoprolol  succinate (TOPROL -XL) 100 MG 24 hr tablet Take 1 tablet (100 mg total) by mouth daily. Take with or immediately following a meal. 06/08/21   Patwardhan, Manish J, MD  metoprolol  succinate (TOPROL -XL) 50 MG 24 hr tablet TAKE 1 TABLET DAILY WITH OR IMMEDIATELY FOLLOWING A MEAL FOR FURTHER REFILLS, PATIENT NEEDS A APPT 03/19/23   [provider]  nitroGLYCERIN  (NITROSTAT ) 0.4 MG SL tablet Place  0.4 mg under the tongue every 5 (five) minutes as needed for chest pain.    [provider]  olmesartan (BENICAR) 40 MG tablet Take 40 mg by mouth daily.    [provider]  pantoprazole  (PROTONIX ) 40 MG tablet Take 1 tablet (40 mg total) by mouth daily with breakfast. 04/06/16   Esterwood, Amy S, PA-C  pantoprazole  (PROTONIX ) 40 MG tablet Take by mouth. 12/31/22   [provider]  promethazine -dextromethorphan (PROMETHAZINE -DM) 6.25-15 MG/5ML syrup Take 5 mLs by mouth 4 (four) times daily as needed. 11/16/23   Stuart Vernell Norris, PA-C  triamcinolone  cream  (KENALOG ) 0.5 % APPLY TO AFFECTED AREA TWICE A DAY 10/22/16   Juliane Che, PA  triamcinolone  ointment (KENALOG ) 0.5 % Apply topically. 12/31/22   [provider]  valACYclovir (VALTREX) 1000 MG tablet Take 1,000 mg by mouth daily.    [provider]    Allergies: Methocarbamol    Review of Systems  Cardiovascular:  Positive for chest pain.    Updated Vital Signs BP 134/80 (BP Location: Right Arm)   Pulse 88   Temp 99.1 F (37.3 C) (Oral)   Resp 16   Ht 5' 5 (1.651 m)   Wt 98.4 kg   SpO2 100%   BMI 36.11 kg/m   Physical Exam Vitals and nursing note reviewed.  Cardiovascular:     Rate and Rhythm: Regular rhythm.  Pulmonary:     Breath sounds: No wheezing, rhonchi or rales.  Chest:     Chest wall: No tenderness.  Abdominal:     Tenderness: There is no abdominal tenderness.  Musculoskeletal:     Right lower leg: No edema.     Left lower leg: No edema.  Skin:    Capillary Refill: Capillary refill takes less than 2 seconds.  Neurological:     Mental Status: He is alert.     (all labs ordered are listed, but only abnormal results are displayed) Labs Reviewed  RESP PANEL BY RT-PCR (RSV, FLU A&B, COVID)  [id number] - Abnormal; Notable for the following components:      Result Value   SARS Coronavirus 2 by RT PCR POSITIVE (*)    All other components within normal limits  BASIC METABOLIC PANEL WITH GFR - Abnormal; Notable for the following components:   Creatinine, Ser 1.51 (*)    GFR, Estimated 55 (*)    All other components within normal limits  HEMOGLOBIN A1C - Abnormal; Notable for the following components:   Hgb A1c MFr Bld 6.5 (*)    All other components within normal limits  CBC  URINALYSIS, ROUTINE W REFLEX MICROSCOPIC  TROPONIN T, HIGH SENSITIVITY  TROPONIN T, HIGH SENSITIVITY    EKG: EKG Interpretation Date/Time:  Wednesday March 11 2024 15:08:30 EDT Ventricular Rate:  86 PR Interval:  143 QRS Duration:  93 QT Interval:  379 QTC  Calculation: 454 R Axis:   103  Text Interpretation: Sinus rhythm Probable left atrial enlargement Probable anterior infarct, age indeterminate No significant change since last tracing Confirmed by Patsey Lot [phone]) on 03/11/2024 3:25:55 PM  Radiology: [facility] Chest 2 View Result Date: 03/11/2024 CLINICAL DATA:  Chest pain. EXAM: CHEST - 2 VIEW COMPARISON:  02/15/2023 FINDINGS: The cardiomediastinal contours are normal. The lungs are clear. Pulmonary vasculature is normal. No consolidation, pleural effusion, or pneumothorax. No acute osseous abnormalities are seen. IMPRESSION: No active cardiopulmonary disease. Electronically Signed   By: Andrea Gasman M.D.   On: 03/11/2024 16:44  Procedures   Medications Ordered in the ED  sodium chloride  0.9 % bolus 500 mL (500 mLs Intravenous New Bag/Given 03/11/24 1646)                                    Medical Decision Making Amount and/or Complexity of Data Reviewed Labs: ordered. Radiology: ordered.  Risk Prescription drug management.   Patient with URI symptoms and cough.  Also some right left-sided chest pain.  Particular with the cough and myalgias I think cardiac causes less likely.  Also pulmonary embolism felt less likely.  No current swelling the legs.  Did have recent travel however.  But also has had urinary frequency.  States both going more often and more urgent.  Does not cardiac history and have reviewed his cardiology note.  Reviewed hemoglobin A1c from a year ago and it was mildly elevated.  New onset diabetes considered with the urinary symptoms.  Will get blood work.  Will get troponin.  Will get x-ray and viral testing.  Patient did come back positive for COVID.  I think this is likely the cause of feeling bad. No pneumonia on x-ray.  Will treat with Paxlovid  since has had around 2 days of symptoms and is high risk.  Will give renal dosing.  Creatinine slightly above baseline.  Discussed with pharmacist and  will hold his statin and half his metoprolol  while he is on the medicine.     Final diagnoses:  COVID-19    ED Discharge Orders          Ordered    nirmatrelvir /ritonavir , renal dosing, (PAXLOVID , 150/100,) 10 x 150 MG & 10 x 100MG  TBPK  2 times daily       Note to Pharmacy: Take 2 tablets by mouth 2 (two) times daily for 5 days. Dosage for moderate renal impairment (eGFR >/= 30 to <60 mL/min): 150 mg nirmatrelvir  (one 150 mg tablet) with 100 mg ritonavir  (one 100 mg tablet), with both tablets taken together twice daily for 5 days. Not recommended if eGFR < 30 mL/min.   03/11/24 1711               Patsey Lot, MD 03/11/24 2241

## 2024-03-11 NOTE — Discharge Instructions (Addendum)
 Hold your atorvastatin  while you are on the medicine.  Also take only half of your metoprolol .

## 2024-03-11 NOTE — ED Triage Notes (Signed)
 Pt POV reporting congestion, runny nose, fatigue, and L side chest heaviness, began a few days ago. Hx L ventricle hypertrophy and HTN, +smoker. Denies SOB.
